# Patient Record
Sex: Female | Born: 1975 | Race: White | Hispanic: No | Marital: Single | Smoking: Never smoker
Health system: Southern US, Community
[De-identification: ages and names within clinical notes are randomized; demographics above are authoritative.]

## PROBLEM LIST (undated history)

## (undated) DIAGNOSIS — I2699 Other pulmonary embolism without acute cor pulmonale: Secondary | ICD-10-CM

## (undated) DIAGNOSIS — E785 Hyperlipidemia, unspecified: Secondary | ICD-10-CM

## (undated) DIAGNOSIS — E282 Polycystic ovarian syndrome: Secondary | ICD-10-CM

## (undated) DIAGNOSIS — I10 Essential (primary) hypertension: Secondary | ICD-10-CM

## (undated) DIAGNOSIS — E669 Obesity, unspecified: Secondary | ICD-10-CM

## (undated) DIAGNOSIS — F32A Depression, unspecified: Secondary | ICD-10-CM

## (undated) DIAGNOSIS — F329 Major depressive disorder, single episode, unspecified: Secondary | ICD-10-CM

## (undated) HISTORY — DX: Depression, unspecified: F32.A

## (undated) HISTORY — PX: WISDOM TOOTH EXTRACTION: SHX21

## (undated) HISTORY — DX: Hyperlipidemia, unspecified: E78.5

## (undated) HISTORY — DX: Polycystic ovarian syndrome: E28.2

## (undated) HISTORY — DX: Essential (primary) hypertension: I10

## (undated) HISTORY — DX: Obesity, unspecified: E66.9

## (undated) HISTORY — PX: CHOLECYSTECTOMY: SHX55

## (undated) HISTORY — PX: BREAST SURGERY: SHX581

## (undated) HISTORY — DX: Other pulmonary embolism without acute cor pulmonale: I26.99

## (undated) HISTORY — DX: Major depressive disorder, single episode, unspecified: F32.9

---

## 1999-10-02 ENCOUNTER — Emergency Department (HOSPITAL_COMMUNITY): Admission: EM | Admit: 1999-10-02 | Discharge: 1999-10-03 | Payer: Self-pay | Admitting: *Deleted

## 1999-10-05 ENCOUNTER — Observation Stay (HOSPITAL_COMMUNITY): Admission: RE | Admit: 1999-10-05 | Discharge: 1999-10-06 | Payer: Self-pay | Admitting: Surgery

## 1999-10-05 ENCOUNTER — Encounter (INDEPENDENT_AMBULATORY_CARE_PROVIDER_SITE_OTHER): Payer: Self-pay | Admitting: Specialist

## 2001-03-04 ENCOUNTER — Other Ambulatory Visit: Admission: RE | Admit: 2001-03-04 | Discharge: 2001-03-04 | Payer: Self-pay | Admitting: *Deleted

## 2001-06-10 ENCOUNTER — Emergency Department (HOSPITAL_COMMUNITY): Admission: EM | Admit: 2001-06-10 | Discharge: 2001-06-10 | Payer: Self-pay | Admitting: Emergency Medicine

## 2001-06-10 ENCOUNTER — Encounter: Payer: Self-pay | Admitting: Emergency Medicine

## 2005-04-12 ENCOUNTER — Other Ambulatory Visit: Admission: RE | Admit: 2005-04-12 | Discharge: 2005-04-12 | Payer: Self-pay | Admitting: *Deleted

## 2006-03-26 ENCOUNTER — Other Ambulatory Visit: Admission: RE | Admit: 2006-03-26 | Discharge: 2006-03-26 | Payer: Self-pay | Admitting: *Deleted

## 2009-04-05 LAB — CONVERTED CEMR LAB
HDL: 27 mg/dL
LDL Cholesterol: 70 mg/dL

## 2009-04-12 LAB — CONVERTED CEMR LAB: Pap Smear: NORMAL

## 2009-04-28 ENCOUNTER — Encounter: Payer: Self-pay | Admitting: Family Medicine

## 2009-04-29 ENCOUNTER — Encounter: Payer: Self-pay | Admitting: Family Medicine

## 2009-05-02 ENCOUNTER — Encounter: Payer: Self-pay | Admitting: Family Medicine

## 2009-07-08 ENCOUNTER — Ambulatory Visit: Payer: Self-pay | Admitting: Family Medicine

## 2009-07-08 DIAGNOSIS — E282 Polycystic ovarian syndrome: Secondary | ICD-10-CM

## 2009-07-08 DIAGNOSIS — F331 Major depressive disorder, recurrent, moderate: Secondary | ICD-10-CM | POA: Insufficient documentation

## 2009-07-08 DIAGNOSIS — E1169 Type 2 diabetes mellitus with other specified complication: Secondary | ICD-10-CM | POA: Insufficient documentation

## 2009-07-08 DIAGNOSIS — E119 Type 2 diabetes mellitus without complications: Secondary | ICD-10-CM | POA: Insufficient documentation

## 2009-07-08 DIAGNOSIS — E1129 Type 2 diabetes mellitus with other diabetic kidney complication: Secondary | ICD-10-CM

## 2009-07-08 DIAGNOSIS — F329 Major depressive disorder, single episode, unspecified: Secondary | ICD-10-CM | POA: Insufficient documentation

## 2009-07-12 LAB — CONVERTED CEMR LAB
BUN: 13 mg/dL (ref 6–23)
CO2: 28 meq/L (ref 19–32)
Calcium: 8.7 mg/dL (ref 8.4–10.5)
Chloride: 102 meq/L (ref 96–112)
Creatinine, Ser: 0.7 mg/dL (ref 0.4–1.2)
GFR calc non Af Amer: 104.44 mL/min (ref >60)
Glucose, Bld: 229 mg/dL — ABNORMAL HIGH (ref 70–99)
Hgb A1c MFr Bld: 10.1 % — ABNORMAL HIGH (ref 4.6–6.5)
Potassium: 4.6 meq/L (ref 3.5–5.1)
Sodium: 138 meq/L (ref 135–145)

## 2009-07-19 ENCOUNTER — Ambulatory Visit: Payer: Self-pay | Admitting: Psychology

## 2009-08-03 ENCOUNTER — Encounter: Payer: Self-pay | Admitting: Family Medicine

## 2009-08-03 ENCOUNTER — Ambulatory Visit: Payer: Self-pay | Admitting: Psychology

## 2009-08-03 ENCOUNTER — Ambulatory Visit: Payer: Self-pay | Admitting: Family Medicine

## 2009-08-10 ENCOUNTER — Ambulatory Visit: Payer: Self-pay | Admitting: Psychology

## 2009-08-10 ENCOUNTER — Ambulatory Visit: Payer: Self-pay | Admitting: Family Medicine

## 2009-08-23 ENCOUNTER — Ambulatory Visit: Payer: Self-pay | Admitting: Psychology

## 2009-09-07 ENCOUNTER — Ambulatory Visit: Payer: Self-pay | Admitting: Psychology

## 2009-09-22 ENCOUNTER — Telehealth: Payer: Self-pay | Admitting: Family Medicine

## 2009-09-29 ENCOUNTER — Encounter: Payer: Self-pay | Admitting: Family Medicine

## 2009-10-04 ENCOUNTER — Ambulatory Visit: Payer: Self-pay | Admitting: Psychology

## 2009-10-20 ENCOUNTER — Ambulatory Visit: Payer: Self-pay | Admitting: Family Medicine

## 2009-10-21 LAB — CONVERTED CEMR LAB: Hgb A1c MFr Bld: 6.4 % (ref 4.6–6.5)

## 2009-10-24 ENCOUNTER — Encounter: Payer: Self-pay | Admitting: Family Medicine

## 2009-10-24 LAB — CONVERTED CEMR LAB: TSH: 3.21 microintl units/mL (ref 0.35–5.50)

## 2009-10-31 ENCOUNTER — Encounter: Payer: Self-pay | Admitting: Family Medicine

## 2009-11-08 ENCOUNTER — Telehealth: Payer: Self-pay | Admitting: Family Medicine

## 2009-11-08 ENCOUNTER — Ambulatory Visit: Payer: Self-pay | Admitting: Psychology

## 2009-11-28 ENCOUNTER — Telehealth: Payer: Self-pay | Admitting: Family Medicine

## 2009-12-13 ENCOUNTER — Ambulatory Visit: Payer: Self-pay | Admitting: Psychology

## 2010-01-24 ENCOUNTER — Other Ambulatory Visit: Payer: Self-pay | Admitting: Family Medicine

## 2010-01-24 ENCOUNTER — Ambulatory Visit
Admission: RE | Admit: 2010-01-24 | Discharge: 2010-01-24 | Payer: Self-pay | Source: Home / Self Care | Attending: Family Medicine | Admitting: Family Medicine

## 2010-01-24 DIAGNOSIS — R5383 Other fatigue: Secondary | ICD-10-CM

## 2010-01-24 DIAGNOSIS — R5381 Other malaise: Secondary | ICD-10-CM | POA: Insufficient documentation

## 2010-01-24 LAB — CBC WITH DIFFERENTIAL/PLATELET
Basophils Absolute: 0 10*3/uL (ref 0.0–0.1)
Basophils Relative: 0.5 % (ref 0.0–3.0)
Eosinophils Absolute: 0.2 10*3/uL (ref 0.0–0.7)
Eosinophils Relative: 2 % (ref 0.0–5.0)
HCT: 29.6 % — ABNORMAL LOW (ref 36.0–46.0)
Hemoglobin: 9.6 g/dL — ABNORMAL LOW (ref 12.0–15.0)
Lymphocytes Relative: 24.5 % (ref 12.0–46.0)
Lymphs Abs: 1.9 10*3/uL (ref 0.7–4.0)
MCHC: 32.4 g/dL (ref 30.0–36.0)
MCV: 70 fl — ABNORMAL LOW (ref 78.0–100.0)
Monocytes Absolute: 0.6 10*3/uL (ref 0.1–1.0)
Monocytes Relative: 8.3 % (ref 3.0–12.0)
Neutro Abs: 4.9 10*3/uL (ref 1.4–7.7)
Neutrophils Relative %: 64.7 % (ref 43.0–77.0)
Platelets: 334 10*3/uL (ref 150.0–400.0)
RBC: 4.23 Mil/uL (ref 3.87–5.11)
RDW: 19.4 % — ABNORMAL HIGH (ref 11.5–14.6)
WBC: 7.6 10*3/uL (ref 4.5–10.5)

## 2010-01-24 LAB — HEMOGLOBIN A1C: Hgb A1c MFr Bld: 6.3 % (ref 4.6–6.5)

## 2010-01-24 LAB — BASIC METABOLIC PANEL
BUN: 14 mg/dL (ref 6–23)
CO2: 29 mEq/L (ref 19–32)
Calcium: 9 mg/dL (ref 8.4–10.5)
Chloride: 104 mEq/L (ref 96–112)
Creatinine, Ser: 0.7 mg/dL (ref 0.4–1.2)
GFR: 100.69 mL/min (ref >60.00)
Glucose, Bld: 101 mg/dL — ABNORMAL HIGH (ref 70–99)
Potassium: 4.6 mEq/L (ref 3.5–5.1)
Sodium: 138 mEq/L (ref 135–145)

## 2010-01-24 LAB — TSH: TSH: 3.4 u[IU]/mL (ref 0.35–5.50)

## 2010-02-02 NOTE — Letter (Signed)
Summary: Generic Letter  Kristy Conrad at [facility]  [address] [place], [place] [address]   Phone: [phone]  Fax: [phone]    10/24/2009  Kristy Conrad [address] [place], [place]  [address]  Dear Ms. Dareen Piano,    We have received your lab results and Dr. Dayton Martes says that your thyroid function is normal.  A copy of these labs were faxed to Dr. Elpidio Eric office.         Sincerely,       Linde Gillis CMA (AAMA)for Dr. Ruthe Mannan

## 2010-02-02 NOTE — Progress Notes (Signed)
Summary: Rx Bupropion  Phone Note Refill Request Call back at [phone] Message [facility]:  [facility] on September 22, 2009 8:32 AM  Refills Requested: Medication #1:  WELLBUTRIN SR 150 MG XR12H-TAB 1 tab by mouth every morning.   Last Refilled: 07/08/2009 Received E-script request please advise.     Method Requested: Telephone to Pharmacy Initial call taken by: Linde Gillis CMA Duncan Dull),  September 22, 2009 8:33 AM    Prescriptions: WELLBUTRIN SR 150 MG XR12H-TAB (BUPROPION HCL) 1 tab by mouth every morning.  #30 x 1   Entered and Authorized by:   Ruthe Mannan MD   Signed by:   Ruthe Mannan MD on 09/22/2009   Method used:   [facility] to        [facility] [phone]* (retail)       [address] [place], [place]  [address]       Ph: [phone] or [phone]       Fax: [phone]   RxID:   [id number]

## 2010-02-02 NOTE — Letter (Signed)
Summary: [facility]  [facility]   Imported By: Lanelle Bal 05/16/2009 10:32:53  _____________________________________________________________________  External Attachment:    Type:   Image     Comment:   External Document

## 2010-02-02 NOTE — Assessment & Plan Note (Signed)
Summary: 2 month f/u /alc   Vital Signs:  Patient profile:   35 year old female Height:      64.5 inches Weight:      370.25 pounds BMI:     62.80 Temp:     98.1 degrees F oral Pulse rate:   76 / minute Pulse rhythm:   regular BP sitting:   112 / 72  (right arm) Cuff size:   large  Vitals Entered By: Linde Gillis CMA Duncan Dull) (January 24, 2010 9:44 AM) CC: 2 month follow up for diabetes   History of Present Illness: 35 yo here for follow up.   1.  DM- a1c in October was 6.4 (10.1 prior to that).  On Metformin 500 mg bid.    Has been seeing a nutritionist, very "eye opening."  Stopped drinking large sweet tea- 4 large glasses per day.   Weight down another 7 pounds since last office visit CBGs 95-146, checking them two times a day. Admits to slipping a little on her diet over the holidays.    2.  Fatigue- has noticed she is more fatigued and SOB lately. Sometimes DOE but sometimes SOB at rest.  Has been occuring for several months.  She think she has asthma.  3.  Menorrhagia- followed by Dr. Ernestina Penna.  Now on Lysteda which has helped significantly with her bleeding.    Current Medications (verified): 1)  Metformin Hcl 500 Mg Tabs (Metformin Hcl) .Marland Kitchen.. 1 Tab By Mouth Two Times A Day 2)  Wellbutrin Sr 150 Mg Xr12h-Tab (Bupropion Hcl) .Marland Kitchen.. 1 Tab By Mouth Every Morning. 3)  Onetouch Ultra 2 W/device Kit (Blood Glucose Monitoring Suppl) 4)  Onetouch Ultra Test  Strp (Glucose Blood) .... Use As Directed 5)  Onetouch Ultrasoft Lancets  Misc (Lancets) .... Use As Directed 6)  [facility]vs Acid Reducer 10 Mg Tabs (Famotidine) .... Take One Tablet By Mouth Daily 7)  Lysteda 650 Mg Tabs (Tranexamic Acid) .... Take Two Tablets Three Times A Day During Menstral Cycle 8)  Proair Hfa 108 (90 Base) Mcg/act  Aers (Albuterol Sulfate) .... 2 Inh Q4h As Needed Shortness of Breath  Allergies: 1)  ! 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 Eyes:  Denies blurring. ENT:  Denies difficulty swallowing. CV:  Denies chest pain or discomfort. Resp:  Complains of shortness of breath; denies sputum productive and wheezing. GI:  Denies abdominal pain and change in bowel habits. MS:  Denies joint pain, joint redness, and joint swelling. Psych:  Denies anxiety and depression.  Physical Exam  General:  morbidly obese, NAD weight down 7 pounds Mouth:  good dentition.   Neck:  No deformities, masses, or tenderness noted. Lungs:  Normal respiratory effort, chest expands symmetrically. Lungs are clear to auscultation, no crackles or wheezes. Heart:  Normal rate and regular rhythm. S1 and S2 normal without gallop, murmur, click, rub or other extra sounds. Extremities:  no edema Neurologic:  No cranial nerve deficits noted. Station and gait are normal. Plantar reflexes are down-going bilaterally. DTRs  are symmetrical throughout. Sensory, motor and coordinative functions appear intact. Psych:  Cognition and judgment appear intact. Alert and cooperative with normal attention span and concentration. No apparent delusions, illusions, hallucinations   Impression & Recommendations:  Problem # 1:  FATIGUE (ICD-780.79) Assessment New Likely multifactorial- deconditioning, obesity. She did have significant menstrual bleeding, will therefore check CBC and other labs to rule out other contributing factors. Anxiety is a large componenet as well, will give her a proair inhaler to take with her as  a security in case she does become short of breath. Lungs clear on exam. Orders: Venipuncture (04540) TLB-BMP (Basic Metabolic Panel-BMET) (80048-METABOL) TLB-CBC Platelet - w/Differential (85025-CBCD) TLB-TSH (Thyroid Stimulating Hormone) (84443-TSH)  Problem # 2:  DIABETES MELLITUS, TYPE II (ICD-250.00) Assessment: Unchanged recheck a1c today. Her updated medication list for this problem includes:    Metformin Hcl 500 Mg Tabs (Metformin hcl) .Marland Kitchen... 1 tab by mouth two times a day  Complete Medication List: 1)  Metformin Hcl 500 Mg Tabs (Metformin hcl) .Marland Kitchen.. 1 tab by mouth two times a day 2)  Wellbutrin Sr 150 Mg Xr12h-tab (Bupropion hcl) .Marland Kitchen.. 1 tab by mouth every morning. 3)  Onetouch Ultra 2 W/device Kit (Blood glucose monitoring suppl) 4)  Onetouch Ultra Test Strp (Glucose blood) .... Use as directed 5)  Onetouch Ultrasoft Lancets Misc (Lancets) .... Use as directed 6)  [facility]vs Acid Reducer 10 Mg Tabs (Famotidine) .... Take one tablet by mouth daily 7)  Lysteda 650 Mg Tabs (Tranexamic acid) .... Take two tablets three times a day during menstral cycle 8)  Proair Hfa 108 (90 Base) Mcg/act Aers (Albuterol sulfate) .... 2 inh q4h as needed shortness of breath  Other Orders: TLB-A1C / Hgb A1C (Glycohemoglobin) (83036-A1C) Prescriptions: PROAIR HFA 108 (90 BASE) MCG/ACT  AERS (ALBUTEROL SULFATE) 2 inh q4h as needed shortness of breath  #1 x 1   Entered and Authorized by:   Ruthe Mannan MD   Signed by:   Ruthe Mannan MD on 01/24/2010   Method used:   Electronically to        [facility] [address]* (retail)       [address] [place], [place]  [address]       Ph: [phone]       Fax: [phone]   RxID:   [id number]    Orders Added: 1)  Venipuncture [72536] 2)  TLB-BMP (Basic Metabolic Panel-BMET) [80048-METABOL] 3)  TLB-CBC Platelet - w/Differential [85025-CBCD] 4)  TLB-TSH (Thyroid Stimulating Hormone) [84443-TSH] 5)  TLB-A1C / Hgb A1C  (Glycohemoglobin) [83036-A1C] 6)  Est. Patient Level IV [64403]    Current Allergies (reviewed today): ! MORPHINE

## 2010-02-02 NOTE — Assessment & Plan Note (Signed)
Summary: NEW PT TO ESTABH/DLO   Vital Signs:  Patient profile:   35 year old female Height:      64.5 inches Weight:      392.50 pounds BMI:     66.57 Temp:     98.8 degrees F oral Pulse rate:   80 / minute Pulse rhythm:   regular BP sitting:   120 / 90  (right arm) Cuff size:   large  Vitals Entered By: Linde Gillis CMA Duncan Dull) (July 08, 2009 10:54 AM) CC: new patient, establish care   History of Present Illness: 35 yo here to establish care.  Has not had a PCP since she was a child. Sent here by OBGYN, Dr. Ernestina Penna.  Dr. Ernestina Penna saw her for a pap this year in April, found her to be a diabetic, ?PCOS.  1.  DM- a1c in April was 9.7.  She was given prescription for Metformin, but she never took it.  States that she wanted to see her primary doctor first.  Was given a prescription for a meter and strips but she never filled them either.  Admits to eating large portions and drinking sugary drinks.  Recently started drinking unsweeted tea.  Does admit to being thirsty, urinating frequenty and having frequent vaginal yeast infections.  2.  Depression- lost both of her parents in the same year 11 year ago.  Since then, has had episodes of depression.  Was seeing Dr. Omelia Blackwater and placed on Wellbutrin but she felt better and stopped taking it.  Lately, she has had more crying spells, feelings of anhedonia.  No SI or HI.  3.  ?PCOS- has always had issues with very heavy and irregular periods.  Not in a relationship but was not concerned about fertility risks until now.  Was given prescription for OCP to regulate her periods but she never took them.    Preventive Screening-Counseling & Management  Alcohol-Tobacco     Smoking Status: never  Current Medications (verified): 1)  Metformin Hcl 500 Mg Tabs (Metformin Hcl) .Marland Kitchen.. 1 Tab By Mouth Two Times A Day 2)  Wellbutrin Sr 150 Mg Xr12h-Tab (Bupropion Hcl) .Marland Kitchen.. 1 Tab By Mouth Every Morning. 3)  Onetouch Ultra 2 W/device Kit (Blood Glucose  Monitoring Suppl) 4)  Onetouch Ultra Test  Strp (Glucose Blood) .... Use As Directed 5)  Onetouch Ultrasoft Lancets  Misc (Lancets) .... Use As Directed  Allergies (verified): 1)  ! 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 Eyes:  Denies blurring. ENT:  Denies difficulty swallowing. CV:  Denies chest pain or discomfort. Resp:  Denies shortness of breath. GI:  Denies abdominal pain and change in bowel habits. GU:  Complains of abnormal vaginal bleeding. MS:  Complains of joint pain; denies joint redness, joint swelling, and loss of strength. Derm:  Denies rash. Neuro:  Denies headaches, tingling, tremors, visual disturbances, and weakness. Psych:  Complains  of depression and easily tearful; denies anxiety, easily angered, irritability, mental problems, panic attacks, sense of great danger, suicidal thoughts/plans, thoughts of violence, unusual visions or sounds, and thoughts /plans of harming others. Endo:  Denies cold intolerance and heat intolerance. Heme:  Denies abnormal bruising.  Physical Exam  General:  morbidly obese, NAD Eyes:  vision grossly intact.   Ears:  R ear normal and L ear normal.   Nose:  no external deformity.   Mouth:  good dentition.   Psych:  Cognition and judgment appear intact. Alert and cooperative with normal attention span and  concentration. No apparent delusions, illusions, hallucinations   Impression & Recommendations:  Problem # 1:  DIABETES MELLITUS, TYPE II (ICD-250.00) Assessment New Will recheck a1c today. Time spent with patient 45 minutes, more than 50% of this time was spent counseling patient on diabetes, management and diet along with depression. Given diabetic friendly diet along with one touch ultra 2 kit.  Discussed how to use.  Would advise checking her sugars daily at first, eventually a few times a week.   Will start Metformin 500 mg two times a day and have her follow up in one month. Will aslo refer for diabetic nutrition counselling. Her updated medication list for this problem includes:    Metformin Hcl 500 Mg Tabs (Metformin hcl) .Marland Kitchen... 1 tab by mouth two times a day  Problem # 2:  POLYCYSTIC OVARIES (ICD-256.4) Assessment: Unchanged with menorrhagia.  Discussed starting out with Metformin and using OCPs once we have worked on her diabetes and weight managment.  Pt in agreement with plan.  Not currently sexually active.  Problem # 3:  DEPRESSION, MAJOR, RECURRENT, MODERATE (ICD-296.32) Assessment: Deteriorated Start Wellbutrin 150 mg daily and follow up in one month. Refer to Dr. Laymond Purser for psychotherapy as well.  Complete Medication List: 1)  Metformin Hcl 500 Mg Tabs (Metformin hcl) .Marland Kitchen.. 1 tab by mouth two times a day 2)  Wellbutrin Sr 150 Mg Xr12h-tab (Bupropion hcl) .Marland Kitchen.. 1 tab by mouth every morning. 3)  Onetouch Ultra 2 W/device Kit (Blood glucose monitoring suppl) 4)  Onetouch Ultra Test Strp (Glucose blood) .... Use as directed 5)  Onetouch Ultrasoft Lancets Misc (Lancets) .... Use as directed  Other Orders: Venipuncture ([address]) TLB-BMP (Basic Metabolic Panel-BMET) (80048-METABOL) TLB-A1C / Hgb A1C (Glycohemoglobin) (83036-A1C) Psychology Referral (Psychology) Diabetic Clinic Referral (Diabetic)  Patient Instructions: 1)  It was so nice to meet you. 2)  Please make an  appointment to see me in one month. 3)  Stop by to see Shirlee Limerick on your way out to set up your referrals. Prescriptions: ONETOUCH ULTRASOFT LANCETS  MISC (LANCETS) Use as directed  #100 x 3   Entered and Authorized by:   Ruthe Mannan MD   Signed by:   Ruthe Mannan MD on 07/08/2009   Method used:   [facility] to        [facility] [phone]* (retail)       [address] [place], [place]  [address]       Ph: [phone] or [phone]       Fax: [phone]   RxID:   [id number] [facility] ULTRA TEST  STRP (GLUCOSE BLOOD) Use as directed  #100 x 3   Entered and Authorized by:   Ruthe Mannan MD   Signed by:   Ruthe Mannan MD on 07/08/2009   Method used:   [facility]ron[facility] to  [facility] [phone]* (retail)       [address] [place], [place]  [address]       Ph: [phone] or [phone]       Fax: [phone]   RxID:   [id number] WELLBUTRIN SR 150 MG XR12H-TAB (BUPROPION HCL) 1 tab by mouth every morning.  #30 x 1   Entered and Authorized by:   Ruthe Mannan MD   Signed by:   Ruthe Mannan MD on 07/08/2009   Method used:   [facility]ronically to        [facility] [phone]* (retail)       [address] [place], [place]  [address]       Ph: [phone] or [phone]       Fax: [phone]   RxID:   [id number] METFORMIN HCL 500 MG TABS (METFORMIN HCL) 1 tab by mouth two times a day  #60 x 3   Entered and Authorized by:   Ruthe Mannan MD   Signed by:   Ruthe Mannan MD on 07/08/2009   Method used:   [facility] to        [facility] [phone]* (retail)       [address] [place], [place]  [address]       Ph: [phone] or [phone]       Fax: [phone]   RxID:   [id number]   Prior Medications (reviewed today): None Current Allergies (reviewed today): ! MORPHINE  TD Result Date:  07/07/2008 TD Result:  historical TD Next Due:  10 yr HDL Result Date:   04/05/2009 HDL Result:  27 HDL Next Due:  1 yr LDL Result Date:  04/05/2009 LDL Result:  70 LDL Next Due:  1 yr PAP Result Date:  04/12/2009 PAP Result:  normal PAP Next Due:  2 yr

## 2010-02-02 NOTE — Letter (Signed)
Summary: [facility]  [facility]   Imported By: Lanelle Bal 05/16/2009 10:33:35  _____________________________________________________________________  External Attachment:    Type:   Image     Comment:   External Document

## 2010-02-02 NOTE — Assessment & Plan Note (Signed)
Summary: ROA FOR FOLLOW-UP/JRR   Vital Signs:  Patient profile:   35 year old female Height:      64.5 inches Weight:      377 pounds BMI:     55.46 Temp:     98.5 degrees F oral Pulse rate:   84 / minute Pulse rhythm:   regular BP sitting:   112 / 62  (right arm) Cuff size:   large  Vitals Entered By: Linde Gillis CMA Duncan Dull) (October 20, 2009 3:52 PM) CC: follow up diabetes   History of Present Illness: 35 yo here for follow up.   1.  DM- a1c in April was 9.7, 10.1 last month iun 07/2009.  On Metformin 500 mg bid.    Has been seeing a nutritionist, very "eye opening."  Stopped drinking large sweet tea- 4 large glasses per day.   Weight down 10 pounds. CBGs 90-120, checking them two times a day.   2.  Depression- lost both of her parents in the same year 11 year ago.  Since then, has had episodes of depression.  Started Wellbutrin 150 mg daily in July.  She feels a great improvement.  Also seeing Dr. Laymond Purser once a month.    3.  ?PCOS- has always had issues with very heavy and irregular periods.  Not in a relationship but was not concerned about fertility risks until now.   Periods improved since taking Metformin but not as good as she would like.  Has appt for U/S scheduled with Dr. Payton Doughty later this month.  Current Medications (verified): 1)  Metformin Hcl 500 Mg Tabs (Metformin Hcl) .Marland Kitchen.. 1 Tab By Mouth Two Times A Day 2)  Wellbutrin Sr 150 Mg Xr12h-Tab (Bupropion Hcl) .Marland Kitchen.. 1 Tab By Mouth Every Morning. 3)  Onetouch Ultra 2 W/device Kit (Blood Glucose Monitoring Suppl) 4)  Onetouch Ultra Test  Strp (Glucose Blood) .... Use As Directed 5)  Onetouch Ultrasoft Lancets  Misc (Lancets) .... Use As Directed 6)  Cvs Acid Reducer 10 Mg Tabs (Famotidine) .... Take One Tablet By Mouth Daily  Allergies: 1)  ! 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 Eyes:  Denies blurring. Endo:  Complains of weight change; denies cold intolerance, excessive thirst, heat intolerance, and polyuria.  Physical Exam  General:  morbidly obese, NAD weight down 10 pounds Mouth:  good dentition.   Lungs:  Normal respiratory effort, chest expands symmetrically. Lungs are clear to auscultation, no crackles or wheezes. Heart:  Normal rate and regular rhythm. S1 and S2 normal without gallop, murmur, click, rub or other extra sounds. Extremities:  no edema Psych:  Cognition and judgment appear intact. Alert and cooperative with normal attention span and concentration. No apparent delusions, illusions, hallucinations   Impression & Recommendations:  Problem # 1:  DIABETES MELLITUS, TYPE II (ICD-250.00) Assessment Unchanged recheck a1c today. Her updated medication list for this problem includes:    Metformin Hcl 500 Mg Tabs (Metformin hcl) .Marland Kitchen... 1 tab by mouth two times a day  Orders: Venipuncture (04540) TLB-A1C / Hgb  A1C (Glycohemoglobin) (83036-A1C)  Problem # 2:  DEPRESSION (ICD-311) Assessment: Improved COntinue current dose of Wellbutrin, along with psychotherapy with Dr. Laymond Purser. Her updated medication list for this problem includes:    Wellbutrin Sr 150 Mg Xr12h-tab (Bupropion hcl) .Marland Kitchen... 1 tab by mouth every morning.  Problem # 3:  POLYCYSTIC OVARIES (ICD-256.4) Assessment: Unchanged TSH today. Follow up after [place] with Dr. Payton Doughty. Orders: TLB-TSH (Thyroid Stimulating Hormone) (84443-TSH)  Complete Medication List: 1)  Metformin Hcl 500 Mg Tabs (Metformin hcl) .Marland Kitchen.. 1 tab by  mouth two times a day 2)  Wellbutrin Sr 150 Mg Xr12h-tab (Bupropion hcl) .Marland Kitchen.. 1 tab by mouth every morning. 3)  Onetouch Ultra 2 W/device Kit (Blood glucose monitoring suppl) 4)  Onetouch Ultra Test Strp (Glucose blood) .... Use as directed 5)  Onetouch Ultrasoft Lancets Misc (Lancets) .... Use as directed 6)  Cvs Acid Reducer 10 Mg Tabs (Famotidine) .... Take one tablet by mouth daily   Orders Added: 1)  Venipuncture [36415] 2)  TLB-A1C / Hgb A1C (Glycohemoglobin) [83036-A1C] 3)  TLB-TSH (Thyroid Stimulating Hormone) [84443-TSH] 4)  Est. Patient Level IV [91478]    Current Allergies (reviewed today): ! MORPHINE Flu Vaccine Result Date:  10/12/2009 Flu Vaccine Result:  historical   Appended Document: ROA FOR FOLLOW-UP/JRR     Allergies: 1)  ! Morphine   Complete Medication List: 1)  Metformin Hcl 500 Mg Tabs (Metformin hcl) .Marland Kitchen.. 1 tab by mouth two times a day 2)  Wellbutrin Sr 150 Mg Xr12h-tab (Bupropion hcl) .Marland Kitchen.. 1 tab by mouth every morning. 3)  Onetouch Ultra 2 W/device Kit (Blood glucose monitoring suppl) 4)  Onetouch Ultra Test Strp (Glucose blood) .... Use as directed 5)  Onetouch Ultrasoft Lancets Misc (Lancets) .... Use as directed 6)  Cvs Acid Reducer 10 Mg Tabs (Famotidine) .... Take one tablet by mouth daily  Other Orders: Pneumococcal Vaccine (29562) Admin 1st Vaccine (13086)   Orders Added: 1)  Pneumococcal Vaccine [90732] 2)  Admin 1st Vaccine [57846]   Immunizations Administered:  Pneumonia Vaccine:    Vaccine Type: Pneumovax    Site: right deltoid    Mfr: Merck    Dose: 0.5 ml    Route: IM    Given by: Linde Gillis CMA (AAMA)    Exp. Date: 03/16/2011    Lot #: [id number]    VIS given: 12/06/08 version given October 20, 2009.   Immunizations Administered:  Pneumonia Vaccine:    Vaccine Type: Pneumovax    Site: right deltoid    Mfr: Merck    Dose: 0.5 ml    Route: IM    Given by: Linde Gillis CMA (AAMA)    Exp. Date: 03/16/2011    Lot  #: [id number]    VIS given: 12/06/08 version given October 20, 2009.

## 2010-02-02 NOTE — Letter (Signed)
Summary: [facility]  [facility]   Imported By: Lanelle Bal 11/07/2009 08:48:05  _____________________________________________________________________  External Attachment:    Type:   Image     Comment:   External Document

## 2010-02-02 NOTE — Letter (Signed)
Summary: [facility]  [facility]   Imported By: Lanelle Bal 10/10/2009 09:39:49  _____________________________________________________________________  External Attachment:    Type:   Image     Comment:   External Document

## 2010-02-02 NOTE — Assessment & Plan Note (Signed)
Summary: 1 MONTH FOLLOW UP/[facility]   Vital Signs:  Patient profile:   35 year old female Height:      64.5 inches Weight:      386 pounds BMI:     65.47 Temp:     98.8 degrees F oral Pulse rate:   72 / minute Pulse rhythm:   regular BP sitting:   110 / 70  (right arm) Cuff size:   large  Vitals Entered By: Linde Gillis CMA Duncan Dull) (August 10, 2009 11:36 AM) CC: One month follow up   History of Present Illness: 35 yo here to establish care.  Has not had a PCP since she was a child.   1.  DM- a1c in April was 9.7, 10.1 last month.  Started Metformin 500 mg bid last months.  Has been seeing a nutritionist, very "eye opening."  Stopped drinking large sweet tea- 4 large glasses per day.   Weight down two pounds.   CBGs 140-160, checking them two times a day.   2.  Depression- lost both of her parents in the same year 11 year ago.  Since then, has had episodes of depression.  Was seeing Dr. Omelia Blackwater and placed on Wellbutrin but she felt better and stopped taking it.  Restarted wellbutrin last month and seeing Dr. Laymond Purser.  Feels less tearful, better overall.    3.  ?PCOS- has always had issues with very heavy and irregular periods.  Not in a relationship but was not concerned about fertility risks until now.  Was given prescription for OCP to regulate her periods but she never took them.  Periods improved since taking Metformin.  Current Medications (verified): 1)  Metformin Hcl 500 Mg Tabs (Metformin Hcl) .Marland Kitchen.. 1 Tab By Mouth Two Times A Day 2)  Wellbutrin Sr 150 Mg Xr12h-Tab (Bupropion Hcl) .Marland Kitchen.. 1 Tab By Mouth Every Morning. 3)  Onetouch Ultra 2 W/device Kit (Blood Glucose Monitoring Suppl) 4)  Onetouch Ultra Test  Strp (Glucose Blood) .... Use As Directed 5)  Onetouch Ultrasoft Lancets  Misc (Lancets) .... Use As Directed  Allergies: 1)  ! 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 Never Smoked Alcohol use-yes  Risk Factors: Smoking Status: never (07/08/2009)  Review of Systems      See HPI General:  Denies malaise. Eyes:  Denies blurring. ENT:  Denies difficulty swallowing. CV:  Denies chest pain or discomfort. Resp:  Denies shortness of breath. GI:  Denies abdominal pain, nausea, and vomiting. GU:  Denies incontinence.  Physical Exam  General:  morbidly obese, NAD weight down 2 pounds Mouth:  good dentition.   Psych:  Cognition and judgment appear intact. Alert and cooperative with normal attention span and concentration. No apparent delusions, illusions, hallucinations   Impression & Recommendations:  Problem # 1:  DIABETES MELLITUS, TYPE II (ICD-250.00) Assessment Unchanged Time spent with patient 35 minutes, more than 50% of this time was spent counseling patient on diabetes. Will continue with Metformin at current dose, along with routine visits with nutritionist.  Follow up in 2 months.  Her updated medication list for this problem includes:    Metformin Hcl 500 Mg Tabs (Metformin hcl) .Marland Kitchen... 1 tab by mouth two  times a day  Complete Medication List: 1)  Metformin Hcl 500 Mg Tabs (Metformin hcl) .Marland Kitchen.. 1 tab by mouth two times a day 2)  Wellbutrin Sr 150 Mg Xr12h-tab (Bupropion hcl) .Marland Kitchen.. 1 tab by mouth every morning. 3)  Onetouch Ultra 2 W/device Kit (Blood glucose monitoring suppl) 4)  Onetouch Ultra Test Strp (Glucose blood) .... Use as directed 5)  Onetouch Ultrasoft Lancets Misc (Lancets) .... Use as directed  Patient Instructions: 1)  Good to see you. 2)  I am so proud of you. 3)  Please make an appointment to see me in 2 months.  Current Allergies (reviewed today): ! MORPHINE

## 2010-02-02 NOTE — Progress Notes (Signed)
Summary: metformin,[facility]  Phone Note Refill Request Message from:  Patient on November 28, 2009 11:47 AM  Refills Requested: Medication #1:  METFORMIN HCL 500 MG TABS 1 tab by mouth two times a day  Medication #2:  WELLBUTRIN SR 150 MG XR12H-TAB 1 tab by mouth every morning. Patient would like 3 month supply sent to [facility] in [place].    Initial call taken by: Melody Comas,  November 28, 2009 11:54 AM Caller: Patient    Prescriptions: WELLBUTRIN SR 150 MG XR12H-TAB (BUPROPION HCL) 1 tab by mouth every morning.  #90 x 3   Entered and Authorized by:   Ruthe Mannan MD   Signed by:   Ruthe Mannan MD on 11/28/2009   Method used:   Electronically to        [facility] [address] (retail)       [address] [place], [place]  [address]       Ph: [phone]       Fax: [phone]   RxID:   [id number] METFORMIN HCL 500 MG TABS (METFORMIN HCL) 1 tab by mouth two times a day  #180 x 3   Entered and Authorized by:   Ruthe Mannan MD   Signed by:   Ruthe Mannan MD on 11/28/2009   Method used:   Electronically to        [facility] [address] (retail)       [address] [place], [place]  [address]       Ph: [phone]       Fax: [phone]   RxID:   [id number]

## 2010-02-02 NOTE — Consult Note (Signed)
Summary: [facility]  [facility]   Imported By: Lanelle Bal 08/12/2009 10:17:14  _____________________________________________________________________  External Attachment:    Type:   Image     Comment:   External Document

## 2010-02-02 NOTE — Progress Notes (Signed)
Summary: needs written scripts  Phone Note Refill Request   Refills Requested: Medication #1:  WELLBUTRIN SR 150 MG XR12H-TAB 1 tab by mouth every morning.  Medication #2:  METFORMIN HCL 500 MG TABS 1 tab by mouth two times a day Patient is asking for written scripts for these because she is changing pharmacies. She is coming in this afternoon to see Dr. Jerilee Field and would like to pick them up while she is here. Patient is also asking if she could get these in a 90 day supply.   Initial call taken by: Melody Comas,  November 08, 2009 11:41 AM    Prescriptions: METFORMIN HCL 500 MG TABS (METFORMIN HCL) 1 tab by mouth two times a day  #180 x 3   Entered and Authorized by:   Ruthe Mannan MD   Signed by:   Ruthe Mannan MD on 11/08/2009   Method used:   Print then Give to Patient   RxID:   [id number] [facility] SR 150 MG XR12H-TAB (BUPROPION HCL) 1 tab by mouth every morning.  #90 x 3   Entered and Authorized by:   Ruthe Mannan MD   Signed by:   Ruthe Mannan MD on 11/08/2009   Method used:   Print then Give to Patient   RxID:   [id number]   Appended Document: needs written scripts Rx's left at front desk for patient pick up.

## 2010-02-02 NOTE — Letter (Signed)
Summary: [facility]  [facility]   Imported By: Lanelle Bal 05/16/2009 10:34:25  _____________________________________________________________________  External Attachment:    Type:   Image     Comment:   External Document

## 2010-02-07 ENCOUNTER — Encounter: Payer: Self-pay | Admitting: Family Medicine

## 2010-03-30 ENCOUNTER — Ambulatory Visit: Payer: Self-pay | Admitting: Family Medicine

## 2010-04-10 ENCOUNTER — Ambulatory Visit: Payer: Self-pay | Admitting: Family Medicine

## 2010-04-12 ENCOUNTER — Encounter: Payer: Self-pay | Admitting: Family Medicine

## 2010-04-12 ENCOUNTER — Ambulatory Visit (INDEPENDENT_AMBULATORY_CARE_PROVIDER_SITE_OTHER): Payer: BC Managed Care – PPO | Admitting: Family Medicine

## 2010-04-12 DIAGNOSIS — F3289 Other specified depressive episodes: Secondary | ICD-10-CM

## 2010-04-12 DIAGNOSIS — R5381 Other malaise: Secondary | ICD-10-CM

## 2010-04-12 DIAGNOSIS — F329 Major depressive disorder, single episode, unspecified: Secondary | ICD-10-CM

## 2010-04-12 DIAGNOSIS — E119 Type 2 diabetes mellitus without complications: Secondary | ICD-10-CM

## 2010-04-12 DIAGNOSIS — R5383 Other fatigue: Secondary | ICD-10-CM

## 2010-04-12 LAB — HEMOGLOBIN A1C: Hgb A1c MFr Bld: 6.4 % (ref 4.6–6.5)

## 2010-04-12 NOTE — Progress Notes (Signed)
35 yo here for follow up.   1.  DM-  Lab Results  Component Value Date   HGBA1C 6.3 01/24/2010    On Metformin 500 mg bid.    Has been seeing a nutritionist, very "eye opening."  Stopped drinking large sweet tea- 4 large glasses per day.   Wt Readings from Last 3 Encounters:  04/12/10 370 lb (167.831 kg)  01/24/10 370 lb 4 oz (167.944 kg)  10/20/09 377 lb (171.006 kg)   CBGs 95- 130s, checking them two times a day.    3.  Menorrhagia- followed by Dr. Ernestina Penna.  Now on Lysteda which has helped significantly with her bleeding.   Less fatigue.  The PMH, PSH, Social History, Family History, Medications, and allergies have been reviewed in [facility], and have been updated if relevant.   Review of Systems       See HPI General:  Complains of fatigue; denies fever. Eyes:  Denies blurring. ENT:  Denies difficulty swallowing. CV:  Denies chest pain or discomfort. Resp:  Complains of shortness of breath; denies sputum productive and wheezing. GI:  Denies abdominal pain and change in bowel habits. MS:  Denies joint pain, joint redness, and joint swelling. Psych:  Denies anxiety and depression.  Physical Exam BP 124/78  Pulse 65  Temp(Src) 98.5 F (36.9 C) (Oral)  Ht 5' 4.5" (1.638 m)  Wt 370 lb (167.831 kg)  BMI 62.53 kg/m2  LMP 04/03/2010  General:  morbidly obese, NAD Mouth:  good dentition.   Neck:  No deformities, masses, or tenderness noted. Lungs:  Normal respiratory effort, chest expands symmetrically. Lungs are clear to auscultation, no crackles or wheezes. Heart:  Normal rate and regular rhythm. S1 and S2 normal without gallop, murmur, click, rub or other extra sounds. Extremities:  no edema Neurologic:  No cranial nerve deficits noted. Station and gait are normal. Plantar reflexes are down-going bilaterally. DTRs are symmetrical throughout. Sensory, motor and coordinative functions appear intact. Psych:  Cognition and judgment appear intact. Alert and cooperative with normal  attention span and concentration. No apparent delusions, illusions, hallucinations

## 2010-04-12 NOTE — Assessment & Plan Note (Addendum)
Stable.  Recheck a1c today. Continue current meds. Advised restarting her diet as continued weight loss will be essential to improve her health.

## 2010-05-19 NOTE — Op Note (Signed)
[facility]  Patient:    Kristy Conrad, Kristy Conrad                     MRN: [id number] Proc. Date: 10/05/99 Adm. Date:  [id number] Attending:  Charlton Haws                           Operative Report  ACCOUNT NUMBER:  [id number].  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  OPERATION:  Laparoscopic cholecystectomy.  SURGEON:  Currie Paris, M.D.  ASSISTANT:  Donnie Coffin. Samuella Cota, M.D.  ANESTHESIA:  General endotracheal.  CLINICAL HISTORY:  This patient is a 35 year old who presented to the emergency room two days ago with an acute episode of biliary colic which resolved only after a pain shot.  Gallbladder ultrasound showed a single stone in the neck of the gallbladder.  DESCRIPTION OF PROCEDURE:  The patient was brought to the operating room and after satisfactory general endotracheal anesthesia had been obtained, the abdomen was prepped and draped.  Plain Marcaine 0.25% was used at each incision site.  An umbilical incision was made, retractors were placed and the fascia identified, picked up and opened with a knife.  The peritoneal cavity was entered with a hemostat and a pursestring of 0 Vicryl placed.  The Hasson was introduced and the abdomen insufflated to 15.  Patient was placed in reversed Trendelenburg and tilted to the left side.  An 11-mm trocar was placed in the epigastrium and two 5s laterally.  The gallbladder was retracted over the liver and it was little bit distended but not acutely inflamed.  Some omental adhesions were taken down.  The peritoneum over the cystic duct area was opened up and using the cautery, I opened the peritoneum up a little way on either side of the gallbladder to get a good look into the triangle of Calot.  I was able to dissect around, identified  the cystic duct and saw it going down into the area of the common duct and clearly see the cystic-gallbladder junction.  I could see the cystic artery directly behind it.  There were no other structures in the triangle of Calot that could be identified.  I was using the 30 degree scope because of the patients obesity and made visualization better with that than with a straight-on scope to inspect this area of the triangle of Calot.  Once the anatomy had been confirmed, I put three clips in the cystic duct stay side, one on the go side and divided it.  A small lymphatic was clipped and then the cystic artery dissected out, triple-clipped and divided.  The gallbladder was removed from below to above using coagulation kind of cautery.  Just prior to disconnecting, I made sure the bed appeared to be dry and there appeared to be no problems.  The gallbladder was then brought out the umbilical port.  The abdomen was reinflated and the remaining irrigant suctioned out and we irrigated to make sure there was no bleeding.  Once everything looked okay, the lateral ports were removed and there was no bleeding.  The umbilical port was removed and the pursestring tied down and checked with a camera to make sure we did not incorporate any bowel.  The abdomen was deflated through the epigastric port.  Skin was closed with 4-0 Monocryl subcuticular  plus Steri-Strips.  The patient tolerated the procedure well.  There were no obvious complications.  All counts were correct. DD:  10/05/99 TD:  10/05/99 Job: [id number]

## 2010-05-19 NOTE — H&P (Signed)
[facility]  Patient:    Kristy Conrad, Kristy Conrad                     MRN: [id number] Adm. Date:  [id number] Disc. Date: [id number] Attending:  Ephriam Knuckles H                         History and Physical  CHIEF COMPLAINT:  Abdominal pain.  HISTORY OF PRESENT ILLNESS:  Kristy Conrad is a 35 year old who about 9:30 the evening prior developed severe epigastric and right upper quadrant pain, doubled her over, and provoked a visit to the emergency department.  She had a little nausea, no vomiting, no diarrhea.  She never had any real similar episode to this, although she has noted over the last couple of months a little bit of mild indigestion and discomfort in the epigastric area from time to time, usually postprandially, but nothing very severe.  She has always been able to eat okay and has never had any other significant problems.  PAST MEDICAL HISTORY:  Operations:  She had bilateral breast reduction done several years ago in [place].  MEDICATIONS:  None.  ALLERGIES:  None known.  HABITS:  Does not smoke.  FAMILY HISTORY:  Unremarkable, and no history of gallbladder disease.  REVIEW OF SYSTEMS:  HEENT:  Negative.  CHEST:  No cough or shortness of breath .  HEART:  No history of murmurs, hypertension.  ABDOMEN:  Negative. GENITOURINARY:  Negative.  EXTREMITIES:  Negative.  PHYSICAL EXAMINATION:  GENERAL:  The patient is an obese 35 year old who is alert, oriented, in no distress.  She reports that her pain medicine given at 2:20 has completely relieved her pain and she is now pain-free.  HEENT:  Head is normocephalic.  Eyes nonicteric.  Pupils round and regular.  NECK:  Supple, no masses or thyromegaly.  LUNGS:  Clear to auscultation.  HEART:  Regular.  No murmurs, rubs, or gallops.  ABDOMEN:  Soft.  She does have a little tenderness deep in the right upper quadrant to palpation.  There are no masses.  PELVIC, RECTAL:  Not  done.  EXTREMITIES:  No cyanosis or edema.  LABORATORY DATA:  Normal white count of 9500, hematocrit is 34.  Liver functions are normal.  Ultrasound showed a single gallstones lodged in the gallbladder neck but normal ducts, no gallbladder wall thickening or surrounding fluid.  IMPRESSION:  Episode of biliary colic with cholelithiasis and possibly developing cholecystitis.  RECOMMENDATION:  I think she will need a laparoscopic cholecystectomy fairly soon, although not necessarily today given her normal labs and the fact that her pain has resolved.  PLAN:  It is 6:30 in the morning.  We are going to keep her here for another hour or two to make sure that when the Demerol wears off, pain does not return, and in the meantime investigate potential times to do her, either today or tomorrow or Thursday, depending on OR schedules and whether her pain returns or not.  I discussed the surgery with the patient and the risks, complications, possibly open, etc.  I think all questions have been answered. DD:  10/03/99 TD:  10/03/99 Job: [id number]

## 2010-09-20 ENCOUNTER — Other Ambulatory Visit: Payer: Self-pay | Admitting: *Deleted

## 2010-09-20 MED ORDER — BUPROPION HCL ER (SR) 150 MG PO TB12: ORAL_TABLET | ORAL | Status: DC

## 2010-10-23 ENCOUNTER — Ambulatory Visit (INDEPENDENT_AMBULATORY_CARE_PROVIDER_SITE_OTHER): Payer: BC Managed Care – PPO | Admitting: Family Medicine

## 2010-10-23 DIAGNOSIS — Z0289 Encounter for other administrative examinations: Secondary | ICD-10-CM

## 2010-10-23 DIAGNOSIS — E119 Type 2 diabetes mellitus without complications: Secondary | ICD-10-CM

## 2010-10-23 DIAGNOSIS — F331 Major depressive disorder, recurrent, moderate: Secondary | ICD-10-CM

## 2010-10-23 NOTE — Progress Notes (Signed)
No show

## 2010-10-27 ENCOUNTER — Encounter: Payer: Self-pay | Admitting: Family Medicine

## 2010-10-27 ENCOUNTER — Ambulatory Visit (INDEPENDENT_AMBULATORY_CARE_PROVIDER_SITE_OTHER): Payer: BC Managed Care – PPO | Admitting: Family Medicine

## 2010-10-27 VITALS — BP 130/90 | HR 83 | Temp 98.1°F | Wt 387.5 lb

## 2010-10-27 DIAGNOSIS — Z111 Encounter for screening for respiratory tuberculosis: Secondary | ICD-10-CM

## 2010-10-27 DIAGNOSIS — E119 Type 2 diabetes mellitus without complications: Secondary | ICD-10-CM

## 2010-10-27 DIAGNOSIS — F331 Major depressive disorder, recurrent, moderate: Secondary | ICD-10-CM

## 2010-10-27 DIAGNOSIS — M6740 Ganglion, unspecified site: Secondary | ICD-10-CM | POA: Insufficient documentation

## 2010-10-27 DIAGNOSIS — Z23 Encounter for immunization: Secondary | ICD-10-CM

## 2010-10-27 LAB — HEMOGLOBIN A1C: Hgb A1c MFr Bld: 8 % — ABNORMAL HIGH (ref 4.6–6.5)

## 2010-10-27 NOTE — Patient Instructions (Signed)
Good to see you. Please stop by to see Marion on your way out. 

## 2010-10-27 NOTE — Progress Notes (Signed)
35 yo here for follow up.   1.  DM-  Lab Results  Component Value Date   HGBA1C 6.4 04/12/2010    On Metformin 500 mg bid.   Admits to "falling off the wagon," gaining weight and not checking her blood sugars. Wt Readings from Last 3 Encounters:  10/27/10 387 lb 8 oz (175.769 kg)  04/12/10 370 lb (167.831 kg)  01/24/10 370 lb 4 oz (167.944 kg)    2.  Left wrist pain with growing nodule.  Noticed a very small lump on her lateral left wrist a month ago, growing in size and now painful with movement. Applying pressure on it makes it feel better. No weakness or tingling in wrist, hand or fingers. 3.  Menorrhagia- followed by Dr. Ernestina Penna.  Now on Lysteda which has helped significantly with her bleeding.   Less fatigue.  The PMH, PSH, Social History, Family History, Medications, and allergies have been reviewed in [facility], and have been updated if relevant.   Review of Systems       See HPI No episodes of hypoglycemia.  Physical Exam BP 130/90  Pulse 83  Temp(Src) 98.1 F (36.7 C) (Oral)  Wt 387 lb 8 oz (175.769 kg)  General:  morbidly obese, NAD Mouth:  good dentition.   Neck:  No deformities, masses, or tenderness noted. Lungs:  Normal respiratory effort, chest expands symmetrically. Lungs are clear to auscultation, no crackles or wheezes. Heart:  Normal rate and regular rhythm. S1 and S2 normal without gallop, murmur, click, rub or other extra sounds. Extremities:  Freely movable approx 1 cm sized cyst lateral left wrist, no warmth or erythema. Neurologic:  No cranial nerve deficits noted. Station and gait are normal. Plantar reflexes are down-going bilaterally. DTRs are symmetrical throughout. Sensory, motor and coordinative functions appear intact. Psych:  Cognition and judgment appear intact. Alert and cooperative with normal attention span and concentration. No apparent delusions, illusions, hallucinations  Assessment and Plan: 1. Type II or unspecified type diabetes  mellitus without mention of complication, not stated as uncontrolled   Deteriorated.  Discussed importance of restarting diet and exercise. The patient indicates understanding of these issues and agrees with the plan.  HgB A1c  2. Ganglion cyst  New.  Refer to ortho for further evaluation and tx. Ambulatory referral to Orthopedic Surgery

## 2010-10-30 ENCOUNTER — Other Ambulatory Visit: Payer: Self-pay | Admitting: *Deleted

## 2010-10-30 ENCOUNTER — Ambulatory Visit: Payer: BC Managed Care – PPO | Admitting: *Deleted

## 2010-10-30 DIAGNOSIS — Z139 Encounter for screening, unspecified: Secondary | ICD-10-CM

## 2010-10-30 LAB — TB SKIN TEST: TB Skin Test: NEGATIVE mm

## 2010-10-30 MED ORDER — METFORMIN HCL 500 MG PO TABS: 500.0000 mg | ORAL_TABLET | Freq: Two times a day (BID) | ORAL | Status: DC

## 2010-11-14 ENCOUNTER — Telehealth: Payer: Self-pay | Admitting: Family Medicine

## 2010-11-14 ENCOUNTER — Other Ambulatory Visit: Payer: Self-pay | Admitting: *Deleted

## 2010-11-14 MED ORDER — METFORMIN HCL 1000 MG PO TABS: 1000.0000 mg | ORAL_TABLET | Freq: Two times a day (BID) | ORAL | Status: DC

## 2010-11-14 NOTE — Telephone Encounter (Signed)
Pt requesting a new prescription for Metformin to be sent to pharmacy.  Patient call back is [phone].  Thanks

## 2010-11-15 NOTE — Telephone Encounter (Signed)
Rx was refilled on 11/14/2010 by Jacki Cones.  Left message on cell phone voicemail that Rx was sent to pharmacy on yesterday.

## 2010-11-27 ENCOUNTER — Other Ambulatory Visit: Payer: Self-pay | Admitting: Family Medicine

## 2010-11-28 MED ORDER — METFORMIN HCL 1000 MG PO TABS: 1000.0000 mg | ORAL_TABLET | Freq: Two times a day (BID) | ORAL | Status: DC

## 2010-11-28 NOTE — Telephone Encounter (Signed)
Addended by: Gilmer Mor on: 11/28/2010 12:34 PM   Modules accepted: Orders

## 2011-04-02 ENCOUNTER — Ambulatory Visit (INDEPENDENT_AMBULATORY_CARE_PROVIDER_SITE_OTHER): Payer: BC Managed Care – PPO | Admitting: Family Medicine

## 2011-04-02 ENCOUNTER — Encounter: Payer: Self-pay | Admitting: Family Medicine

## 2011-04-02 VITALS — BP 100/70 | HR 98 | Temp 99.2°F | Wt 375.0 lb

## 2011-04-02 DIAGNOSIS — J069 Acute upper respiratory infection, unspecified: Secondary | ICD-10-CM

## 2011-04-02 DIAGNOSIS — E119 Type 2 diabetes mellitus without complications: Secondary | ICD-10-CM

## 2011-04-02 LAB — HEMOGLOBIN A1C: Hgb A1c MFr Bld: 7.2 % — ABNORMAL HIGH (ref 4.6–6.5)

## 2011-04-02 NOTE — Progress Notes (Signed)
SUBJECTIVE:  Kristy Conrad is a 36 y.o. female who complains of coryza, congestion, sneezing, sore throat, swollen glands, myalgias and headache for 14 days. She denies a history of anorexia, chest pain, shortness of breath, sweats, vomiting and weakness and denies a history of asthma. Patient denies smoke cigarettes. Started feeling much better over the weekend.   Patient Active Problem List  Diagnoses  . Type II or unspecified type diabetes mellitus without mention of complication, not stated as uncontrolled  . POLYCYSTIC OVARIES  . DEPRESSION, MAJOR, RECURRENT, MODERATE  . DEPRESSION  . FATIGUE  . Ganglion cyst   Past Medical History  Diagnosis Date  . Depression   . Diabetes mellitus   . PCOS (polycystic ovarian syndrome)   . Obesity    Past Surgical History  Procedure Date  . Cholecystectomy   . Breast surgery     breast reduction   History  Substance Use Topics  . Smoking status: Never Smoker   . Smokeless tobacco: Not on file  . Alcohol Use: Yes   Family History  Problem Relation Age of Onset  . Heart attack Father 66  . Cancer      adenoid cystic carcinoma   Allergies  Allergen Reactions  . Morphine     REACTION: vomiting   Current Outpatient Prescriptions on File Prior to Visit  Medication Sig Dispense Refill  . buPROPion (WELLBUTRIN SR) 150 MG 12 hr tablet 1 TAB BY MOUTH EVERY MORNING.  30 tablet  6  . famotidine (PEPCID) 10 MG tablet Take 10 mg by mouth daily.        . ferrous sulfate 325 (65 FE) MG tablet Take 325 mg by mouth daily with breakfast.        . glucose blood test strip 1 each by Other route as directed. Use as instructed       . Lancets (ONETOUCH ULTRASOFT) lancets 1 each by Other route as directed. Use as instructed       . metFORMIN (GLUCOPHAGE) 1000 MG tablet Take 1 tablet (1,000 mg total) by mouth 2 (two) times daily with a meal.  60 tablet  6  . tranexamic acid (LYSTEDA) 650 MG TABS Take by mouth. Take two tablets three times a day  during menstrual cycle       . metFORMIN (GLUCOPHAGE) 500 MG tablet 1 TAB BY MOUTH TWO TIMES A DAY  60 tablet  6   The PMH, PSH, Social History, Family History, Medications, and allergies have been reviewed in [facility], and have been updated if relevant.  OBJECTIVE: BP 100/70  Pulse 98  Temp(Src) 99.2 F (37.3 C) (Oral)  Wt 375 lb (170.099 kg) Wt Readings from Last 3 Encounters:  04/02/11 375 lb (170.099 kg)  10/27/10 387 lb 8 oz (175.769 kg)  04/12/10 370 lb (167.831 kg)    She appears well, vital signs are as noted. Ears normal.  Throat and pharynx normal.  Neck supple. No adenopathy in the neck. Nose is congested. Sinuses non tender. The chest is clear, without wheezes or rales.  ASSESSMENT:  viral upper respiratory illness  PLAN: Symptomatic therapy suggested: push fluids, rest and return office visit prn if symptoms persist or worsen. Lack of antibiotic effectiveness discussed with her. Call or return to clinic prn if these symptoms worsen or fail to improve as anticipated.

## 2011-04-02 NOTE — Patient Instructions (Signed)
I think this was a likely a virus. We will call you with your lab result from today. If it all looks good, we can follow up in 3-6 months.

## 2011-06-14 ENCOUNTER — Other Ambulatory Visit: Payer: Self-pay | Admitting: Family Medicine

## 2011-08-12 ENCOUNTER — Other Ambulatory Visit: Payer: Self-pay | Admitting: Family Medicine

## 2011-09-12 ENCOUNTER — Other Ambulatory Visit: Payer: Self-pay | Admitting: Family Medicine

## 2011-11-12 ENCOUNTER — Encounter: Payer: Self-pay | Admitting: Family Medicine

## 2011-11-12 ENCOUNTER — Ambulatory Visit (INDEPENDENT_AMBULATORY_CARE_PROVIDER_SITE_OTHER): Payer: BC Managed Care – PPO | Admitting: Family Medicine

## 2011-11-12 VITALS — BP 128/82 | HR 88 | Temp 98.9°F | Wt 385.0 lb

## 2011-11-12 DIAGNOSIS — F331 Major depressive disorder, recurrent, moderate: Secondary | ICD-10-CM

## 2011-11-12 DIAGNOSIS — Z136 Encounter for screening for cardiovascular disorders: Secondary | ICD-10-CM

## 2011-11-12 DIAGNOSIS — E119 Type 2 diabetes mellitus without complications: Secondary | ICD-10-CM

## 2011-11-12 DIAGNOSIS — Z23 Encounter for immunization: Secondary | ICD-10-CM

## 2011-11-12 MED ORDER — PHENTERMINE HCL 15 MG PO CAPS: 15.0000 mg | ORAL_CAPSULE | ORAL | Status: DC

## 2011-11-12 NOTE — Progress Notes (Signed)
36 yo here for follow up.  1.  DM-  Lab Results  Component Value Date   HGBA1C 7.2* 04/02/2011    On Metformin 1000 mg bid.   Admits to not watching her diet or monitoring her blood sugars.    2. Obesity- weight continues to go up. Wt Readings from Last 3 Encounters:  11/12/11 385 lb (174.635 kg)  04/02/11 375 lb (170.099 kg)  10/27/10 387 lb 8 oz (175.769 kg)   Has tried weight watchers in past- worked well because she was "accountable."  Says she does not have the money to try this again. Terrified of bariatric surgery.  Has been to several seminars about it. Admits to not exercising.  Her back hurts constantly.  Patient Active Problem List  Diagnosis  . Type II or unspecified type diabetes mellitus without mention of complication, not stated as uncontrolled  . POLYCYSTIC OVARIES  . DEPRESSION, MAJOR, RECURRENT, MODERATE  . DEPRESSION  . FATIGUE  . Ganglion cyst  . Obesity, morbid, BMI 50 or higher   Past Medical History  Diagnosis Date  . Depression   . Diabetes mellitus   . PCOS (polycystic ovarian syndrome)   . Obesity    Past Surgical History  Procedure Date  . Cholecystectomy   . Breast surgery     breast reduction   History  Substance Use Topics  . Smoking status: Never Smoker   . Smokeless tobacco: Never Used  . Alcohol Use: Yes   Family History  Problem Relation Age of Onset  . Heart attack Father 75  . Cancer      adenoid cystic carcinoma   Allergies  Allergen Reactions  . Morphine     REACTION: vomiting   Current Outpatient Prescriptions on File Prior to Visit  Medication Sig Dispense Refill  . buPROPion (WELLBUTRIN SR) 150 MG 12 hr tablet 1 TAB BY MOUTH EVERY MORNING.  30 tablet  6  . buPROPion (WELLBUTRIN SR) 150 MG 12 hr tablet 1 TAB BY MOUTH EVERY MORNING.  30 tablet  1  . famotidine (PEPCID) 10 MG tablet Take 10 mg by mouth daily.        . ferrous sulfate 325 (65 FE) MG tablet Take 325 mg by mouth daily with breakfast.        . glucose  blood test strip 1 each by Other route as directed. Use as instructed       . Lancets (ONETOUCH ULTRASOFT) lancets 1 each by Other route as directed. Use as instructed       . metFORMIN (GLUCOPHAGE) 1000 MG tablet Take 1 tablet (1,000 mg total) by mouth 2 (two) times daily with a meal.  60 tablet  6  . tranexamic acid (LYSTEDA) 650 MG TABS Take by mouth. Take two tablets three times a day during menstrual cycle       . phentermine 15 MG capsule Take 1 capsule (15 mg total) by mouth every morning.  30 capsule  0  . [DISCONTINUED] metFORMIN (GLUCOPHAGE) 1000 MG tablet TAKE 1 TABLET (1,000 MG TOTAL) BY MOUTH 2 (TWO) TIMES DAILY WITH A MEAL.  60 tablet  5  . [DISCONTINUED] metFORMIN (GLUCOPHAGE) 500 MG tablet 1 TAB BY MOUTH TWO TIMES A DAY  60 tablet  6      The PMH, PSH, Social History, Family History, Medications, and allergies have been reviewed in [facility], and have been updated if relevant.   Review of Systems       See HPI No  episodes of hypoglycemia.  Physical Exam BP 128/82  Pulse 88  Temp 98.9 F (37.2 C) (Oral)  Wt 385 lb (174.635 kg)  LMP 10/12/2011  General:  morbidly obese, NAD Mouth:  good dentition.   Neck:  No deformities, masses, or tenderness noted. Lungs:  Normal respiratory effort, chest expands symmetrically. Lungs are clear to auscultation, no crackles or wheezes. Heart:  Normal rate and regular rhythm. S1 and S2 normal without gallop, murmur, click, rub or other extra sounds. Neurologic:  No cranial nerve deficits noted. Station and gait are normal. Plantar reflexes are down-going bilaterally. DTRs are symmetrical throughout. Sensory, motor and coordinative functions appear intact. Psych:  Cognition and judgment appear intact. Alert and cooperative with normal attention span and concentration. No apparent delusions, illusions, hallucinations  Assessment and Plan: 1. Type II or unspecified type diabetes mellitus without mention of complication, not stated as  uncontrolled   Deteriorated.  Discussed importance of restarting diet and exercise. The patient indicates understanding of these issues and agrees with the plan.  Orders Placed This Encounter  Procedures  . Flu vaccine greater than or equal to 3yo preservative free IM  . Hemoglobin A1c  . Comprehensive metabolic panel  . Lipid Panel      2. Obesity >25 min spent with face to face with patient, >50% counseling and/or coordinating care.  Discussed different types of bariatric surgery, she still is not interested.  Discussed phentermine- risks and benefits.  She is willing to try this.  Will start phentermine 15 mg daily.  Follow up in one month.

## 2011-11-12 NOTE — Patient Instructions (Addendum)
Good to see you, Kristy Conrad.  Please come see me in 1 month for follow up.  Phentermine tablets or capsules What is this medicine? PHENTERMINE (FEN ter meen) decreases your appetite. It is used with a reduced calorie diet and exercise to help you lose weight. This medicine may be used for other purposes; ask your health care provider or pharmacist if you have questions. What should I tell my health care provider before I take this medicine? They need to know if you have any of these conditions: -agitation -glaucoma -heart disease -high blood pressure -history of substance abuse -lung disease called Primary Pulmonary Hypertension (PPH) -taken an MAOI like Carbex, Eldepryl, Marplan, Nardil, or Parnate in last 14 days -thyroid disease -an unusual or allergic reaction to phentermine, other medicines, foods, dyes, or preservatives -pregnant or trying to get pregnant -breast-feeding How should I use this medicine? Take this medicine by mouth with a glass of water. Follow the directions on the prescription label. This medicine is usually taken 30 minutes before or 1 to 2 hours after breakfast. Avoid taking this medicine in the evening. It may interfere with sleep. Take your doses at regular intervals. Do not take your medicine more often than directed. Talk to your pediatrician regarding the use of this medicine in children. Special care may be needed. Overdosage: If you think you have taken too much of this medicine contact a poison control center or emergency room at once. NOTE: This medicine is only for you. Do not share this medicine with others. What if I miss a dose? If you miss a dose, take it as soon as you can. If it is almost time for your next dose, take only that dose. Do not take double or extra doses. What may interact with this medicine? Do not take this medicine with any of the following medications: -duloxetine -MAOIs like Carbex, Eldepryl, Marplan, Nardil, and Parnate -medicines  for colds or breathing difficulties like pseudoephedrine or phenylephrine -procarbazine -sibutramine -SSRIs like citalopram, escitalopram, fluoxetine, fluvoxamine, paroxetine, and sertraline -stimulants like dexmethylphenidate, methylphenidate or modafinil -venlafaxine This medicine may also interact with the following medications: -medicines for diabetes This list may not describe all possible interactions. Give your health care provider a list of all the medicines, herbs, non-prescription drugs, or dietary supplements you use. Also tell them if you smoke, drink alcohol, or use illegal drugs. Some items may interact with your medicine. What should I watch for while using this medicine? Notify your physician immediately if you become short of breath while doing your normal activities. Do not take this medicine within 6 hours of bedtime. It can keep you from getting to sleep. Avoid drinks that contain caffeine and try to stick to a regular bedtime every night. This medicine was intended to be used in addition to a healthy diet and exercise. The best results are achieved this way. This medicine is only indicated for short-term use. Eventually your weight loss may level out. At that point, the drug will only help you maintain your new weight. Do not increase or in any way change your dose without consulting your doctor. You may get drowsy or dizzy. Do not drive, use machinery, or do anything that needs mental alertness until you know how this medicine affects you. Do not stand or sit up quickly, especially if you are an older patient. This reduces the risk of dizzy or fainting spells. Alcohol may increase dizziness and drowsiness. Avoid alcoholic drinks. What side effects may I notice from receiving  this medicine? Side effects that you should report to your doctor or health care professional as soon as possible: -chest pain, palpitations -depression or severe changes in mood -increased blood  pressure -irritability -nervousness or restlessness -severe dizziness -shortness of breath -problems urinating -unusual swelling of the legs -vomiting Side effects that usually do not require medical attention (report to your doctor or health care professional if they continue or are bothersome): -blurred vision or other eye problems -changes in sexual ability or desire -constipation or diarrhea -difficulty sleeping -dry mouth or unpleasant taste -headache -nausea This list may not describe all possible side effects. Call your doctor for medical advice about side effects. You may report side effects to FDA at 1-800-FDA-1088. Where should I keep my medicine? Keep out of the reach of children. This medicine can be abused. Keep your medicine in a safe place to protect it from theft. Do not share this medicine with anyone. Selling or giving away this medicine is dangerous and against the law. Store at room temperature between 20 and 25 degrees C (68 and 77 degrees F). Keep container tightly closed. Throw away any unused medicine after the expiration date. NOTE: This sheet is a summary. It may not cover all possible information. If you have questions about this medicine, talk to your doctor, pharmacist, or health care provider.  2012, [employer]. (02/01/2010 11:02:44 AM)

## 2011-11-13 LAB — COMPREHENSIVE METABOLIC PANEL
AST: 37 U/L (ref 0–37)
BUN: 11 mg/dL (ref 6–23)
CO2: 28 mEq/L (ref 19–32)
Calcium: 9.5 mg/dL (ref 8.4–10.5)
Chloride: 100 mEq/L (ref 96–112)
Creatinine, Ser: 0.7 mg/dL (ref 0.4–1.2)
GFR: 98.08 mL/min (ref >60.00)
Total Bilirubin: 0.4 mg/dL (ref 0.3–1.2)

## 2011-11-13 LAB — LIPID PANEL
HDL: 31.3 mg/dL — ABNORMAL LOW (ref >39.00)
Triglycerides: 170 mg/dL — ABNORMAL HIGH (ref 0.0–149.0)
VLDL: 34 mg/dL (ref 0.0–40.0)

## 2011-11-16 ENCOUNTER — Telehealth: Payer: Self-pay

## 2011-11-16 NOTE — Telephone Encounter (Signed)
Pt left v/m had dropped off handicap placard form on 11/15/11 to be completed and pt will pick up on 11/19/11.

## 2011-11-16 NOTE — Telephone Encounter (Signed)
Form placed at front desk for pick up, left message advising patient.

## 2011-12-31 ENCOUNTER — Ambulatory Visit: Payer: BC Managed Care – PPO | Admitting: Family Medicine

## 2012-01-09 ENCOUNTER — Other Ambulatory Visit: Payer: Self-pay | Admitting: Family Medicine

## 2012-01-21 ENCOUNTER — Ambulatory Visit (INDEPENDENT_AMBULATORY_CARE_PROVIDER_SITE_OTHER): Payer: BC Managed Care – PPO | Admitting: Family Medicine

## 2012-01-21 ENCOUNTER — Encounter: Payer: Self-pay | Admitting: Family Medicine

## 2012-01-21 VITALS — BP 122/82 | HR 98 | Temp 98.2°F | Wt 387.0 lb

## 2012-01-21 DIAGNOSIS — E119 Type 2 diabetes mellitus without complications: Secondary | ICD-10-CM

## 2012-01-21 DIAGNOSIS — F329 Major depressive disorder, single episode, unspecified: Secondary | ICD-10-CM

## 2012-01-21 MED ORDER — PHENTERMINE HCL 30 MG PO CAPS: 30.0000 mg | ORAL_CAPSULE | ORAL | Status: DC

## 2012-01-21 NOTE — Progress Notes (Signed)
37 yo here for follow up.  1.  DM-  Lab Results  Component Value Date   HGBA1C 7.3* 11/12/2011    On Metformin 1000 mg bid.   Admits to not watching her diet or monitoring her blood sugars.    2. Obesity- weight continues to go up. Wt Readings from Last 3 Encounters:  01/21/12 387 lb (175.542 kg)  11/12/11 385 lb (174.635 kg)  04/02/11 375 lb (170.099 kg)   Has tried weight watchers in past- worked well because she was "accountable."  Says she does not have the money to try this again. Terrified of bariatric surgery.  Has been to several seminars about it. Admits to not exercising.  Her back hurts constantly.  Prescribed phentermine in 11/2011 but did not return for one month follow up.  She did feel that it helped with her appetite.  Ate smaller portions. No CP or SOB.  Able to sleep ok with it.  Patient Active Problem List  Diagnosis  . Type II or unspecified type diabetes mellitus without mention of complication, not stated as uncontrolled  . POLYCYSTIC OVARIES  . DEPRESSION, MAJOR, RECURRENT, MODERATE  . DEPRESSION  . FATIGUE  . Obesity, morbid, BMI 50 or higher   Past Medical History  Diagnosis Date  . Depression   . Diabetes mellitus   . PCOS (polycystic ovarian syndrome)   . Obesity    Past Surgical History  Procedure Date  . Cholecystectomy   . Breast surgery     breast reduction   History  Substance Use Topics  . Smoking status: Never Smoker   . Smokeless tobacco: Never Used  . Alcohol Use: Yes   Family History  Problem Relation Age of Onset  . Heart attack Father 34  . Cancer      adenoid cystic carcinoma   Allergies  Allergen Reactions  . Morphine     REACTION: vomiting   Current Outpatient Prescriptions on File Prior to Visit  Medication Sig Dispense Refill  . buPROPion (WELLBUTRIN SR) 150 MG 12 hr tablet 1 TAB BY MOUTH EVERY MORNING.  30 tablet  6  . buPROPion (WELLBUTRIN SR) 150 MG 12 hr tablet 1 TAB BY MOUTH EVERY MORNING.  30 tablet  1    . famotidine (PEPCID) 10 MG tablet Take 10 mg by mouth daily.        . ferrous sulfate 325 (65 FE) MG tablet Take 325 mg by mouth daily with breakfast.        . glucose blood test strip 1 each by Other route as directed. Use as instructed       . Lancets (ONETOUCH ULTRASOFT) lancets 1 each by Other route as directed. Use as instructed       . metFORMIN (GLUCOPHAGE) 1000 MG tablet Take 1 tablet (1,000 mg total) by mouth 2 (two) times daily with a meal.  60 tablet  6  . metFORMIN (GLUCOPHAGE) 1000 MG tablet TAKE 1 TABLET (1,000 MG TOTAL) BY MOUTH 2 (TWO) TIMES DAILY WITH A MEAL.  60 tablet  5  . phentermine 15 MG capsule Take 1 capsule (15 mg total) by mouth every morning.  30 capsule  0  . tranexamic acid (LYSTEDA) 650 MG TABS Take by mouth. Take two tablets three times a day during menstrual cycle           The PMH, PSH, Social History, Family History, Medications, and allergies have been reviewed in [facility], and have been updated if relevant.  Review of Systems       See HPI No episodes of hypoglycemia.  Physical Exam BP 122/82  Pulse 98  Temp 98.2 F (36.8 C)  Wt 387 lb (175.542 kg)  General:  morbidly obese, NAD Mouth:  good dentition.   Neck:  No deformities, masses, or tenderness noted. Lungs:  Normal respiratory effort, chest expands symmetrically. Lungs are clear to auscultation, no crackles or wheezes. Heart:  Normal rate and regular rhythm. S1 and S2 normal without gallop, murmur, click, rub or other extra sounds. Neurologic:  No cranial nerve deficits noted. Station and gait are normal. Plantar reflexes are down-going bilaterally. DTRs are symmetrical throughout. Sensory, motor and coordinative functions appear intact. Psych:  Cognition and judgment appear intact. Alert and cooperative with normal attention span and concentration. No apparent delusions, illusions, hallucinations  Assessment and Plan: 1. Type II or unspecified type diabetes mellitus without mention of  complication, not stated as uncontrolled    Discussed importance of restarting diet and exercise.  Will check a1c in 1 month. The patient indicates understanding of these issues and agrees with the plan.      2. Obesity Deteriorated.  Discussed different types of bariatric surgery, she still is not interested.  Will restart phentermine at higher dose- 30 mg daily.  Follow up in one month.

## 2012-01-21 NOTE — Patient Instructions (Addendum)
Good to see you. We are restarting phentermine 30 mg every morning.  Please come see me in 1 month.  We will repeat your a1c at that time.

## 2012-07-16 ENCOUNTER — Other Ambulatory Visit: Payer: Self-pay | Admitting: Family Medicine

## 2012-07-28 ENCOUNTER — Ambulatory Visit (INDEPENDENT_AMBULATORY_CARE_PROVIDER_SITE_OTHER): Payer: BC Managed Care – PPO | Admitting: Family Medicine

## 2012-07-28 ENCOUNTER — Encounter: Payer: Self-pay | Admitting: Family Medicine

## 2012-07-28 VITALS — BP 142/90 | HR 84 | Temp 98.2°F | Ht 64.5 in | Wt 396.0 lb

## 2012-07-28 DIAGNOSIS — E119 Type 2 diabetes mellitus without complications: Secondary | ICD-10-CM

## 2012-07-28 DIAGNOSIS — G4700 Insomnia, unspecified: Secondary | ICD-10-CM

## 2012-07-28 DIAGNOSIS — Z113 Encounter for screening for infections with a predominantly sexual mode of transmission: Secondary | ICD-10-CM

## 2012-07-28 DIAGNOSIS — F331 Major depressive disorder, recurrent, moderate: Secondary | ICD-10-CM

## 2012-07-28 LAB — COMPREHENSIVE METABOLIC PANEL
ALT: 56 U/L — ABNORMAL HIGH (ref 0–35)
Alkaline Phosphatase: 60 U/L (ref 39–117)
CO2: 25 mEq/L (ref 19–32)
Creatinine, Ser: 0.7 mg/dL (ref 0.4–1.2)
GFR: 94.63 mL/min (ref >60.00)
Total Bilirubin: 0.3 mg/dL (ref 0.3–1.2)

## 2012-07-28 LAB — HEMOGLOBIN A1C: Hgb A1c MFr Bld: 8.6 % — ABNORMAL HIGH (ref 4.6–6.5)

## 2012-07-28 MED ORDER — AZITHROMYCIN 250 MG PO TABS: ORAL_TABLET | ORAL | Status: DC

## 2012-07-28 MED ORDER — CEFTRIAXONE SODIUM 250 MG IJ SOLR
250.0000 mg | Freq: Once | INTRAMUSCULAR | Status: AC
  Administered 2012-07-28: 250 mg via INTRAMUSCULAR

## 2012-07-28 MED ORDER — PHENTERMINE HCL 30 MG PO CAPS: 30.0000 mg | ORAL_CAPSULE | ORAL | Status: DC

## 2012-07-28 NOTE — Patient Instructions (Addendum)
Good to see you. Please take Azithromycin as directed- 1 gram x 1.  Come in tomorrow to leave a urine sample before you have showered.  We will call you with your lab results.

## 2012-07-28 NOTE — Progress Notes (Signed)
37 yo here for follow up.  1.  DM-  Lab Results  Component Value Date   HGBA1C 7.3* 11/12/2011    On Metformin 1000 mg bid.   Admits to not watching her diet or monitoring her blood sugars.    2. Obesity- weight continues to go up.  Wt Readings from Last 3 Encounters:  07/28/12 396 lb (179.624 kg)  01/21/12 387 lb (175.542 kg)  11/12/11 385 lb (174.635 kg)    Has tried weight watchers in past- worked well because she was "accountable."  Says she does not have the money to try this again. Terrified of bariatric surgery.  Has been to several seminars about it. Admits to not exercising.  Her back hurts constantly.  Prescribed phentermine in 11/2011 but did not return for one month follow up.  She did feel that it helped with her appetite.  Ate smaller portions. No CP or SOB.  Able to sleep ok with it.  She wants to restart it.  Exposure to STD- new partner.  He was diagnosed with gonorrhea.  She denies dysuria or vaginal discharge but hard for her to tell since she is currently on her period.  Patient Active Problem List   Diagnosis Date Noted  . Obesity, morbid, BMI 50 or higher 11/12/2011  . FATIGUE 01/24/2010  . Type II or unspecified type diabetes mellitus without mention of complication, not stated as uncontrolled 07/08/2009  . POLYCYSTIC OVARIES 07/08/2009  . DEPRESSION, MAJOR, RECURRENT, MODERATE 07/08/2009  . DEPRESSION 07/08/2009   Past Medical History  Diagnosis Date  . Depression   . Diabetes mellitus   . PCOS (polycystic ovarian syndrome)   . Obesity    Past Surgical History  Procedure Laterality Date  . Cholecystectomy    . Breast surgery      breast reduction   History  Substance Use Topics  . Smoking status: Never Smoker   . Smokeless tobacco: Never Used  . Alcohol Use: Yes   Family History  Problem Relation Age of Onset  . Heart attack Father 34  . Cancer      adenoid cystic carcinoma   Allergies  Allergen Reactions  . Morphine     REACTION:  vomiting   Current Outpatient Prescriptions on File Prior to Visit  Medication Sig Dispense Refill  . famotidine (PEPCID) 10 MG tablet Take 10 mg by mouth daily.        . ferrous sulfate 325 (65 FE) MG tablet Take 325 mg by mouth daily with breakfast.        . glucose blood test strip 1 each by Other route as directed. Use as instructed       . Lancets (ONETOUCH ULTRASOFT) lancets 1 each by Other route as directed. Use as instructed       . metFORMIN (GLUCOPHAGE) 1000 MG tablet TAKE 1 TABLET (1,000 MG TOTAL) BY MOUTH 2 (TWO) TIMES DAILY WITH A MEAL.  60 tablet  5  . phentermine 30 MG capsule Take 1 capsule (30 mg total) by mouth every morning.  30 capsule  0  . tranexamic acid (LYSTEDA) 650 MG TABS Take by mouth. Take two tablets three times a day during menstrual cycle        No current facility-administered medications on file prior to visit.      The PMH, PSH, Social History, Family History, Medications, and allergies have been reviewed in [facility], and have been updated if relevant.   Review of Systems  See HPI No episodes of hypoglycemia.  Physical Exam BP 142/90  Pulse 84  Temp(Src) 98.2 F (36.8 C)  Wt 396 lb (179.624 kg)  BMI 66.95 kg/m2  General:  morbidly obese, NAD Mouth:  good dentition.   Neck:  No deformities, masses, or tenderness noted. Lungs:  Normal respiratory effort, chest expands symmetrically. Lungs are clear to auscultation, no crackles or wheezes. Heart:  Normal rate and regular rhythm. S1 and S2 normal without gallop, murmur, click, rub or other extra sounds. Neurologic:  No cranial nerve deficits noted. Station and gait are normal. Plantar reflexes are down-going bilaterally. DTRs are symmetrical throughout. Sensory, motor and coordinative functions appear intact. Psych:  Cognition and judgment appear intact. Alert and cooperative with normal attention span and concentration. No apparent delusions, illusions, hallucinations  Assessment and Plan:  1.  Type II or unspecified type diabetes mellitus without mention of complication, not stated as uncontrolled  Recheck a1c today.  Does not check CBG regularly. - Hemoglobin A1c - Comprehensive metabolic panel  2. DEPRESSION, MAJOR, RECURRENT, MODERATE Stable.    3. Screening for STD (sexually transmitted disease) With known exposure to gonorrhea.  She refuses genital swab today since she is on her period. Treat with IM CTX and oral Azithromycin. Check STD labs, return tomorrow for Urine GC/Chlamydia. Discussed safe sexual practices - HIV Antibody - RPR - HSV(herpes smplx)abs-1+2(IgG+IgM)-bld - GC probe amplification, urine; Future - cefTRIAXone (ROCEPHIN) injection 250 mg; Inject 250 mg into the muscle once.  4. Obesity, morbid, BMI 50 or higher Deteriorated. Restart Phentermine.  She is aware of risks. - Ambulatory referral to Pulmonology  5. Insomnia Likely due to body habitus. Will refer for sleep study to rule out OSA. The patient indicates understanding of these issues and agrees with the plan.  - Ambulatory referral to Pulmonology

## 2012-07-29 LAB — RPR

## 2012-07-29 LAB — HSV(HERPES SMPLX)ABS-I+II(IGG+IGM)-BLD
HSV 1 Glycoprotein G Ab, IgG: 8.14 IV — ABNORMAL HIGH
HSV 2 Glycoprotein G Ab, IgG: 7.64 IV — ABNORMAL HIGH
Herpes Simplex Vrs I&II-IgM Ab (EIA): 2.16 INDEX — ABNORMAL HIGH

## 2012-07-29 NOTE — Addendum Note (Signed)
Addended by: Alvina Chou on: 07/29/2012 11:42 AM   Modules accepted: Orders

## 2012-07-30 ENCOUNTER — Other Ambulatory Visit: Payer: Self-pay | Admitting: Family Medicine

## 2012-07-30 DIAGNOSIS — E119 Type 2 diabetes mellitus without complications: Secondary | ICD-10-CM

## 2012-07-30 MED ORDER — ACYCLOVIR 400 MG PO TABS: 400.0000 mg | ORAL_TABLET | Freq: Three times a day (TID) | ORAL | Status: AC

## 2012-08-01 ENCOUNTER — Telehealth: Payer: Self-pay | Admitting: *Deleted

## 2012-08-01 NOTE — Telephone Encounter (Signed)
No the oral medication should help and i would not advise anything topical.

## 2012-08-01 NOTE — Telephone Encounter (Signed)
Left message on voice mail, per patient's request, advising her.

## 2012-08-01 NOTE — Telephone Encounter (Signed)
No she does not need to be on a suppressive daily dose unless she has multiple outbreaks.  At this point, she may never have an outbreak.  If she has more questions, we can talk about it in an office visit.

## 2012-08-01 NOTE — Telephone Encounter (Signed)
Advised patient as instructed.  She would like to come in for office visit to discuss, but will have to call back for that.  She states she does have some tingling or itching in the labia and anal areas and is asking if there is something that she can put on that.  Please advise.

## 2012-08-01 NOTE — Telephone Encounter (Signed)
Pt is asking if she should be on a daily prophylactic for herpes, she asks if the 10 day course of zovirax can help prevent an outbreak, then taking something every day should help her to never have outbreaks.  Please advise.  Pt has a lot of questions regarding this diagnosis, and doesn't want to look it up online.

## 2012-09-02 ENCOUNTER — Ambulatory Visit: Payer: BC Managed Care – PPO | Admitting: Internal Medicine

## 2012-09-15 ENCOUNTER — Ambulatory Visit (INDEPENDENT_AMBULATORY_CARE_PROVIDER_SITE_OTHER): Payer: BC Managed Care – PPO | Admitting: Pulmonary Disease

## 2012-09-15 ENCOUNTER — Encounter: Payer: Self-pay | Admitting: Pulmonary Disease

## 2012-09-15 VITALS — BP 124/92 | HR 96 | Temp 99.2°F | Ht 65.0 in | Wt >= 6400 oz

## 2012-09-15 DIAGNOSIS — G4733 Obstructive sleep apnea (adult) (pediatric): Secondary | ICD-10-CM

## 2012-09-15 NOTE — Assessment & Plan Note (Signed)
The patient's history is very suggestive of clinically significant sleep apnea.  I've had a long discussion with her about sleep apnea, including its impact to her quality of life and cardiovascular health.  She will need a sleep study for diagnosis, and I have outlined both a formal sleep study at the center versus home sleep testing.  I suspect home sleep testing will be adequate, since more than likely she has severe disease.  The patient is concerned about an equivocal home sleep test, possibly because of frequent awakenings.  She would like to think about the 2 options, and will get back with me in the morning.

## 2012-09-15 NOTE — Patient Instructions (Addendum)
Decide about home sleep testing vs. In lab testing.  Call and let me know.  We can then arrange.

## 2012-09-15 NOTE — Progress Notes (Signed)
Subjective:    Patient ID: Kristy Conrad, female    DOB: [birth year], 37 y.o.   MRN: [id number]  HPI The patient is a 37 year old female who I've been asked to see for possible obstructive sleep apnea.  The patient states she has been noted to have loud snoring, as well as an abnormal breathing pattern during sleep.  She also describes choking arousals, and is unable to lie down flat to sleep.  She has frequent awakenings at night, and is unrested in the mornings with a 75% of the time.  She has definite sleep pressure during the day with inactivity, and has even fallen asleep at her desk.  She denies any issues in the evening watching television or movies, but does have some sleepiness with driving.  The patient states that her weight is up about 25 pounds over the last 2 years, and her Epworth score today is 12   Sleep Questionnaire What time do you typically go to bed?( Between what hours) 10p-12am 10p-12am at 1618 on 09/15/12 by Maisie Fus, CMA How long does it take you to fall asleep? 31min-1.5hrs 22min-1.5hrs at 1618 on 09/15/12 by Maisie Fus, CMA How many times during the night do you wake up? 5 5 at 1618 on 09/15/12 by Maisie Fus, CMA What time do you get out of bed to start your day? 0630 0630 at 1618 on 09/15/12 by Maisie Fus, CMA Do you drive or operate heavy machinery in your occupation? No No at 1618 on 09/15/12 by Maisie Fus, CMA How much has your weight changed (up or down) over the past two years? (In pounds) 25 lb (11.34 kg)25 lb (11.34 kg) increase at 1618 on 09/15/12 by Maisie Fus, CMA Have you ever had a sleep study before? No No at 1618 on 09/15/12 by Maisie Fus, CMA Do you currently use CPAP? No No at 1618 on 09/15/12 by Maisie Fus, CMA Do you wear oxygen at any time? No No at 1618 on 09/15/12 by Maisie Fus, CMA   Review of Systems  Constitutional: Positive for unexpected weight change. Negative for fever.  HENT:  Positive for congestion. Negative for ear pain, nosebleeds, sore throat, rhinorrhea, sneezing, trouble swallowing, dental problem, postnasal drip and sinus pressure.   Eyes: Negative for redness and itching.  Respiratory: Positive for shortness of breath. Negative for cough, chest tightness and wheezing.   Cardiovascular: Positive for palpitations ( irregular heartbeats). Negative for leg swelling.  Gastrointestinal: Negative for nausea and vomiting.  Genitourinary: Negative for dysuria.  Musculoskeletal: Positive for joint swelling and arthralgias.  Skin: Positive for rash ( itching).  Neurological: Positive for headaches.  Hematological: Does not bruise/bleed easily.  Psychiatric/Behavioral: Positive for dysphoric mood. The patient is nervous/anxious.        Objective:   Physical Exam Constitutional:  Morbidly obese female, no acute distress  HENT:  Nares patent without discharge  Oropharynx without exudate, palate and uvula are thick and elongated, +small posterior pharynx, mild tonsillar hypertrophy.  Eyes:  Perrla, eomi, no scleral icterus  Neck:  No JVD, no TMG  Cardiovascular:  Normal rate, regular rhythm, no rubs or gallops.  No murmurs        Intact distal pulses  Pulmonary :  Normal breath sounds, no stridor or respiratory distress   No rales, rhonchi, or wheezing  Abdominal:  Soft, nondistended, bowel sounds present.  No tenderness noted.   Musculoskeletal:  2+ lower extremity edema noted.  Lymph Nodes:  No cervical lymphadenopathy noted  Skin:  No cyanosis noted  Neurologic:  Alert, appropriate, moves all 4 extremities without obvious deficit.         Assessment & Plan:

## 2012-09-16 ENCOUNTER — Encounter: Payer: Self-pay | Admitting: Internal Medicine

## 2012-09-16 ENCOUNTER — Ambulatory Visit (INDEPENDENT_AMBULATORY_CARE_PROVIDER_SITE_OTHER): Payer: BC Managed Care – PPO | Admitting: Internal Medicine

## 2012-09-16 VITALS — BP 124/94 | HR 104 | Temp 97.4°F | Wt 390.8 lb

## 2012-09-16 DIAGNOSIS — R609 Edema, unspecified: Secondary | ICD-10-CM

## 2012-09-16 MED ORDER — HYDROCHLOROTHIAZIDE 12.5 MG PO CAPS: 12.5000 mg | ORAL_CAPSULE | Freq: Every day | ORAL | Status: DC

## 2012-09-16 NOTE — Progress Notes (Signed)
Subjective:    Patient ID: Kristy Conrad, female    DOB: 08-31-1975, 37 y.o.   MRN: [id number]  HPI  Pt presents to the clinic today with c/o bilateral lower leg swelling. She reports this started about a month ago but seems to be getting worse. The swelling is worse throughout the day. She is to on her feet a lot. She does try to sit with her feet elevated. She denies chest pain or shortness of breath.  Review of Systems      Past Medical History  Diagnosis Date  . Depression   . Diabetes mellitus   . PCOS (polycystic ovarian syndrome)   . Obesity     Current Outpatient Prescriptions  Medication Sig Dispense Refill  . CRANBERRY PO Take 1 tablet by mouth daily.      . Cyanocobalamin (B-12 PO) Take 1 tablet by mouth daily.      . famotidine (PEPCID) 10 MG tablet Take 10 mg by mouth daily.        . ferrous sulfate 325 (65 FE) MG tablet Take 325 mg by mouth daily with breakfast.        . GARLIC PO Take 1 tablet by mouth daily.      . metFORMIN (GLUCOPHAGE) 1000 MG tablet TAKE 1 TABLET (1,000 MG TOTAL) BY MOUTH 2 (TWO) TIMES DAILY WITH A MEAL.  60 tablet  5  . phentermine 30 MG capsule Take 1 capsule (30 mg total) by mouth every morning.  30 capsule  0  . tranexamic acid (LYSTEDA) 650 MG TABS Take by mouth. Take two tablets three times a day during menstrual cycle       . glucose blood test strip 1 each by Other route as directed. Use as instructed       . Lancets (ONETOUCH ULTRASOFT) lancets 1 each by Other route as directed. Use as instructed        No current facility-administered medications for this visit.    Allergies  Allergen Reactions  . Morphine     REACTION: vomiting    Family History  Problem Relation Age of Onset  . Heart attack Father 64  . Cancer Mother     adenoid cystic carcinoma  . Emphysema Paternal Grandfather   . Emphysema Maternal Grandfather   . Heart disease Maternal Grandmother   . Cancer Maternal Grandfather   . Cancer Paternal Grandfather      History   Social History  . Marital Status: Single    Spouse Name: N/A    Number of Children: N/A  . Years of Education: N/A   Occupational History  . Teacher    Social History Main Topics  . Smoking status: Never Smoker   . Smokeless tobacco: Never Used  . Alcohol Use: Yes     Comment: 4-5 drinks per week  . Drug Use: No  . Sexual Activity: Not on file   Other Topics Concern  . Not on file   Social History Narrative  . No narrative on file     Constitutional: Denies fever, malaise, fatigue, headache or abrupt weight changes.  Respiratory: Denies difficulty breathing, shortness of breath, cough or sputum production.   Cardiovascular: Pt reports bilateral leg swelling. Denies chest pain, chest tightness, palpitations or swelling in the hand. Musculoskeletal: Denies decrease in range of motion, difficulty with gait, muscle pain or joint pain and swelling.  Skin: Denies redness, rashes, lesions or ulcercations.    No other specific complaints in a complete  review of systems (except as listed in HPI above).  Objective:   Physical Exam   BP 124/94  Pulse 104  Temp(Src) 97.4 F (36.3 C) (Oral)  Wt 390 lb 12 oz (177.243 kg)  BMI 65.02 kg/m2  SpO2 96% Wt Readings from Last 3 Encounters:  09/16/12 390 lb 12 oz (177.243 kg)  09/15/12 402 lb 3.2 oz (182.437 kg)  07/28/12 396 lb (179.624 kg)    General: Appears her stated age, obese butwell developed, well nourished in NAD. Skin: Warm, dry and intact. No rashes, lesions or ulcerations noted. Cardiovascular: Normal rate and rhythm. S1,S2 noted.  No murmur, rubs or gallops noted. No JVD 1+ pitting edema BLE. No carotid bruits noted. Pulmonary/Chest: Normal effort and positive vesicular breath sounds. No respiratory distress. No wheezes, rales or ronchi noted.    BMET    Component Value Date/Time   NA 136 07/28/2012 0842   K 4.1 07/28/2012 0842   CL 102 07/28/2012 0842   CO2 25 07/28/2012 0842   GLUCOSE 172*  07/28/2012 0842   BUN 10 07/28/2012 0842   CREATININE 0.7 07/28/2012 0842   CALCIUM 9.2 07/28/2012 0842   GFRNONAA 104.44 07/08/2009 1151    Lipid Panel     Component Value Date/Time   CHOL 129 11/12/2011 1636   TRIG 170.0* 11/12/2011 1636   HDL 31.30* 11/12/2011 1636   CHOLHDL 4 11/12/2011 1636   VLDL 34.0 11/12/2011 1636   LDLCALC 64 11/12/2011 1636    CBC    Component Value Date/Time   WBC 7.6 01/24/2010 1003   RBC 4.23 01/24/2010 1003   HGB 9.6* 01/24/2010 1003   HCT 29.6* 01/24/2010 1003   PLT 334.0 01/24/2010 1003   MCV 70.0* 01/24/2010 1003   MCHC 32.4 01/24/2010 1003   RDW 19.4* 01/24/2010 1003   LYMPHSABS 1.9 01/24/2010 1003   MONOABS 0.6 01/24/2010 1003   EOSABS 0.2 01/24/2010 1003   BASOSABS 0.0 01/24/2010 1003    Hgb A1C Lab Results  Component Value Date   HGBA1C 8.6* 07/28/2012        Assessment & Plan:   Peripheral edema, bilateral, new onset:  Last potassium reviewed 07/28/2012 ERX for hctz 12.5- monitor BP closely RTC in 2 weeks to recheck potassium  RTC as needed or swelling gets worse

## 2012-09-16 NOTE — Patient Instructions (Signed)

## 2012-09-18 ENCOUNTER — Telehealth: Payer: Self-pay | Admitting: Pulmonary Disease

## 2012-09-18 NOTE — Telephone Encounter (Signed)
Per last OV note recs were as follows :Decide about home sleep testing vs. In lab testing. Call and let me know. We can then arrange.   ATC work number, NA, no Engineer, technical sales. [email or web address] on home and cell number. Need to advise the pt that there is a wait list for home sleep machine so may not be able to do Oct 3rd. Carron Curie, CMA

## 2012-09-19 NOTE — Telephone Encounter (Signed)
ATC patient no answer [email or web address] 

## 2012-09-22 NOTE — Telephone Encounter (Signed)
I ATC pt at work # provided -  Received VM but did not leave msg as it was not a named VM and no dpr signed to permit this. I have lmomtcb on pt's home and cell #s

## 2012-09-23 NOTE — Telephone Encounter (Signed)
LMOM TCB x2 on home number

## 2012-09-24 ENCOUNTER — Telehealth: Payer: Self-pay | Admitting: Internal Medicine

## 2012-09-24 NOTE — Telephone Encounter (Signed)
LMTCB

## 2012-09-24 NOTE — Telephone Encounter (Signed)
I spoke with pt. She stated she would like to do the home sleep study. She does have [employer] as insurance and her deductible $700. She was told the home sleep study costs about $500-700. So she will basically being pay for the study herself. She wants to make sure if she does have the home sleep study and positive for sleep apnea that [employer] will pay for this since her deductible would be paid. Please advise KC regarding study thanks

## 2012-09-24 NOTE — Telephone Encounter (Signed)
Error.  Wrong patient.  Holly D Pryor ° °

## 2012-09-24 NOTE — Telephone Encounter (Signed)
We have to make sure that [employer] approves the home sleep study.  We have had cases where pts have paid for sleep study, had sleep apnea, but then insurance would not pay for cpap because sleep study never was approved.  This has to be verified.

## 2012-09-25 NOTE — Telephone Encounter (Signed)
LMOMTCB

## 2012-09-25 NOTE — Telephone Encounter (Signed)
Filled out forms to send to [employer] and see if pt needs precert for home sleep test Kristy Conrad

## 2012-09-30 ENCOUNTER — Other Ambulatory Visit: Payer: BC Managed Care – PPO

## 2012-10-02 ENCOUNTER — Other Ambulatory Visit (INDEPENDENT_AMBULATORY_CARE_PROVIDER_SITE_OTHER): Payer: BC Managed Care – PPO

## 2012-10-02 DIAGNOSIS — R609 Edema, unspecified: Secondary | ICD-10-CM

## 2012-10-02 LAB — POTASSIUM: Potassium: 4 mEq/L (ref 3.5–5.1)

## 2012-10-07 ENCOUNTER — Other Ambulatory Visit: Payer: Self-pay | Admitting: Pulmonary Disease

## 2012-10-07 DIAGNOSIS — G4733 Obstructive sleep apnea (adult) (pediatric): Secondary | ICD-10-CM

## 2012-10-08 ENCOUNTER — Telehealth: Payer: Self-pay

## 2012-10-08 NOTE — Telephone Encounter (Signed)
Pt left v/m requesting results from 10/02/12 labs. Left v/m for pt to cb.

## 2012-10-09 NOTE — Telephone Encounter (Signed)
Patient advised.   Patient says she has not officially tried the compression hose but would like to avoid them because she wears sandals most of the time.  She said that [facility] told her that we could increase her HCTZ slowly and monitor her K+ and that is what she was in hopes of doing.  Please advise.

## 2012-10-09 NOTE — Telephone Encounter (Signed)
Pt called for 10/02/12 lab results; notified as instructed from result notes. Pt said both feet and ankles are still swollen and hurting;pt request increase in dosage of  HCTZ 12.5 mg taking one daily. Pt wants to know if can increase the 12.5 mg so she will not waste that med. Pt seen on 09/16/12 and swelling is slightly better but still notable.[facility] [address]. Pt request cb.

## 2012-10-09 NOTE — Telephone Encounter (Signed)
I would not increase dose of HCTZ.  I would advise keeping feet elevated when seated.  Has she tried compression hose?

## 2012-10-09 NOTE — Telephone Encounter (Signed)
I would not want her to take 25 mg of HCTZ daily as it could lower her blood pressure and potassium too much.  It would be ok on occasion (i.e a couple of times a week) to take an extra HCTZ 12.5 mg as needed for swelling as long as we were monitoring her potassium.

## 2012-10-09 NOTE — Telephone Encounter (Signed)
Left detailed message on voicemail.  

## 2012-10-10 ENCOUNTER — Other Ambulatory Visit: Payer: Self-pay | Admitting: Internal Medicine

## 2012-10-22 ENCOUNTER — Encounter: Payer: Self-pay | Admitting: Internal Medicine

## 2012-10-22 ENCOUNTER — Ambulatory Visit (INDEPENDENT_AMBULATORY_CARE_PROVIDER_SITE_OTHER): Payer: BC Managed Care – PPO | Admitting: Internal Medicine

## 2012-10-22 ENCOUNTER — Ambulatory Visit: Payer: BC Managed Care – PPO | Admitting: Internal Medicine

## 2012-10-22 ENCOUNTER — Ambulatory Visit: Payer: BC Managed Care – PPO | Admitting: Family Medicine

## 2012-10-22 VITALS — BP 118/70 | HR 96 | Temp 98.1°F | Resp 12 | Ht 65.0 in | Wt 391.1 lb

## 2012-10-22 DIAGNOSIS — E119 Type 2 diabetes mellitus without complications: Secondary | ICD-10-CM

## 2012-10-22 DIAGNOSIS — Z23 Encounter for immunization: Secondary | ICD-10-CM

## 2012-10-22 NOTE — Progress Notes (Signed)
Patient ID: Kristy Conrad, female   DOB: Nov 22, 1975, 37 y.o.   MRN: [id number]  HPI: Kristy Conrad is a 37 y.o.-year-old female, referred by her PCP, Dr.Aron, for management of DM2, non-insulin-dependent, uncontrolled, without complications.  Patient has been diagnosed with diabetes in 2010; she has not been on insulin before. Last hemoglobin A1c was: Lab Results  Component Value Date   HGBA1C 8.6* 07/28/2012   HGBA1C 7.3* 11/12/2011   HGBA1C 7.2* 04/02/2011  She believes her HbA1C has been high at last check  As she and her new b'friend started to eat out.   Pt is on a regimen of: - Metformin 1000 mg po bid  Pt does not check her sugars. She tells me she can feel if it is low or high.   No lows; she has hypoglycemia awareness at ~80s.   Pt's meals are: - Breakfast: varies: sometimes skips, sometimes bananas, oatmeal, buisquit or cereals  - Lunch: varies: sometimes skips, sometimes soup, noodles, or leftovers from the night before - Dinner: pasta/rice and baked or grilled chicken - Snacks: popcorn, bananas, nutrigrain bars - 0-2 a day She is a couponer. She tried Weight Watchers several years ago >> lost 50 lbs then. Would like to start this again.  - no CKD, last BUN/creatinine:  Lab Results  Component Value Date   BUN 10 07/28/2012   CREATININE 0.7 07/28/2012   - last set of lipids: Lab Results  Component Value Date   CHOL 129 11/12/2011   HDL 31.30* 11/12/2011   LDLCALC 64 11/12/2011   TRIG 170.0* 11/12/2011   CHOLHDL 4 11/12/2011   - last eye exam was ~ 4 years ago. No DR.  - no numbness and tingling in her feet. Has swollen feet >> feels she has restlegs  I reviewed her chart and she also has a history of possible OSA >> will have a sleep study.  No has FH of DM.  ROS: Constitutional: + weight gain, + fatigue, + subjective hyperthermia,+ poor sleep  Eyes: no blurry vision, no xerophthalmia ENT: no sore throat, no nodules palpated in throat, no  dysphagia/odynophagia, no hoarseness Cardiovascular: no CP/+ SOB/+palpitations/+leg swelling Respiratory: no cough/+SOB Gastrointestinal: no N/V/D/C Musculoskeletal: + all: muscle/joint aches Skin: + rash, itching, excessive hair growth Neurological: no tremors/numbness/tingling/dizziness, + HA Psychiatric: no depression/anxiety Irreg. menstrual cycles  Past Medical History  Diagnosis Date  . Depression   . Diabetes mellitus   . PCOS (polycystic ovarian syndrome)   . Obesity   Anemia   Past Surgical History  Procedure Laterality Date  . Cholecystectomy    . Breast surgery      breast reduction   History   Social History  . Marital Status: Single    Spouse Name: N/A    Number of Children: 0   Occupational History  . 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 Smoking status: Never Smoker   . Smokeless tobacco: Never Used  . Alcohol Use: Yes     Comment: 4-5 drinks per week  . Drug Use: No  . Sexual Activity: Yes    Partners: Male   Social History Narrative   Regular exercise: no   Caffeine use: daily, tea   Current Outpatient Prescriptions on File Prior to Visit  Medication Sig Dispense Refill  . Cyanocobalamin (B-12 PO) Take 1 tablet by mouth daily.      . famotidine (PEPCID) 10 MG tablet Take 10 mg by mouth daily.        Marland Kitchen  glucose blood test strip 1 each by Other route as directed. Use as instructed       . hydrochlorothiazide (MICROZIDE) 12.5 MG capsule TAKE ONE CAPSULE BY MOUTH EVERY DAY  30 capsule  4  . Lancets (ONETOUCH ULTRASOFT) lancets 1 each by Other route as directed. Use as instructed       . metFORMIN (GLUCOPHAGE) 1000 MG tablet TAKE 1 TABLET (1,000 MG TOTAL) BY MOUTH 2 (TWO) TIMES DAILY WITH A MEAL.  60 tablet  5  . phentermine 30 MG capsule Take 1 capsule (30 mg total) by mouth every morning.  30 capsule  0  . tranexamic acid (LYSTEDA) 650 MG TABS Take by mouth. Take two tablets three times a day during menstrual cycle        . CRANBERRY PO Take 1 tablet by mouth daily.      Marland Kitchen GARLIC PO Take 1 tablet by mouth daily.       No current facility-administered medications on file prior to visit.   Allergies  Allergen Reactions  . Morphine     REACTION: vomiting   Family History  Problem Relation Age of Onset  . Heart attack Father 42  . Cancer Mother     adenoid cystic carcinoma  . Emphysema Paternal Grandfather   . Emphysema Maternal Grandfather   . Heart disease Maternal Grandmother   . Cancer Maternal Grandfather   . Cancer Paternal Grandfather    PE: BP 118/70  Pulse 96  Temp(Src) 98.1 F (36.7 C) (Oral)  Resp 12  Ht 5\' 5"  (1.651 m)  Wt 391 lb 1.9 oz (177.411 kg)  BMI 65.09 kg/m2  SpO2 96% Wt Readings from Last 3 Encounters:  10/22/12 391 lb 1.9 oz (177.411 kg)  09/16/12 390 lb 12 oz (177.243 kg)  09/15/12 402 lb 3.2 oz (182.437 kg)   Constitutional: obese, in NAD Eyes: PERRLA, EOMI, no exophthalmos ENT: moist mucous membranes, no thyromegaly, no cervical lymphadenopathy Cardiovascular: RRR, No MRG, + bilateral pitting edema  Respiratory: CTA B Gastrointestinal: abdomen soft, NT, ND, BS+ Musculoskeletal: no deformities, strength intact in all 4 Skin: moist, warm, no rashes Neurological: no tremor with outstretched hands, DTR normal in all 4  ASSESSMENT: 1. DM2, non-insulin-dependent, uncontrolled, without complications  PLAN:  1. Patient with 4 yr h/oDM2, recently more uncontrolled, on oral antidiabetic regimen, which became insufficient. She pinpointed the cause of her recently increased HbA1c is her increased eating out habit. Approx 1 mo ago, she and her b'friend started to eat at home to save money. She is working on improving her diet.  - We discussed about options for treatment, and I suggested to:  Please continue to improve your diet as you already are. Continue Metformin 1000 mg 2x a day. Start checking sugars daily and write them down - rotate check times. Please return in  2 months with your sugar log. Please let me know if you need me to send strips/lancets/meter prescriptions to your pharmacy. - given sugar log and advised how to fill it and to bring it at next appt  - given foot care handout and explained the principles  - given instructions for hypoglycemia management "15-15 rule"  - advised for yearly eye exams >> need to schedule new one - will order Hba1C for 10 days from now (not 3 mo yet) - given flu vaccine today  - Return to clinic in 2 mo with sugar log (prefers this to 1 mo, as she will be in vacation then)

## 2012-10-22 NOTE — Patient Instructions (Signed)
Please continue to improve your diet as you already are. Continue Metformin 1000 mg 2x a day. Start checking sugars daily and write them down - rotate check times. Please return in 2 months with your sugar log. Please let me know if you need me to send strips/lancets/meter prescriptions to your pharmacy.  PATIENT INSTRUCTIONS FOR TYPE 2 DIABETES:  DIET AND EXERCISE Diet and exercise is an important part of diabetic treatment.  We recommended aerobic exercise in the form of brisk walking (working between 40-60% of maximal aerobic capacity, similar to brisk walking) for 150 minutes per week (such as 30 minutes five days per week) along with 3 times per week performing 'resistance' training (using various gauge rubber tubes with handles) 5-10 exercises involving the major muscle groups (upper body, lower body and core) performing 10-15 repetitions (or near fatigue) each exercise. Start at half the above goal but build slowly to reach the above goals. If limited by weight, joint pain, or disability, we recommend daily walking in a swimming pool with water up to waist to reduce pressure from joints while allow for adequate exercise.    BLOOD GLUCOSES Monitoring your blood glucoses is important for continued management of your diabetes. Please check your blood glucoses 2-4 times a day: fasting, before meals and at bedtime (you can rotate these measurements - e.g. one day check before the 3 meals, the next day check before 2 of the meals and before bedtime, etc.   HYPOGLYCEMIA (low blood sugar) Hypoglycemia is usually a reaction to not eating, exercising, or taking too much insulin/ other diabetes drugs.  Symptoms include tremors, sweating, hunger, confusion, headache, etc. Treat IMMEDIATELY with 15 grams of Carbs:   4 glucose tablets    cup regular juice/soda   2 tablespoons raisins   4 teaspoons sugar   1 tablespoon honey Recheck blood glucose in 15 mins and repeat above if still  symptomatic/blood glucose <100. Please contact our office at [phone] if you have questions about how to next handle your insulin.  RECOMMENDATIONS TO REDUCE YOUR RISK OF DIABETIC COMPLICATIONS: * Take your prescribed MEDICATION(S). * Follow a DIABETIC diet: Complex carbs, fiber rich foods, heart healthy fish twice weekly, (monounsaturated and polyunsaturated) fats * AVOID saturated/trans fats, high fat foods, >2,300 mg salt per day. * EXERCISE at least 5 times a week for 30 minutes or preferably daily.  * DO NOT SMOKE OR DRINK more than 1 drink a day. * Check your FEET every day. Do not wear tightfitting shoes. Contact us if you develop an ulcer * See your EYE doctor once a year or more if needed * Get a FLU shot once a year * Get a PNEUMONIA vaccine once before and once after age 1 years  GOALS:  * Your Hemoglobin A1c of <7%  * fasting sugars need to be <130 * after meals sugars need to be <180 (2h after you start eating) * Your Systolic BP should be 140 or lower  * Your Diastolic BP should be 80 or lower  * Your HDL (Good Cholesterol) should be 40 or higher  * Your LDL (Bad Cholesterol) should be 100 or lower  * Your Triglycerides should be 150 or lower  * Your Urine microalbumin (kidney function) should be <30 * Your Body Mass Index should be 25 or lower   We will be glad to help you achieve these goals. Our telephone number is: [phone].

## 2012-10-31 DIAGNOSIS — R0609 Other forms of dyspnea: Secondary | ICD-10-CM

## 2012-10-31 DIAGNOSIS — R0989 Other specified symptoms and signs involving the circulatory and respiratory systems: Secondary | ICD-10-CM

## 2012-11-06 ENCOUNTER — Other Ambulatory Visit (INDEPENDENT_AMBULATORY_CARE_PROVIDER_SITE_OTHER): Payer: BC Managed Care – PPO

## 2012-11-06 DIAGNOSIS — E119 Type 2 diabetes mellitus without complications: Secondary | ICD-10-CM

## 2012-11-07 ENCOUNTER — Encounter: Payer: Self-pay | Admitting: Pulmonary Disease

## 2012-11-07 ENCOUNTER — Telehealth: Payer: Self-pay | Admitting: Pulmonary Disease

## 2012-11-07 DIAGNOSIS — G4733 Obstructive sleep apnea (adult) (pediatric): Secondary | ICD-10-CM

## 2012-11-07 NOTE — Telephone Encounter (Signed)
Per KC- okay to use a 4:45 or 5 pm slot, just NOT on Wednesday please LMTCB for the pt

## 2012-11-07 NOTE — Telephone Encounter (Signed)
Please let pt know that she does have severe sleep apnea, and need to see her next week to discuss treatment options.

## 2012-11-10 ENCOUNTER — Other Ambulatory Visit: Payer: Self-pay | Admitting: Internal Medicine

## 2012-11-10 ENCOUNTER — Telehealth: Payer: Self-pay | Admitting: Internal Medicine

## 2012-11-10 NOTE — Telephone Encounter (Signed)
Patient had her potassium checked on 10/02/12.  Does she need to have lab work done?  Patient said you were going to check her potassium again one month after her last lab since she's taking a water pill.  Please advise.

## 2012-11-10 NOTE — Telephone Encounter (Signed)
Yes she needs a potassium level done

## 2012-11-10 NOTE — Telephone Encounter (Signed)
Pt scheduled for 11/10/12 at 1045 with [facility]

## 2012-11-11 ENCOUNTER — Other Ambulatory Visit (INDEPENDENT_AMBULATORY_CARE_PROVIDER_SITE_OTHER): Payer: BC Managed Care – PPO

## 2012-11-11 ENCOUNTER — Encounter: Payer: Self-pay | Admitting: Pulmonary Disease

## 2012-11-11 ENCOUNTER — Ambulatory Visit (INDEPENDENT_AMBULATORY_CARE_PROVIDER_SITE_OTHER): Payer: BC Managed Care – PPO | Admitting: Pulmonary Disease

## 2012-11-11 VITALS — BP 128/78 | HR 89 | Temp 98.2°F | Ht 65.0 in | Wt 393.0 lb

## 2012-11-11 DIAGNOSIS — G4733 Obstructive sleep apnea (adult) (pediatric): Secondary | ICD-10-CM

## 2012-11-11 DIAGNOSIS — T503X1A Poisoning by electrolytic, caloric and water-balance agents, accidental (unintentional), initial encounter: Secondary | ICD-10-CM

## 2012-11-11 DIAGNOSIS — T502X1A Poisoning by carbonic-anhydrase inhibitors, benzothiadiazides and other diuretics, accidental (unintentional), initial encounter: Secondary | ICD-10-CM

## 2012-11-11 NOTE — Assessment & Plan Note (Signed)
The patient has severe OSA by her recent sleep study, and will need to be treated with CPAP.  She is agreeable to this approach. I will set the patient up on cpap at a moderate pressure level to allow for desensitization, and will troubleshoot the device over the next 4-6weeks if needed.  The pt is to call me if having issues with tolerance.  Will then optimize the pressure once patient is able to wear cpap on a consistent basis.

## 2012-11-11 NOTE — Progress Notes (Signed)
  Subjective:    Patient ID: Kristy Conrad, female    DOB: 12-14-1975, 37 y.o.   MRN: [id number]  HPI The patient comes in today for followup of her recent sleep study.  She was found to have severe obstructive sleep apnea with an AHI of 61 events per hour and oxygen desaturation of 57%.  I have reviewed the study with her in detail, and answered all of her questions.   Review of Systems  Constitutional: Negative for fever and unexpected weight change.  HENT: Positive for ear pain. Negative for congestion, dental problem, nosebleeds, postnasal drip, rhinorrhea, sinus pressure, sneezing, sore throat and trouble swallowing.   Eyes: Positive for itching. Negative for redness.  Respiratory: Positive for shortness of breath. Negative for cough, chest tightness and wheezing.   Cardiovascular: Positive for palpitations and leg swelling.  Gastrointestinal: Negative for nausea and vomiting.  Genitourinary: Negative for dysuria.  Musculoskeletal: Negative for joint swelling.  Skin: Negative for rash.  Neurological: Negative for headaches.  Hematological: Does not bruise/bleed easily.  Psychiatric/Behavioral: Negative for dysphoric mood. The patient is not nervous/anxious.        Objective:   Physical Exam Obese female in no acute distress Nose without purulence or discharge noted Neck without lymphadenopathy or thyromegaly Lower extremities with edema noted, no cyanosis Alert, does not appear to be sleepy, moves all 4 extremities.       Assessment & Plan:

## 2012-11-11 NOTE — Patient Instructions (Signed)
Will start on cpap at a moderate pressure level.  Please call if any tolerance issues.  Work on weight loss followup with me in 6 weeks.

## 2012-11-18 ENCOUNTER — Telehealth: Payer: Self-pay | Admitting: Pulmonary Disease

## 2012-11-18 NOTE — Telephone Encounter (Signed)
Message copied by Barbaraann Share on Tue Nov 18, 2012  9:01 AM ------      Message from: Modesto Charon      Created: Mon Nov 17, 2012  4:42 PM      Regarding: RE  Cpap setup       FYI: per [facility], pt says she  wanted to wait until around Jan 28, 2013, to set up CPAP due to insurance issues and deductibles. Pt was scheduled for 6 week followup from 11/11/12 ov sched 12/23/12. Since pt has not been set up due to her choice to wait, ov will be cancelled, until she has cpap setup. Ok with you ------

## 2012-11-18 NOTE — Telephone Encounter (Signed)
Noted  

## 2012-11-18 NOTE — Telephone Encounter (Signed)
Spoke with pt in ref to this appt, pt says she has not been in contact with [facility], has missed calls from them, she says she will contact them, she wants to have appt before Jan 2015, Spoke with Angelica Chessman and Marchelle Folks cust serv rep says she talked to pt on 11/12/12 to set up and she wanted to waited, due to having money still due on her deductible to be met. [facility] will call patient back to clarify, she has kept her appt for 12/23/12 for now for  her followup appt on cpap with Dr Shelle Iron.

## 2012-12-05 ENCOUNTER — Telehealth: Payer: Self-pay | Admitting: Pulmonary Disease

## 2012-12-05 NOTE — Telephone Encounter (Signed)
Pt returned call.  Holly D Pryor ° °

## 2012-12-05 NOTE — Telephone Encounter (Signed)
lmomtcb x1 

## 2012-12-05 NOTE — Telephone Encounter (Signed)
I spoke with the pt and she has questions about the billing for the sleep study. I advised I will send this message to Johny Drilling and have her call the pt. I have made chan aware that I have sent this message to her. Carron Curie, CMA

## 2012-12-10 NOTE — Telephone Encounter (Signed)
Called pt back lmam when I orignally to this call I explained to the patient that her sleep study had been billed that we are waiting on her ins to pay but she wants to go ahead and get her cpap set up if she is to have one before Jan 2015 since she has already met her deductible this year.  Please call patient back at her home number or mobile she is out of work until [employer] account [employer]

## 2012-12-10 NOTE — Telephone Encounter (Signed)
lmomtcb for pt on home and cell #s 

## 2012-12-11 NOTE — Telephone Encounter (Signed)
Spoke with pt.  Reports she has already spoken with [facility] who advised they ordered her cpap.  Pt states nothing further is needed from our office.  She will call back once she gets cpap set up to schedule a 6 wk follow up with KC.

## 2012-12-17 ENCOUNTER — Ambulatory Visit: Payer: BC Managed Care – PPO | Admitting: Internal Medicine

## 2012-12-23 ENCOUNTER — Ambulatory Visit: Payer: BC Managed Care – PPO | Admitting: Pulmonary Disease

## 2012-12-24 ENCOUNTER — Ambulatory Visit: Payer: BC Managed Care – PPO | Admitting: Internal Medicine

## 2013-01-19 ENCOUNTER — Encounter: Payer: Self-pay | Admitting: Family Medicine

## 2013-01-19 ENCOUNTER — Ambulatory Visit (INDEPENDENT_AMBULATORY_CARE_PROVIDER_SITE_OTHER): Payer: BC Managed Care – PPO | Admitting: Family Medicine

## 2013-01-19 VITALS — BP 128/68 | HR 89 | Temp 98.5°F | Ht 64.0 in | Wt 383.0 lb

## 2013-01-19 DIAGNOSIS — R609 Edema, unspecified: Secondary | ICD-10-CM

## 2013-01-19 DIAGNOSIS — F3289 Other specified depressive episodes: Secondary | ICD-10-CM

## 2013-01-19 DIAGNOSIS — F329 Major depressive disorder, single episode, unspecified: Secondary | ICD-10-CM

## 2013-01-19 DIAGNOSIS — G4733 Obstructive sleep apnea (adult) (pediatric): Secondary | ICD-10-CM

## 2013-01-19 DIAGNOSIS — E119 Type 2 diabetes mellitus without complications: Secondary | ICD-10-CM

## 2013-01-19 LAB — COMPREHENSIVE METABOLIC PANEL
ALBUMIN: 3.3 g/dL — AB (ref 3.5–5.2)
ALK PHOS: 61 U/L (ref 39–117)
ALT: 53 U/L — AB (ref 0–35)
AST: 62 U/L — AB (ref 0–37)
BILIRUBIN TOTAL: 0.4 mg/dL (ref 0.3–1.2)
BUN: 6 mg/dL (ref 6–23)
CO2: 30 mEq/L (ref 19–32)
Calcium: 9 mg/dL (ref 8.4–10.5)
Chloride: 99 mEq/L (ref 96–112)
Creatinine, Ser: 0.7 mg/dL (ref 0.4–1.2)
GFR: 104.21 mL/min (ref >60.00)
Glucose, Bld: 129 mg/dL — ABNORMAL HIGH (ref 70–99)
POTASSIUM: 4.2 meq/L (ref 3.5–5.1)
SODIUM: 136 meq/L (ref 135–145)
Total Protein: 8.2 g/dL (ref 6.0–8.3)

## 2013-01-19 LAB — MICROALBUMIN / CREATININE URINE RATIO
Creatinine,U: 167.3 mg/dL
MICROALB UR: 2.4 mg/dL — AB (ref 0.0–1.9)
Microalb Creat Ratio: 1.4 mg/g (ref 0.0–30.0)

## 2013-01-19 LAB — HEMOGLOBIN A1C: Hgb A1c MFr Bld: 7.5 % — ABNORMAL HIGH (ref 4.6–6.5)

## 2013-01-19 MED ORDER — METFORMIN HCL 1000 MG PO TABS: ORAL_TABLET | ORAL | Status: DC

## 2013-01-19 MED ORDER — HYDROCHLOROTHIAZIDE 12.5 MG PO CAPS: ORAL_CAPSULE | ORAL | Status: DC

## 2013-01-19 MED ORDER — HYDROCHLOROTHIAZIDE 12.5 MG PO CAPS
ORAL_CAPSULE | ORAL | Status: DC
Start: 2013-01-19 — End: 2013-03-30

## 2013-01-19 NOTE — Assessment & Plan Note (Signed)
Improving.

## 2013-01-19 NOTE — Assessment & Plan Note (Addendum)
Improving.  Advised to check sugars regularly and to make f/u with Dr. Elvera LennoxGherghe. The patient indicates understanding of these issues and agrees with the plan.  Check a1c today.  Orders Placed This Encounter  Procedures  . Hemoglobin A1c  . Comprehensive metabolic panel  . Microalbumin/Creatinine Ratio, Urine  . HM Diabetes Foot Exam

## 2013-01-19 NOTE — Assessment & Plan Note (Signed)
Improved with low dose hctz. eRx sent.

## 2013-01-19 NOTE — Progress Notes (Signed)
38 yo here for follow up.  1.  DM- now seeing Dr. Elvera Lennox but only saw her once, in 10/2012.  a1c improving.   Lab Results  Component Value Date   HGBA1C 7.8* 11/06/2012    On Metformin 1000 mg bid.   Admits to not watching her diet or monitoring her blood sugars which is why she didn't follow up with Dr. Elvera Lennox.    2. Obesity- improved!  Has lost 10 pounds!  Has a new boyfriend who recently moved in with her.  Cooking at home more.  Wt Readings from Last 3 Encounters:  01/19/13 383 lb (173.728 kg)  11/11/12 393 lb (178.264 kg)  10/22/12 391 lb 1.9 oz (177.411 kg)    Has tried weight watchers in past- worked well because she was "accountable."  Says she does not have the money to try this again. Terrified of bariatric surgery.  Has been to several seminars about it. Admits to not exercising.  Her back hurts constantly.  OSA- recently diagnosed by Dr. Shelle Iron.  Awaiting insurance approval for CPAP.  Given HCTZ by Nicki Reaper, NP, for peripheral edema.  Ran out of it but felt it did help.  Patient Active Problem List   Diagnosis Date Noted  . OSA (obstructive sleep apnea) 09/15/2012  . Screening for STD (sexually transmitted disease) 07/28/2012  . Obesity, morbid, BMI 50 or higher 11/12/2011  . FATIGUE 01/24/2010  . Type II or unspecified type diabetes mellitus without mention of complication, not stated as uncontrolled 07/08/2009  . POLYCYSTIC OVARIES 07/08/2009  . DEPRESSION, MAJOR, RECURRENT, MODERATE 07/08/2009  . DEPRESSION 07/08/2009   Past Medical History  Diagnosis Date  . Depression   . Diabetes mellitus   . PCOS (polycystic ovarian syndrome)   . Obesity    Past Surgical History  Procedure Laterality Date  . Cholecystectomy    . Breast surgery      breast reduction   History  Substance Use Topics  . Smoking status: Never Smoker   . Smokeless tobacco: Never Used  . Alcohol Use: Yes     Comment: 4-5 drinks per week   Family History  Problem Relation Age  of Onset  . Heart attack Father 55  . Cancer Mother     adenoid cystic carcinoma  . Emphysema Paternal Grandfather   . Emphysema Maternal Grandfather   . Heart disease Maternal Grandmother   . Cancer Maternal Grandfather   . Cancer Paternal Grandfather    Allergies  Allergen Reactions  . Morphine     REACTION: vomiting   Current Outpatient Prescriptions on File Prior to Visit  Medication Sig Dispense Refill  . CRANBERRY PO Take 1 tablet by mouth daily.      . Cyanocobalamin (B-12 PO) Take 1 tablet by mouth daily.      . famotidine (PEPCID) 10 MG tablet Take 10 mg by mouth daily.        . Ferrous Sulfate (IRON) 28 MG TABS Take 1 tablet by mouth daily.      Marland Kitchen GARLIC PO Take 1 tablet by mouth daily.      Marland Kitchen glucose blood test strip 1 each by Other route as directed. Use as instructed       . Lancets (ONETOUCH ULTRASOFT) lancets 1 each by Other route as directed. Use as instructed       . phentermine 30 MG capsule Take 1 capsule (30 mg total) by mouth every morning.  30 capsule  0  . tranexamic acid (LYSTEDA)  650 MG TABS Take by mouth. Take two tablets three times a day during menstrual cycle        No current facility-administered medications on file prior to visit.      The PMH, PSH, Social History, Family History, Medications, and allergies have been reviewed in [facility], and have been updated if relevant.   Review of Systems       See HPI No episodes of hypoglycemia.  Physical Exam BP 128/68  Pulse 89  Temp(Src) 98.5 F (36.9 C) (Oral)  Ht 5\' 4"  (1.626 m)  Wt 383 lb (173.728 kg)  BMI 65.71 kg/m2  SpO2 96%  LMP 12/24/2012  General:  morbidly obese, NAD Mouth:  good dentition.   Neck:  No deformities, masses, or tenderness noted. Lungs:  Normal respiratory effort, chest expands symmetrically. Lungs are clear to auscultation, no crackles or wheezes. Heart:  Normal rate and regular rhythm. S1 and S2 normal without gallop, murmur, click, rub or other extra  sounds. Neurologic:  No cranial nerve deficits noted. Station and gait are normal. Plantar reflexes are down-going bilaterally. DTRs are symmetrical throughout. Sensory, motor and coordinative functions appear intact. Psych:  Cognition and judgment appear intact. Alert and cooperative with normal attention span and concentration. No apparent delusions, illusions, hallucinations Ext:  Thick ankles Diabetic foot exam: Normal inspection No skin breakdown No calluses  Normal DP pulses Normal sensation to light touch and monofilament Nails normal  Assessment and Plan:

## 2013-01-19 NOTE — Patient Instructions (Signed)
We will call you with your lab results. Make a follow up with Dr. Elvera LennoxGherghe.

## 2013-01-19 NOTE — Assessment & Plan Note (Signed)
Awaiting CPAP 

## 2013-01-19 NOTE — Progress Notes (Signed)
Pre-visit discussion using our clinic review tool. No additional management support is needed unless otherwise documented below in the visit note.  

## 2013-01-20 ENCOUNTER — Telehealth: Payer: Self-pay

## 2013-01-20 ENCOUNTER — Encounter: Payer: Self-pay | Admitting: Family Medicine

## 2013-01-20 NOTE — Telephone Encounter (Signed)
Relevant patient education assigned to patient using Emmi. ° °

## 2013-02-07 ENCOUNTER — Other Ambulatory Visit: Payer: Self-pay | Admitting: Family Medicine

## 2013-02-09 ENCOUNTER — Other Ambulatory Visit: Payer: Self-pay | Admitting: Family Medicine

## 2013-02-10 ENCOUNTER — Telehealth: Payer: Self-pay

## 2013-02-10 DIAGNOSIS — N912 Amenorrhea, unspecified: Secondary | ICD-10-CM

## 2013-02-10 NOTE — Telephone Encounter (Signed)
Lm on pts vm informing her orders entered

## 2013-02-10 NOTE — Telephone Encounter (Signed)
Pt called and has been out of metformin 3 days; pt in meetings today and ask that I call [facility] [place] to have med transferred from [facility] [address]. Spoke with Tamika at [facility] and she will get med transferred. Pt notified done.  

## 2013-02-10 NOTE — Telephone Encounter (Signed)
Pt is not usually late for menstrual period and LMP was 12/24/12. Pt did home pregnancy test which was negative. Pt wants to come for blood pregnancy test and whether positive or negative will schedule appt with GYN. Pt would prefer having lab test rather than scheduling appt with Dr Dayton MartesAron; No PMS or abd pain.Please advise.Pt request cb.

## 2013-02-10 NOTE — Telephone Encounter (Signed)
Order entered for hcg quant.

## 2013-02-10 NOTE — Telephone Encounter (Signed)
Pt called and has been out of metformin 3 days; pt in meetings today and ask that I call [facility] [place] to [facility]. Spoke with Tamika at [facility] and she will get med transferred. Pt notified done.

## 2013-02-12 ENCOUNTER — Other Ambulatory Visit: Payer: Self-pay | Admitting: *Deleted

## 2013-02-12 MED ORDER — METFORMIN HCL 1000 MG PO TABS: ORAL_TABLET | ORAL | Status: DC

## 2013-02-13 ENCOUNTER — Other Ambulatory Visit (INDEPENDENT_AMBULATORY_CARE_PROVIDER_SITE_OTHER): Payer: BC Managed Care – PPO

## 2013-02-13 DIAGNOSIS — N912 Amenorrhea, unspecified: Secondary | ICD-10-CM

## 2013-02-13 LAB — HCG, QUANTITATIVE, PREGNANCY

## 2013-03-30 ENCOUNTER — Other Ambulatory Visit: Payer: Self-pay | Admitting: Internal Medicine

## 2013-03-30 NOTE — Telephone Encounter (Signed)
Pt was las seen 01/15/13 and was also given Rx with 2 refills--due to note on Rx for verification asking if okay to fill--Potassium was normal so wanted to verify--please advise

## 2013-05-21 ENCOUNTER — Ambulatory Visit: Payer: BC Managed Care – PPO | Admitting: Family Medicine

## 2013-05-21 ENCOUNTER — Encounter: Payer: Self-pay | Admitting: Family Medicine

## 2013-05-21 ENCOUNTER — Ambulatory Visit (INDEPENDENT_AMBULATORY_CARE_PROVIDER_SITE_OTHER): Payer: BC Managed Care – PPO | Admitting: Family Medicine

## 2013-05-21 VITALS — BP 126/88 | HR 102 | Temp 98.3°F | Wt 380.8 lb

## 2013-05-21 DIAGNOSIS — J029 Acute pharyngitis, unspecified: Secondary | ICD-10-CM

## 2013-05-21 LAB — POCT RAPID STREP A (OFFICE): Rapid Strep A Screen: NEGATIVE

## 2013-05-21 MED ORDER — AMOXICILLIN 875 MG PO TABS: 875.0000 mg | ORAL_TABLET | Freq: Two times a day (BID) | ORAL | Status: AC
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
## 2013-05-21 NOTE — Assessment & Plan Note (Signed)
ST, LA, likely fever, no cough with exposure.  Possible false neg RST.  Treat.  D/w pt.  See instructions.

## 2013-05-21 NOTE — Patient Instructions (Signed)
Start the amoxil today and try to get some rest.  Drink plenty of fluids, take tylenol as needed, and gargle with warm salt water for your throat.  This should gradually improve.  Take care.  Let us know if you have other concerns.

## 2013-07-08 LAB — HM PAP SMEAR

## 2013-07-13 ENCOUNTER — Telehealth: Payer: Self-pay | Admitting: *Deleted

## 2013-07-13 NOTE — Telephone Encounter (Signed)
Lm on pts vm requesting a call back to schedule DIABETIC BUNDLE LAB-needs LDL 

## 2013-07-14 NOTE — Telephone Encounter (Signed)
Pt left v/m requesting cb; left v /m for pt to cb.

## 2013-07-14 NOTE — Telephone Encounter (Signed)
Pt left v/m requesting cb; left v/m for pt to cb to schedule lab appt.

## 2013-07-17 ENCOUNTER — Other Ambulatory Visit: Payer: Self-pay | Admitting: Family Medicine

## 2013-07-20 NOTE — Telephone Encounter (Signed)
Pt called to scheduled LDL; pt said she would like f/u appt with Dr Dayton MartesAron as well as doing fasting labs. Pt scheduled appt on 08/07/13 at 8:15.

## 2013-08-07 ENCOUNTER — Ambulatory Visit (INDEPENDENT_AMBULATORY_CARE_PROVIDER_SITE_OTHER): Payer: BC Managed Care – PPO | Admitting: Family Medicine

## 2013-08-07 ENCOUNTER — Encounter: Payer: Self-pay | Admitting: Family Medicine

## 2013-08-07 VITALS — BP 128/74 | HR 87 | Temp 98.3°F | Wt 376.8 lb

## 2013-08-07 DIAGNOSIS — R945 Abnormal results of liver function studies: Secondary | ICD-10-CM

## 2013-08-07 DIAGNOSIS — E119 Type 2 diabetes mellitus without complications: Secondary | ICD-10-CM

## 2013-08-07 DIAGNOSIS — R609 Edema, unspecified: Secondary | ICD-10-CM

## 2013-08-07 DIAGNOSIS — F331 Major depressive disorder, recurrent, moderate: Secondary | ICD-10-CM

## 2013-08-07 DIAGNOSIS — F3289 Other specified depressive episodes: Secondary | ICD-10-CM

## 2013-08-07 DIAGNOSIS — F329 Major depressive disorder, single episode, unspecified: Secondary | ICD-10-CM

## 2013-08-07 DIAGNOSIS — R7989 Other specified abnormal findings of blood chemistry: Secondary | ICD-10-CM

## 2013-08-07 LAB — COMPREHENSIVE METABOLIC PANEL
ALBUMIN: 3.2 g/dL — AB (ref 3.5–5.2)
ALT: 64 U/L — ABNORMAL HIGH (ref 0–35)
AST: 79 U/L — ABNORMAL HIGH (ref 0–37)
Alkaline Phosphatase: 65 U/L (ref 39–117)
BUN: 8 mg/dL (ref 6–23)
CALCIUM: 8.7 mg/dL (ref 8.4–10.5)
CO2: 27 mEq/L (ref 19–32)
CREATININE: 0.6 mg/dL (ref 0.4–1.2)
Chloride: 98 mEq/L (ref 96–112)
GFR: 118.03 mL/min (ref >60.00)
Glucose, Bld: 196 mg/dL — ABNORMAL HIGH (ref 70–99)
Potassium: 4 mEq/L (ref 3.5–5.1)
Sodium: 131 mEq/L — ABNORMAL LOW (ref 135–145)
Total Bilirubin: 0.5 mg/dL (ref 0.2–1.2)
Total Protein: 7.7 g/dL (ref 6.0–8.3)

## 2013-08-07 LAB — LIPID PANEL
Cholesterol: 102 mg/dL (ref 0–200)
HDL: 27.1 mg/dL — ABNORMAL LOW (ref >39.00)
LDL CALC: 54 mg/dL (ref 0–99)
NonHDL: 74.9
Total CHOL/HDL Ratio: 4
Triglycerides: 107 mg/dL (ref 0.0–149.0)
VLDL: 21.4 mg/dL (ref 0.0–40.0)

## 2013-08-07 LAB — MICROALBUMIN / CREATININE URINE RATIO
CREATININE, U: 147 mg/dL
Microalb Creat Ratio: 1.4 mg/g (ref 0.0–30.0)
Microalb, Ur: 2.1 mg/dL — ABNORMAL HIGH (ref 0.0–1.9)

## 2013-08-07 LAB — HEMOGLOBIN A1C: Hgb A1c MFr Bld: 8.6 % — ABNORMAL HIGH (ref 4.6–6.5)

## 2013-08-07 MED ORDER — BUPROPION HCL ER (SR) 150 MG PO TB12: ORAL_TABLET | ORAL | Status: DC

## 2013-08-07 MED ORDER — PHENTERMINE HCL 30 MG PO CAPS: 30.0000 mg | ORAL_CAPSULE | ORAL | Status: DC

## 2013-08-07 MED ORDER — GLIPIZIDE ER 2.5 MG PO TB24: 2.5000 mg | ORAL_TABLET | Freq: Every day | ORAL | Status: DC

## 2013-08-07 NOTE — Addendum Note (Signed)
Addended by: Dianne DunARON, TALIA M on: 08/07/2013 01:06 PM   Modules accepted: Orders

## 2013-08-07 NOTE — Assessment & Plan Note (Signed)
Deteriorated. Discussed tx options. She will be attending psychotherapy with her boyfriend. Was on wellbutrin in past- she would like to restart this which is appropriate. eRx sent for wellbutrin 150 mg twice daily. Follow up in 1 month. The patient indicates understanding of these issues and agrees with the plan.

## 2013-08-07 NOTE — Assessment & Plan Note (Signed)
Improved but not at goal. Discussed weight loss plan again today.  She would like to restart phentermine.  She is aware of risk benefits, side effects including HTN, pulmonary HTN, stroke.     Rx given to pt for phentermine 30 mg daily.  Follow up in 1 month.  If BMI < 27 will decrease to half dose x 1 month then stop

## 2013-08-07 NOTE — Patient Instructions (Signed)
Good to see you. We are restarting your Wellbutrin and Phentermine.  Please come see me in one month.

## 2013-08-07 NOTE — Assessment & Plan Note (Signed)
Improved with low dose HCTZ. No changes.
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
## 2013-08-07 NOTE — Addendum Note (Signed)
Addended by: Alvina ChouWALSH, TERRI J on: 08/07/2013 03:42 PM   Modules accepted: Orders

## 2013-08-07 NOTE — Assessment & Plan Note (Signed)
Per pt, likely deteriorated. Check a1c, urine micro, lipid panel today.

## 2013-08-07 NOTE — Progress Notes (Signed)
Pre visit review using our clinic review tool, if applicable. No additional management support is needed unless otherwise documented below in the visit note. 

## 2013-08-26 ENCOUNTER — Telehealth: Payer: Self-pay | Admitting: Family Medicine

## 2013-08-26 NOTE — Telephone Encounter (Signed)
Does she mean the wellbutrin as well?  If so, let's wait one month from starting it.

## 2013-08-26 NOTE — Telephone Encounter (Signed)
Pt is calling b/c she was supposed to come back to you for a 1 month follow up around 09/08/2013, but she wants to let you know she didn't get the new b/p meds until this past Monday, 08/24/2013.  She wants to know if you still want her to make an appmt around 09/08/2013 or if you want her to wait a full month after beginning the med? Please call to advise. Thank you.

## 2013-08-26 NOTE — Telephone Encounter (Signed)
Lm on pts vm requesting a call back 

## 2013-08-27 ENCOUNTER — Ambulatory Visit: Payer: Self-pay | Admitting: Family Medicine

## 2013-08-28 ENCOUNTER — Encounter: Payer: Self-pay | Admitting: Family Medicine

## 2013-08-28 NOTE — Telephone Encounter (Signed)
Spoke to pt who was inquiring f/u for phentermine. Pts f/u appt sched for 09/25/13;78mo from start of med

## 2013-08-28 NOTE — Telephone Encounter (Signed)
Lm on pts vm requesting a call back 

## 2013-09-08 ENCOUNTER — Other Ambulatory Visit: Payer: Self-pay | Admitting: Family Medicine

## 2013-09-15 ENCOUNTER — Other Ambulatory Visit: Payer: Self-pay | Admitting: Family Medicine

## 2013-09-25 ENCOUNTER — Encounter: Payer: Self-pay | Admitting: Family Medicine

## 2013-09-25 ENCOUNTER — Ambulatory Visit (INDEPENDENT_AMBULATORY_CARE_PROVIDER_SITE_OTHER): Payer: BC Managed Care – PPO | Admitting: Family Medicine

## 2013-09-25 DIAGNOSIS — Z23 Encounter for immunization: Secondary | ICD-10-CM

## 2013-09-25 DIAGNOSIS — F331 Major depressive disorder, recurrent, moderate: Secondary | ICD-10-CM

## 2013-09-25 DIAGNOSIS — E119 Type 2 diabetes mellitus without complications: Secondary | ICD-10-CM

## 2013-09-25 MED ORDER — PHENTERMINE HCL 30 MG PO CAPS: 30.0000 mg | ORAL_CAPSULE | ORAL | Status: DC

## 2013-09-25 NOTE — Progress Notes (Signed)
38 yo female with h/43o morbid obesity, DM, and depression, here for follow up.  1.  DM- now seeing Dr. Elvera Lennox but only saw her once, in 10/2012.  Lab Results  Component Value Date   HGBA1C 8.6* 08/07/2013    On Metformin 1000 mg bid.   Urine micro positive in 01/2013. Has been working on cutting back on portions.  Lab Results  Component Value Date   CHOL 102 08/07/2013   HDL 27.10* 08/07/2013   LDLCALC 54 08/07/2013   TRIG 107.0 08/07/2013   CHOLHDL 4 08/07/2013    2. Obesity- restarted phentermine 30 mg last month. She has noticed an improvement- cut back on amount she is eating. Wt Readings from Last 3 Encounters:  09/25/13 368 lb 8 oz (167.151 kg)  08/07/13 376 lb 12 oz (170.893 kg)  05/21/13 380 lb 12 oz (172.707 kg)    3.  Depression- deteriorated when I saw her last month.  Going through more stressors at work.  Living with her boyfriend but they recently broke up.  Restarted Wellbutrin 150 mg twice daily last month.  She feels it is already working because she feels less tearful everyday about the break up.    Patient Active Problem List   Diagnosis Date Noted  . Edema 01/19/2013  . OSA (obstructive sleep apnea) 09/15/2012  . Screening for STD (sexually transmitted disease) 07/28/2012  . Obesity, morbid, BMI 50 or higher 11/12/2011  . FATIGUE 01/24/2010  . Type II or unspecified type diabetes mellitus without mention of complication, not stated as uncontrolled 07/08/2009  . POLYCYSTIC OVARIES 07/08/2009  . DEPRESSION, MAJOR, RECURRENT, MODERATE 07/08/2009  . DEPRESSION 07/08/2009   Past Medical History  Diagnosis Date  . Depression   . Diabetes mellitus   . PCOS (polycystic ovarian syndrome)   . Obesity    Past Surgical History  Procedure Laterality Date  . Cholecystectomy    . Breast surgery      breast reduction   History  Substance Use Topics  . Smoking status: Never Smoker   . Smokeless tobacco: Never Used  . Alcohol Use: Yes     Comment: 4-5 drinks per week    Family History  Problem Relation Age of Onset  . Heart attack Father 31  . Cancer Mother     adenoid cystic carcinoma  . Emphysema Paternal Grandfather   . Emphysema Maternal Grandfather   . Heart disease Maternal Grandmother   . Cancer Maternal Grandfather   . Cancer Paternal Grandfather    Allergies  Allergen Reactions  . Morphine     REACTION: vomiting   Current Outpatient Prescriptions on File Prior to Visit  Medication Sig Dispense Refill  . buPROPion (WELLBUTRIN SR) 150 MG 12 hr tablet 1 TAB BY MOUTH EVERY MORNING.  30 tablet  6  . Ferrous Sulfate (IRON) 28 MG TABS Take 1 tablet by mouth daily.      . folic acid (FOLVITE) 1 MG tablet Take 1 mg by mouth daily.      Marland Kitchen GLIPIZIDE XL 2.5 MG 24 hr tablet TAKE 1 TABLET (2.5 MG TOTAL) BY MOUTH DAILY WITH BREAKFAST.  30 tablet  0  . glucose blood test strip 1 each by Other route as directed. Use as instructed       . hydrochlorothiazide (HYDRODIURIL) 12.5 MG tablet TAKE 1 CAPSULE (12.5 MG TOTAL) BY MOUTH DAILY.  30 tablet  0  . Lancets (ONETOUCH ULTRASOFT) lancets 1 each by Other route as directed. Use as instructed       .  metFORMIN (GLUCOPHAGE) 1000 MG tablet TAKE 1 TABLET (1,000 MG TOTAL) BY MOUTH 2 (TWO) TIMES DAILY WITH A MEAL.  60 tablet  4  . tranexamic acid (LYSTEDA) 650 MG TABS Take by mouth. Take two tablets three times a day during menstrual cycle        No current facility-administered medications on file prior to visit.      The PMH, PSH, Social History, Family History, Medications, and allergies have been reviewed in [facility], and have been updated if relevant.   Review of Systems       See HPI No episodes of hypoglycemia. No SI or HI No CP or SOB No palpitations No n/v/d  Physical Exam BP 128/66  Pulse 83  Temp(Src) 98.3 F (36.8 C) (Oral)  Wt 368 lb 8 oz (167.151 kg)  SpO2 95%  LMP 09/18/2013  General:  morbidly obese, NAD Mouth:  good dentition.   Neck:  No deformities, masses, or tenderness  noted. Lungs:  Normal respiratory effort, chest expands symmetrically. Lungs are clear to auscultation, no crackles or wheezes. Heart:  Normal rate and regular rhythm. S1 and S2 normal without gallop, murmur, click, rub or other extra sounds. Neurologic:  No cranial nerve deficits noted. Station and gait are normal. Plantar reflexes are down-going bilaterally. DTRs are symmetrical throughout. Sensory, motor and coordinative functions appear intact. Psych:   Good eye contact, not tearful today. Does appear anxious Cognition and judgment appear intact. Alert and cooperative with normal attention span and concentration. No apparent delusions, illusions, hallucinations Ext:  Thick ankles, edema improved Diabetic foot exam: Normal inspection No skin breakdown No calluses  Normal DP pulses Normal sensation to light touch and monofilament Nails normal

## 2013-09-25 NOTE — Assessment & Plan Note (Signed)
Improving. Has good support system. Continue current dose of wellbutrin.

## 2013-09-25 NOTE — Assessment & Plan Note (Signed)
Improving without side effects from phentermine. Phentermine refilled today. Follow up in 1 month.

## 2013-09-25 NOTE — Patient Instructions (Signed)
Great to see you. Hang in there.  Keep me updated. 

## 2013-09-25 NOTE — Progress Notes (Signed)
Pre visit review using our clinic review tool, if applicable. No additional management support is needed unless otherwise documented below in the visit note. 

## 2013-09-25 NOTE — Addendum Note (Signed)
Addended by: Desmond Dike on: 09/25/2013 08:30 AM   Modules accepted: Orders

## 2013-10-07 ENCOUNTER — Other Ambulatory Visit: Payer: Self-pay | Admitting: Family Medicine

## 2013-10-11 ENCOUNTER — Other Ambulatory Visit: Payer: Self-pay | Admitting: Family Medicine

## 2013-10-12 ENCOUNTER — Telehealth: Payer: Self-pay | Admitting: Pulmonary Disease

## 2013-10-12 NOTE — Telephone Encounter (Signed)
FYI noted; Also, I called [facility] to find out why the patient is just now being set up on a CPAP almost a year later from the order that was sent. Marchelle [facility]manda at [facility] states that the patient was having a hard time (financially) and wanted to wait; also [facility] states they attempted to call patient multiple times in the past year with no success. Pt then called them recently stating she was ready to set up her CPAP machine and a SM[facility]. They nee this asap so patient can start CPAP and then follow up 6 weeks after with [facility].

## 2013-10-21 NOTE — Telephone Encounter (Signed)
Yes I do have this cmn it is Dr Shelle Ironlance to sign folder, I will give to him today to see if he will be willing to sign before his next signing in November. Kandice Hams.Ieshia Hatcher

## 2013-10-21 NOTE — Telephone Encounter (Signed)
Alida, have you received a signed CMN on this pt?

## 2013-10-27 ENCOUNTER — Encounter: Payer: Self-pay | Admitting: Family Medicine

## 2013-10-27 ENCOUNTER — Ambulatory Visit (INDEPENDENT_AMBULATORY_CARE_PROVIDER_SITE_OTHER): Payer: BC Managed Care – PPO | Admitting: Family Medicine

## 2013-10-27 DIAGNOSIS — F331 Major depressive disorder, recurrent, moderate: Secondary | ICD-10-CM

## 2013-10-27 DIAGNOSIS — E1165 Type 2 diabetes mellitus with hyperglycemia: Secondary | ICD-10-CM

## 2013-10-27 MED ORDER — PHENTERMINE HCL 30 MG PO CAPS: 30.0000 mg | ORAL_CAPSULE | ORAL | Status: DC

## 2013-10-27 NOTE — Progress Notes (Signed)
38 yo female with h/o morbid obesity, DM, and depression, here for follow up.  1.  DM- now seeing Dr. Elvera LennoxGherghe. Still has not made follow up appointment.  Lab Results  Component Value Date   HGBA1C 8.6* 08/07/2013    On Metformin 1000 mg bid.   Urine micro positive in 01/2013. Has been working on cutting back on portions but admits to often "not trying hard."  Lab Results  Component Value Date   CHOL 102 08/07/2013   HDL 27.10* 08/07/2013   LDLCALC 54 08/07/2013   TRIG 107.0 08/07/2013   CHOLHDL 4 08/07/2013    2. Obesity- restarted phentermine 30 mg daily. . She has noticed an improvement- cut back on amount she is eating but feels she could lose more if she "really tried." Wt Readings from Last 3 Encounters:  10/27/13 363 lb (164.656 kg)  09/25/13 368 lb 8 oz (167.151 kg)  08/07/13 376 lb 12 oz (170.893 kg)    3.  Depression- improved with Wellbutrin.  [place]- excited about [place].   Patient Active Problem List   Diagnosis Date Noted  . Edema 01/19/2013  . OSA (obstructive sleep apnea) 09/15/2012  . Screening for STD (sexually transmitted disease) 07/28/2012  . Obesity, morbid, BMI 50 or higher 11/12/2011  . FATIGUE 01/24/2010  . Poorly controlled diabetes mellitus 07/08/2009  . POLYCYSTIC OVARIES 07/08/2009  . DEPRESSION, MAJOR, RECURRENT, MODERATE 07/08/2009  . DEPRESSION 07/08/2009   Past Medical History  Diagnosis Date  . Depression   . Diabetes mellitus   . PCOS (polycystic ovarian syndrome)   . Obesity    Past Surgical History  Procedure Laterality Date  . Cholecystectomy    . Breast surgery      breast reduction   History  Substance Use Topics  . Smoking status: Never Smoker   . Smokeless tobacco: Never Used  . Alcohol Use: Yes     Comment: 4-5 drinks per week   Family History  Problem Relation Age of Onset  . Heart attack Father [id number]  . Cancer Mother     adenoid cystic carcinoma  . Emphysema Paternal Grandfather   . Emphysema  Maternal Grandfather   . Heart disease Maternal Grandmother   . Cancer Maternal Grandfather   . Cancer Paternal Grandfather    Allergies  Allergen Reactions  . Morphine     REACTION: vomiting   Current Outpatient Prescriptions on File Prior to Visit  Medication Sig Dispense Refill  . buPROPion (WELLBUTRIN SR) 150 MG 12 hr tablet 1 TAB BY MOUTH EVERY MORNING.  30 tablet  6  . Ferrous Sulfate (IRON) 28 MG TABS Take 1 tablet by mouth daily.      . folic acid (FOLVITE) 1 MG tablet Take 1 mg by mouth daily.      Marland Kitchen. GLIPIZIDE XL 2.5 MG 24 hr tablet TAKE 1 TABLET (2.5 MG TOTAL) BY MOUTH DAILY WITH BREAKFAST.  30 tablet  2  . glucose blood test strip 1 each by Other route as directed. Use as instructed       . hydrochlorothiazide (HYDRODIURIL) 12.5 MG tablet TAKE 1 CAPSULE (12.5 MG TOTAL) BY MOUTH DAILY.  30 tablet  0  . Lancets (ONETOUCH ULTRASOFT) lancets 1 each by Other route as directed. Use as instructed       . metFORMIN (GLUCOPHAGE) 1000 MG tablet TAKE 1 TABLET  BY MOUTH 2 (TWO) TIMES DAILY WITH A MEAL.  60 tablet  1  . tranexamic acid (  LYSTEDA) 650 MG TABS Take by mouth. Take two tablets three times a day during menstrual cycle        No current facility-administered medications on file prior to visit.      The PMH, PSH, Social History, Family History, Medications, and allergies have been [facility], and have been updated if relevant.   Review of Systems       See HPI No episodes of hypoglycemia. No SI or HI No CP or SOB No palpitations No n/v/d  Physical Exam BP 132/88  Pulse 70  Temp(Src) 98 F (36.7 C) (Oral)  Wt 363 lb (164.656 kg)  SpO2 96%  LMP 09/18/2013  General:  morbidly obese, NAD Mouth:  good dentition.   Neck:  No deformities, masses, or tenderness noted. Lungs:  Normal respiratory effort, chest expands symmetrically. Lungs are clear to auscultation, no crackles or wheezes. Heart:  Normal rate and regular rhythm. S1 and S2 normal without gallop,  murmur, click, rub or other extra sounds. Neurologic:  No cranial nerve deficits noted. Station and gait are normal. Plantar reflexes are down-going bilaterally. DTRs are symmetrical throughout. Sensory, motor and coordinative functions appear intact. Psych:   Good eye contact, not tearful today. Does appear anxious Cognition and judgment appear intact. Alert and cooperative with normal attention span and concentration. No apparent delusions, illusions, hallucinations

## 2013-10-27 NOTE — Patient Instructions (Signed)
Good to see you. Please restart checking your blood sugars.  Come see me in 2 months.

## 2013-10-27 NOTE — Assessment & Plan Note (Signed)
Improved on current dose of Wellbutrin. No changes made today. The patient indicates understanding of these issues and agrees with the plan.

## 2013-10-27 NOTE — Assessment & Plan Note (Addendum)
Not checking FSBS regularly and has not been keeping appointments with endocrinology. I had a long talk with Kristy Conrad about this today.  Important to get her diabetes under control- already affecting her kidneys (pos urine micro). She plans to take it more seriously and schedule follow up with Dr. Elvera LennoxGherghe.  Not yet due for a1c.

## 2013-10-27 NOTE — Assessment & Plan Note (Signed)
Improving but admits to not working as hard as she could.  She plans to stick to a diet.  Does feel decreased appetite with phentermine. Rx refilled today.

## 2013-11-09 ENCOUNTER — Other Ambulatory Visit: Payer: Self-pay | Admitting: Family Medicine

## 2013-12-21 ENCOUNTER — Encounter: Payer: Self-pay | Admitting: Family Medicine

## 2013-12-21 ENCOUNTER — Ambulatory Visit (INDEPENDENT_AMBULATORY_CARE_PROVIDER_SITE_OTHER): Payer: BC Managed Care – PPO | Admitting: Family Medicine

## 2013-12-21 VITALS — BP 132/76 | HR 82 | Temp 98.0°F | Wt 352.5 lb

## 2013-12-21 DIAGNOSIS — F331 Major depressive disorder, recurrent, moderate: Secondary | ICD-10-CM

## 2013-12-21 DIAGNOSIS — E1165 Type 2 diabetes mellitus with hyperglycemia: Principal | ICD-10-CM

## 2013-12-21 DIAGNOSIS — E1129 Type 2 diabetes mellitus with other diabetic kidney complication: Secondary | ICD-10-CM

## 2013-12-21 DIAGNOSIS — G4733 Obstructive sleep apnea (adult) (pediatric): Secondary | ICD-10-CM

## 2013-12-21 LAB — COMPREHENSIVE METABOLIC PANEL
ALT: 31 U/L (ref 0–35)
AST: 31 U/L (ref 0–37)
Albumin: 3.4 g/dL — ABNORMAL LOW (ref 3.5–5.2)
Alkaline Phosphatase: 59 U/L (ref 39–117)
BILIRUBIN TOTAL: 0.7 mg/dL (ref 0.2–1.2)
BUN: 7 mg/dL (ref 6–23)
CO2: 28 meq/L (ref 19–32)
CREATININE: 0.7 mg/dL (ref 0.4–1.2)
Calcium: 9 mg/dL (ref 8.4–10.5)
Chloride: 98 mEq/L (ref 96–112)
GFR: 103.72 mL/min (ref >60.00)
GLUCOSE: 153 mg/dL — AB (ref 70–99)
Potassium: 4.1 mEq/L (ref 3.5–5.1)
Sodium: 134 mEq/L — ABNORMAL LOW (ref 135–145)
Total Protein: 7.9 g/dL (ref 6.0–8.3)

## 2013-12-21 LAB — LIPID PANEL
Cholesterol: 119 mg/dL (ref 0–200)
HDL: 30.1 mg/dL — AB (ref >39.00)
LDL Cholesterol: 67 mg/dL (ref 0–99)
NonHDL: 88.9
Total CHOL/HDL Ratio: 4
Triglycerides: 111 mg/dL (ref 0.0–149.0)
VLDL: 22.2 mg/dL (ref 0.0–40.0)

## 2013-12-21 LAB — HEMOGLOBIN A1C: Hgb A1c MFr Bld: 6.8 % — ABNORMAL HIGH (ref 4.6–6.5)

## 2013-12-21 MED ORDER — PHENTERMINE HCL 30 MG PO CAPS: 30.0000 mg | ORAL_CAPSULE | ORAL | Status: DC

## 2013-12-21 NOTE — Assessment & Plan Note (Signed)
Picking up her CPAP today.  She is committed to wearing it.

## 2013-12-21 NOTE — Progress Notes (Signed)
Pre visit review using our clinic review tool, if applicable. No additional management support is needed unless otherwise documented below in the visit note. 

## 2013-12-21 NOTE — Assessment & Plan Note (Addendum)
She is still not following with endo. Advised her to STOP or at least cut back on sweet tea.  This alone will likely bring her a1c down a point or more. Encouraged her to continue with her weight loss. Orders Placed This Encounter  Procedures  . Hemoglobin A1c  . Comprehensive metabolic panel  . Lipid panel   Pneumovax UTD.  Discussed adding ACEI for renal protection- she wants to discuss after the holidays.

## 2013-12-21 NOTE — Assessment & Plan Note (Signed)
Well controlled on current dose of Wellbutrin. No changes made today.

## 2013-12-21 NOTE — Progress Notes (Signed)
38 yo female with h/o morbid obesity, DM, and depression, here for follow up.  1.  DM- now seeing Dr. Elvera LennoxGherghe. Has still not made follow up appointment despite my long talk with her at last office visit in 10/2013 about getting her diabetes under control and following up with endocrinology. Lab Results  Component Value Date   HGBA1C 8.6* 08/07/2013    On Metformin 1000 mg bid.   Urine micro positive in 01/2013. Has been working on cutting back on portions but admits to often "not trying hard." Pneumovax 10/20/09  Lab Results  Component Value Date   CHOL 102 08/07/2013   HDL 27.10* 08/07/2013   LDLCALC 54 08/07/2013   TRIG 107.0 08/07/2013   CHOLHDL 4 08/07/2013    2. OSA- She is going to get CPAP machine today.  She will try to wear it.  3. Obesity- restarted phentermine 30 mg daily.. She has noticed an improvement- cut back on amount she is eating but feels she could lose more if she cut back on sweet tea- drinks sweet tea "all day long."  She has lost 40 lbs since last year.  Wt Readings from Last 3 Encounters:  12/21/13 352 lb 8 oz (159.893 kg)  10/27/13 363 lb (164.656 kg)  09/25/13 368 lb 8 oz (167.151 kg)    4.  Depression- symptoms remain controlled with Wellbutrin.  Tough time of year but feels she is coping ok.  Does get tearful at times but overall feels good.  Denies any SI or HI.   Patient Active Problem List   Diagnosis Date Noted  . Edema 01/19/2013  . OSA (obstructive sleep apnea) 09/15/2012  . Obesity, morbid, BMI 50 or higher 11/12/2011  . FATIGUE 01/24/2010  . 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 POLYCYSTIC OVARIES 07/08/2009  . DEPRESSION, MAJOR, RECURRENT, MODERATE 07/08/2009   Past Medical History  Diagnosis Date  . Depression   . Diabetes mellitus   . PCOS (polycystic ovarian syndrome)   . Obesity    Past Surgical History  Procedure Laterality Date  . Cholecystectomy    . Breast surgery      breast  reduction   History  Substance Use Topics  . Smoking status: Never Smoker   . Smokeless tobacco: Never Used  . Alcohol Use: Yes     Comment: 4-5 drinks per week   Family History  Problem Relation Age of Onset  . Heart attack Father 3048  . Cancer Mother     adenoid cystic carcinoma  . Emphysema Paternal Grandfather   . Emphysema Maternal Grandfather   . Heart disease Maternal Grandmother   . Cancer Maternal Grandfather   . Cancer Paternal Grandfather    Allergies  Allergen Reactions  . Morphine     REACTION: vomiting   Current Outpatient Prescriptions on File Prior to Visit  Medication Sig Dispense Refill  . buPROPion (WELLBUTRIN SR) 150 MG 12 hr tablet 1 TAB BY MOUTH EVERY MORNING. 30 tablet 6  . Ferrous Sulfate (IRON) 28 MG TABS Take 1 tablet by mouth daily.    . folic acid (FOLVITE) 1 MG tablet Take 1 mg by mouth daily.    Marland Kitchen. GLIPIZIDE XL 2.5 MG 24 hr tablet TAKE 1 TABLET (2.5 MG TOTAL) BY MOUTH DAILY WITH BREAKFAST. 30 tablet 2  . glucose blood test strip 1 each by Other route as directed. Use as instructed     . hydrochlorothiazide (HYDRODIURIL) 12.5 MG tablet TAKE ONE TABLET (  12.5 MG TOTAL) BY MOUTH DAILY. 30 tablet 2  . Lancets (ONETOUCH ULTRASOFT) lancets 1 each by Other route as directed. Use as instructed     . metFORMIN (GLUCOPHAGE) 1000 MG tablet TAKE 1 TABLET  BY MOUTH 2 (TWO) TIMES DAILY WITH A MEAL. 60 tablet 1  . tranexamic acid (LYSTEDA) 650 MG TABS Take by mouth. Take two tablets three times a day during menstrual cycle      No current facility-administered medications on file prior to visit.      The PMH, PSH, Social History, Family History, Medications, and allergies have been [facility], and have been updated if relevant.   Review of Systems       See HPI No episodes of hypoglycemia. No SI or HI No CP or SOB No palpitations No n/v/d No blurred vision + tingling in her feet- ongoing for years, no worse. No LE weakness Physical Exam BP  132/76 mmHg  Pulse 82  Temp(Src) 98 F (36.7 C) (Oral)  Wt 352 lb 8 oz (159.893 kg)  SpO2 98%  LMP 12/07/2013 (Within Days)  General:  morbidly obese, NAD Mouth:  good dentition.   Neck:  No deformities, masses, or tenderness noted. Lungs:  Normal respiratory effort, chest expands symmetrically. Lungs are clear to auscultation, no crackles or wheezes. Heart:  Normal rate and regular rhythm. S1 and S2 normal without gallop, murmur, click, rub or other extra sounds. Neurologic:  No cranial nerve deficits noted. Station and gait are normal. Plantar reflexes are down-going bilaterally. DTRs are symmetrical throughout. Sensory, motor and coordinative functions appear intact. Psych:   Good eye contact, not tearful today. Does appear anxious Cognition and judgment appear intact. Alert and cooperative with normal attention span and concentration. No apparent delusions, illusions, hallucinations  Diabetic foot exam: Normal inspection No skin breakdown No calluses  Normal DP pulses Normal sensation to light touch and monofilament Nails normal

## 2013-12-21 NOTE — Patient Instructions (Signed)
Great to see you. Happy Holidays!  We will call you with your lab results and you can view them online. 

## 2013-12-21 NOTE — Assessment & Plan Note (Signed)
Encouraged her to continue with weight loss. Phentermine rx printed and given to pt.

## 2014-01-07 ENCOUNTER — Other Ambulatory Visit: Payer: Self-pay | Admitting: Family Medicine

## 2014-01-14 ENCOUNTER — Other Ambulatory Visit: Payer: Self-pay | Admitting: Family Medicine

## 2014-01-27 ENCOUNTER — Other Ambulatory Visit: Payer: Self-pay | Admitting: Family Medicine

## 2014-01-27 NOTE — Telephone Encounter (Signed)
Last f/u 12/2013. pls advise

## 2014-01-27 NOTE — Telephone Encounter (Signed)
Attempted to call in Rx; pharmacy line busy 

## 2014-01-27 NOTE — Telephone Encounter (Signed)
Rx called in to requested pharmacy 

## 2014-02-03 ENCOUNTER — Telehealth: Payer: Self-pay | Admitting: Family Medicine

## 2014-02-03 NOTE — Telephone Encounter (Signed)
She should schedule an appt this month or next month to see me- please make this a 30 min appt.

## 2014-02-03 NOTE — Telephone Encounter (Signed)
Patient was seen in December.  Patient said she wasn't told when to follow up with you.  Patient said she usually comes every 1-2 months.  When would you like her to follow up?

## 2014-03-01 ENCOUNTER — Other Ambulatory Visit: Payer: Self-pay | Admitting: Family Medicine

## 2014-03-07 ENCOUNTER — Other Ambulatory Visit: Payer: Self-pay | Admitting: Family Medicine

## 2014-03-09 ENCOUNTER — Ambulatory Visit (INDEPENDENT_AMBULATORY_CARE_PROVIDER_SITE_OTHER): Payer: BC Managed Care – PPO | Admitting: Family Medicine

## 2014-03-09 ENCOUNTER — Encounter: Payer: Self-pay | Admitting: Family Medicine

## 2014-03-09 VITALS — BP 130/78 | HR 80 | Temp 98.4°F | Wt 339.2 lb

## 2014-03-09 DIAGNOSIS — E1121 Type 2 diabetes mellitus with diabetic nephropathy: Secondary | ICD-10-CM

## 2014-03-09 DIAGNOSIS — E1129 Type 2 diabetes mellitus with other diabetic kidney complication: Secondary | ICD-10-CM

## 2014-03-09 DIAGNOSIS — E1165 Type 2 diabetes mellitus with hyperglycemia: Secondary | ICD-10-CM

## 2014-03-09 DIAGNOSIS — G4733 Obstructive sleep apnea (adult) (pediatric): Secondary | ICD-10-CM

## 2014-03-09 MED ORDER — PHENTERMINE HCL 30 MG PO CAPS: 30.0000 mg | ORAL_CAPSULE | Freq: Every morning | ORAL | Status: DC

## 2014-03-09 NOTE — Progress Notes (Signed)
Pre visit review using our clinic review tool, if applicable. No additional management support is needed unless otherwise documented below in the visit note. 

## 2014-03-09 NOTE — Assessment & Plan Note (Signed)
Improving. Encouraged her to continue cutting back on sugar and increase physical activity. Rx for phentermine refilled today.

## 2014-03-09 NOTE — Assessment & Plan Note (Signed)
>  25 minutes spent in face to face time with patient, >50% spent in counselling or coordination of care Improved control most likely. Not yet due for a1c- lab orders entered for her to return for labs after 3/21 when she will be due. Continue current rx for now. I did advise that she check her FSBS at least 3 times a week now that she is losing weight. She said she would think about it.  We have discussed ACEI- will consider in future.

## 2014-03-09 NOTE — Progress Notes (Signed)
39 yo female with h/o morbid obesity, DM, and depression, here for follow up.  1.  DM- not checking FSBS anymore.  I spent a long time today discussing the importance of checking her FSBS at least 3 times weekly now that she is losing weight.  We may need to decrease her rxs to avoid hypoglycemia.  She denies any symptoms of hyopglycemia.  Lab Results  Component Value Date   HGBA1C 6.8* 12/21/2013    On Metformin 1000 mg bid and glipizide 2.5 mg.   Urine micro positive in 01/2013. Pneumovax 10/20/09  Lab Results  Component Value Date   CHOL 119 12/21/2013   HDL 30.10* 12/21/2013   LDLCALC 67 12/21/2013   TRIG 111.0 12/21/2013   CHOLHDL 4 12/21/2013    2. OSA- She still has not used her CPAP.  Says it makes her feel trapped.    3. Obesity- has lost 57 pounds total.  restarted phentermine 30 mg daily.. She has noticed an improvement- cut back on amount she is eating and now only drinks one sweet tea per day.   Wt Readings from Last 3 Encounters:  03/09/14 339 lb 4 oz (153.883 kg)  12/21/13 352 lb 8 oz (159.893 kg)  10/27/13 363 lb (164.656 kg)    4.  Depression- symptoms remain controlled with Wellbutrin.  Denies any SI or HI.   Patient Active Problem List   Diagnosis Date Noted  . Edema 01/19/2013  . OSA (obstructive sleep apnea) 09/15/2012  . Obesity, morbid, BMI 50 or higher 11/12/2011  . FATIGUE 01/24/2010  . 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 POLYCYSTIC OVARIES 07/08/2009  . DEPRESSION, MAJOR, RECURRENT, MODERATE 07/08/2009   Past Medical History  Diagnosis Date  . Depression   . Diabetes mellitus   . PCOS (polycystic ovarian syndrome)   . Obesity    Past Surgical History  Procedure Laterality Date  . Cholecystectomy    . Breast surgery      breast reduction   History  Substance Use Topics  . Smoking status: Never Smoker   . Smokeless tobacco: Never Used  . Alcohol Use: Yes     Comment: 4-5 drinks per week    Family History  Problem Relation Age of Onset  . Heart attack Father 1648  . Cancer Mother     adenoid cystic carcinoma  . Emphysema Paternal Grandfather   . Emphysema Maternal Grandfather   . Heart disease Maternal Grandmother   . Cancer Maternal Grandfather   . Cancer Paternal Grandfather    Allergies  Allergen Reactions  . Morphine     REACTION: vomiting   Current Outpatient Prescriptions on File Prior to Visit  Medication Sig Dispense Refill  . buPROPion (WELLBUTRIN SR) 150 MG 12 hr tablet 1 TAB BY MOUTH EVERY MORNING. 30 tablet 5  . Ferrous Sulfate (IRON) 28 MG TABS Take 1 tablet by mouth daily.    . folic acid (FOLVITE) 1 MG tablet Take 1 mg by mouth daily.    Marland Kitchen. GLIPIZIDE XL 2.5 MG 24 hr tablet TAKE 1 TABLET (2.5 MG TOTAL) BY MOUTH DAILY WITH BREAKFAST. 30 tablet 2  . glucose blood test strip 1 each by Other route as directed. Use as instructed     . hydrochlorothiazide (HYDRODIURIL) 12.5 MG tablet TAKE ONE TABLET (12.5 MG TOTAL) BY MOUTH DAILY. 30 tablet 5  . Lancets (ONETOUCH ULTRASOFT) lancets 1 each by Other route as directed. Use as instructed     .  metFORMIN (GLUCOPHAGE) 1000 MG tablet TAKE 1 TABLET  BY MOUTH 2 (TWO) TIMES DAILY WITH A MEAL. 60 tablet 2  . tranexamic acid (LYSTEDA) 650 MG TABS Take by mouth. Take two tablets three times a day during menstrual cycle      No current facility-administered medications on file prior to visit.      The PMH, PSH, Social History, Family History, Medications, and allergies have been reviewed in [facility], and have been updated if relevant.   Review of Systems       See HPI No episodes of hypoglycemia. No SI or HI No CP or SOB No palpitations No n/v/d No blurred vision No LE weakness No symptoms of anxiety or depression Sleeping well   Physical Exam BP 130/78 mmHg  Pulse 80  Temp(Src) 98.4 F (36.9 C) (Oral)  Wt 339 lb 4 oz (153.883 kg)  SpO2 97%  LMP 03/07/2014  General:  morbidly obese, NAD Mouth:  good  dentition.   Neck:  No deformities, masses, or tenderness noted. Lungs:  Normal respiratory effort, chest expands symmetrically. Lungs are clear to auscultation, no crackles or wheezes. Heart:  Normal rate and regular rhythm. S1 and S2 normal without gallop, murmur, click, rub or other extra sounds. Neurologic:  No cranial nerve deficits noted. Station and gait are normal. Plantar reflexes are down-going bilaterally. DTRs are symmetrical throughout. Sensory, motor and coordinative functions appear intact. Psych:   Good eye contact, not tearful today. Does appear anxious Cognition and judgment appear intact. Alert and cooperative with normal attention span and concentration. No apparent delusions, illusions, hallucinations

## 2014-03-09 NOTE — Patient Instructions (Signed)
Good to see you. Please come back for lab visit on or after 03/22/2014.  Happy birthday!

## 2014-03-23 ENCOUNTER — Other Ambulatory Visit: Payer: BC Managed Care – PPO

## 2014-03-31 ENCOUNTER — Other Ambulatory Visit (INDEPENDENT_AMBULATORY_CARE_PROVIDER_SITE_OTHER): Payer: BC Managed Care – PPO

## 2014-03-31 DIAGNOSIS — E1165 Type 2 diabetes mellitus with hyperglycemia: Principal | ICD-10-CM

## 2014-03-31 DIAGNOSIS — E1129 Type 2 diabetes mellitus with other diabetic kidney complication: Secondary | ICD-10-CM

## 2014-03-31 DIAGNOSIS — E1121 Type 2 diabetes mellitus with diabetic nephropathy: Secondary | ICD-10-CM

## 2014-03-31 LAB — MICROALBUMIN / CREATININE URINE RATIO
CREATININE, U: 189.3 mg/dL
MICROALB UR: 1.9 mg/dL (ref 0.0–1.9)
Microalb Creat Ratio: 1 mg/g (ref 0.0–30.0)

## 2014-03-31 LAB — HEMOGLOBIN A1C: Hgb A1c MFr Bld: 6 % (ref 4.6–6.5)

## 2014-04-01 ENCOUNTER — Encounter: Payer: Self-pay | Admitting: Family Medicine

## 2014-04-05 ENCOUNTER — Other Ambulatory Visit: Payer: Self-pay | Admitting: Family Medicine

## 2014-04-14 ENCOUNTER — Other Ambulatory Visit: Payer: Self-pay | Admitting: Family Medicine

## 2014-04-20 ENCOUNTER — Telehealth: Payer: Self-pay | Admitting: Family Medicine

## 2014-04-20 NOTE — Telephone Encounter (Signed)
Pt called wanting to know when she needed to follow up with dr Dayton Martesaron Please advise

## 2014-04-20 NOTE — Telephone Encounter (Signed)
She will need to f/u before her Rx runs out 5/8

## 2014-04-21 NOTE — Telephone Encounter (Signed)
Appointment 5/4 pt aware °

## 2014-05-05 ENCOUNTER — Ambulatory Visit (INDEPENDENT_AMBULATORY_CARE_PROVIDER_SITE_OTHER): Payer: BC Managed Care – PPO | Admitting: Family Medicine

## 2014-05-05 ENCOUNTER — Encounter: Payer: Self-pay | Admitting: Family Medicine

## 2014-05-05 ENCOUNTER — Telehealth: Payer: Self-pay

## 2014-05-05 VITALS — BP 140/80 | HR 85 | Temp 97.9°F | Wt 331.0 lb

## 2014-05-05 DIAGNOSIS — E1121 Type 2 diabetes mellitus with diabetic nephropathy: Secondary | ICD-10-CM | POA: Diagnosis not present

## 2014-05-05 DIAGNOSIS — G4733 Obstructive sleep apnea (adult) (pediatric): Secondary | ICD-10-CM | POA: Diagnosis not present

## 2014-05-05 DIAGNOSIS — F331 Major depressive disorder, recurrent, moderate: Secondary | ICD-10-CM | POA: Diagnosis not present

## 2014-05-05 MED ORDER — PHENTERMINE HCL 30 MG PO CAPS: 30.0000 mg | ORAL_CAPSULE | Freq: Every morning | ORAL | Status: DC

## 2014-05-05 MED ORDER — LISINOPRIL 5 MG PO TABS: 5.0000 mg | ORAL_TABLET | Freq: Every day | ORAL | Status: DC

## 2014-05-05 NOTE — Telephone Encounter (Signed)
Noted. Thank you for the update.

## 2014-05-05 NOTE — Progress Notes (Signed)
Pre visit review using our clinic review tool, if applicable. No additional management support is needed unless otherwise documented below in the visit note. 

## 2014-05-05 NOTE — Progress Notes (Signed)
39 yo female with h/o morbid obesity, DM, and depression, here for follow up.  DM- not checking FSBS anymore, despite my urging her to do so with her recent weight loss.  We may need to decrease her rxs to avoid hypoglycemia.  For past month, she has had what she thinks is an episode of hypoglycemia on average once a week- shaky, sweaty- resolves when she eats.  Lab Results  Component Value Date   HGBA1C 6.0 03/31/2014    On Metformin 1000 mg bid and glipizide 2.5 mg.   Urine micro positive in 01/2013. Pneumovax 10/20/09  Lab Results  Component Value Date   CHOL 119 12/21/2013   HDL 30.10* 12/21/2013   LDLCALC 67 12/21/2013   TRIG 111.0 12/21/2013   CHOLHDL 4 12/21/2013    2. OSA- She still has not used her CPAP.  Says it makes her feel trapped.    3. Obesity- continues to lose weight with phentermine.  Trying to increase physical activity- parks farther away, takes stairs, etc.  Denies any CP, SOB, blurred vision or HA.  Wt Readings from Last 3 Encounters:  05/05/14 331 lb (150.141 kg)  03/09/14 339 lb 4 oz (153.883 kg)  12/21/13 352 lb 8 oz (159.893 kg)    4.  Depression- symptoms remain controlled with Wellbutrin.  Denies any SI or HI.   Patient Active Problem List   Diagnosis Date Noted  . Edema 01/19/2013  . OSA (obstructive sleep apnea) 09/15/2012  . Obesity, morbid, BMI 50 or higher 11/12/2011  . FATIGUE 01/24/2010  . 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 POLYCYSTIC OVARIES 07/08/2009  . DEPRESSION, MAJOR, RECURRENT, MODERATE 07/08/2009   Past Medical History  Diagnosis Date  . Depression   . Diabetes mellitus   . PCOS (polycystic ovarian syndrome)   . Obesity    Past Surgical History  Procedure Laterality Date  . Cholecystectomy    . Breast surgery      breast reduction   History  Substance Use Topics  . Smoking status: Never Smoker   . Smokeless tobacco: Never Used  . Alcohol Use: Yes     Comment: 4-5 drinks per week   Family  History  Problem Relation Age of Onset  . Heart attack Father [id number]  . Cancer Mother     adenoid cystic carcinoma  . Emphysema Paternal Grandfather   . Emphysema Maternal Grandfather   . Heart disease Maternal Grandmother   . Cancer Maternal Grandfather   . Cancer Paternal Grandfather    Allergies  Allergen Reactions  . Morphine     REACTION: vomiting   Current Outpatient Prescriptions on File Prior to Visit  Medication Sig Dispense Refill  . buPROPion (WELLBUTRIN SR) 150 MG 12 hr tablet 1 TAB BY MOUTH EVERY MORNING. 30 tablet 5  . Ferrous Sulfate (IRON) 28 MG TABS Take 1 tablet by mouth daily.    . folic acid (FOLVITE) 1 MG tablet Take 1 mg by mouth daily.    Marland Kitchen. GLIPIZIDE XL 2.5 MG 24 hr tablet TAKE 1 TABLET (2.5 MG TOTAL) BY MOUTH DAILY WITH BREAKFAST. 30 tablet 2  . hydrochlorothiazide (HYDRODIURIL) 12.5 MG tablet TAKE ONE TABLET (12.5 MG TOTAL) BY MOUTH DAILY. 30 tablet 5  . metFORMIN (GLUCOPHAGE) 1000 MG tablet TAKE 1 TABLET  BY MOUTH 2 (TWO) TIMES DAILY WITH A MEAL. 60 tablet 4  . phentermine 30 MG capsule Take 1 capsule (30 mg total) by mouth every morning. 30 capsule 1   No  current facility-administered medications on file prior to visit.      The PMH, PSH, Social History, Family History, Medications, and allergies have been reviewed in [facility], and have been updated if relevant.   Review of Systems    Review of Systems  Constitutional: Negative.   HENT: Negative.   Eyes: Negative.   Respiratory: Negative.   Cardiovascular: Negative.   Gastrointestinal: Negative.   Endocrine: Negative.   Musculoskeletal: Negative.   Skin: Negative.   Allergic/Immunologic: Negative.   Neurological: Negative.   Hematological: Negative.   Psychiatric/Behavioral: Negative.      Physical Exam BP 140/80 mmHg  Pulse 85  Temp(Src) 97.9 F (36.6 C) (Tympanic)  Wt 331 lb (150.141 kg)  SpO2 98%  General:  morbidly obese, NAD Mouth:  good dentition.   Neck:  No deformities, masses,  or tenderness noted. Lungs:  Normal respiratory effort, chest expands symmetrically. Lungs are clear to auscultation, no crackles or wheezes. Heart:  Normal rate and regular rhythm. S1 and S2 normal without gallop, murmur, click, rub or other extra sounds. Neurologic:  No cranial nerve deficits noted. Station and gait are normal. Plantar reflexes are down-going bilaterally. DTRs are symmetrical throughout. Sensory, motor and coordinative functions appear intact. Ext:  No edema Psych:   Good eye contact, not tearful today. Does appear anxious Cognition and judgment appear intact. Alert and cooperative with normal attention span and concentration. No apparent delusions, illusions, hallucinations

## 2014-05-05 NOTE — Telephone Encounter (Signed)
Pt left v/m; pt was seen earlier today and pt can get nurse at work to ck BP in 2 weeks and pt will cb with BP reading. Pt is not going to make appt in 2 weeks at this time.

## 2014-05-05 NOTE — Assessment & Plan Note (Signed)
Improving Encouraged her to continue. Rx for phentermine refilled. Follow up in 3 months. The patient indicates understanding of these issues and agrees with the plan.

## 2014-05-05 NOTE — Patient Instructions (Signed)
Good to see you. I am so proud of you!  Keep up the great work!  We are stopping Glipizide and HCTZ and starting Lisinopril 5 mg daily. Please ask your nurse at work to check your blood pressure in 1 week and again in 2 weeks.  Please follow up with me in 3 months.

## 2014-05-05 NOTE — Assessment & Plan Note (Addendum)
Now well controlled with symptoms of hypoglycemia with continued weight loss. D/c glipizide. Continue current dose of Metformin. D/c HCTZ and add lisinopril 5 mg daily for renal protection- she will either have RN at work check her BP or follow up with me here in 2 weeks. Discussed signs and symptoms of orthostatic hypotension.

## 2014-05-13 NOTE — Telephone Encounter (Signed)
Pt left v/m; today at 4:15 pm BP was 136/74. Pt has been taking new med for 8 days. Pt plans to have nurse at work take BP end of next week also.

## 2014-05-13 NOTE — Telephone Encounter (Signed)
Great! Thanks for the update.

## 2014-05-24 NOTE — Telephone Encounter (Signed)
Pt called and wanted to let PCP know 05/20/14 -  BP reading was 124/82 .  Best number

## 2014-05-24 NOTE — Telephone Encounter (Signed)
Perfect.  Thanks for the update!

## 2014-06-04 ENCOUNTER — Telehealth: Payer: Self-pay

## 2014-06-04 NOTE — Telephone Encounter (Signed)
We need to re evaluate her blood pressure before restarting it.  Can she have it checked at work or make an appt to see me?

## 2014-06-04 NOTE — Telephone Encounter (Signed)
Pt left v/m after 4 pm that she would like to restart her waterpill due to her feet swelling. Unable to reach pt by phone for more info. Pt last seen 05/05/14.

## 2014-06-07 ENCOUNTER — Telehealth: Payer: Self-pay | Admitting: Family Medicine

## 2014-06-07 NOTE — Telephone Encounter (Signed)
Pt had bp taken  122/70 about 10 mins ago.  Call cell if you have any questions

## 2014-06-07 NOTE — Telephone Encounter (Signed)
Spoke to pt and advised per Dr Dayton MartesAron. Pt verbally expressed understanding and states she will try to have it checked at work and contact office back with reading

## 2014-06-07 NOTE — Telephone Encounter (Signed)
Spoke to pt and advised per Dr Dayton MartesAron. Pt verbally expressed understanding. Pt states that she is not willing to try compression hose because she "wears sandals" but states the swelling was "bad enough to have to call."

## 2014-06-07 NOTE — Telephone Encounter (Signed)
That's on the low side and I am concerned that if we add a diuretic, her blood pressure will drop.  How severe is the swelling?  Has she tried compression hose?

## 2014-06-07 NOTE — Telephone Encounter (Signed)
Lm on pts vm requesting a call back 

## 2014-06-08 MED ORDER — BENAZEPRIL-HYDROCHLOROTHIAZIDE 5-6.25 MG PO TABS: 1.0000 | ORAL_TABLET | Freq: Every day | ORAL | Status: DC

## 2014-06-08 NOTE — Telephone Encounter (Signed)
Lm on pts vm requesting a call back 

## 2014-06-08 NOTE — Telephone Encounter (Signed)
Please advise her to STOP taking current dose of Lisinopril and and I sent in new rx for benazapril/hctz (very low dose) to add diuretic to current rxs.  Please continue having  BP checked at work.

## 2014-06-25 ENCOUNTER — Ambulatory Visit (INDEPENDENT_AMBULATORY_CARE_PROVIDER_SITE_OTHER): Payer: BC Managed Care – PPO | Admitting: Family Medicine

## 2014-06-25 ENCOUNTER — Encounter: Payer: Self-pay | Admitting: Family Medicine

## 2014-06-25 VITALS — BP 138/66 | HR 95 | Temp 98.9°F | Wt 324.5 lb

## 2014-06-25 DIAGNOSIS — N390 Urinary tract infection, site not specified: Secondary | ICD-10-CM | POA: Insufficient documentation

## 2014-06-25 DIAGNOSIS — N3001 Acute cystitis with hematuria: Secondary | ICD-10-CM | POA: Diagnosis not present

## 2014-06-25 DIAGNOSIS — R300 Dysuria: Secondary | ICD-10-CM | POA: Diagnosis not present

## 2014-06-25 LAB — POCT URINALYSIS DIPSTICK
BILIRUBIN UA: NEGATIVE
Glucose, UA: NEGATIVE
KETONES UA: NEGATIVE
NITRITE UA: NEGATIVE
Protein, UA: POSITIVE
Spec Grav, UA: >=1.03
Urobilinogen, UA: 4
pH, UA: 6

## 2014-06-25 LAB — POCT UA - MICROSCOPIC ONLY

## 2014-06-25 MED ORDER — SULFAMETHOXAZOLE-TRIMETHOPRIM 800-160 MG PO TABS: 1.0000 | ORAL_TABLET | Freq: Two times a day (BID) | ORAL | Status: DC

## 2014-06-25 NOTE — Addendum Note (Signed)
Addended by: Desmond Dike on: 06/25/2014 09:50 AM   Modules accepted: Orders

## 2014-06-25 NOTE — Progress Notes (Signed)
Pre visit review using our clinic review tool, if applicable. No additional management support is needed unless otherwise documented below in the visit note. 

## 2014-06-25 NOTE — Patient Instructions (Signed)
Start and complete antibiotics.  Push water.  We will call with culture results.

## 2014-06-25 NOTE — Assessment & Plan Note (Signed)
Simple ncomplicated. Treat with bactrim x 3 days.

## 2014-06-25 NOTE — Addendum Note (Signed)
Addended byKerby Nora E on: 06/25/2014 01:19 PM   Modules accepted: SmartSet

## 2014-06-25 NOTE — Progress Notes (Signed)
   Subjective:    Patient ID: Kristy Conrad, female    DOB: 1975/11/10, 39 y.o.   MRN: [id number]  Dysuria  This is a new problem. The current episode started in the past 7 days ( 2 days). The problem has been waxing and waning. The quality of the pain is described as burning. The pain is at a severity of 5/10. The pain is moderate. There has been no fever. She is sexually active. There is no history of pyelonephritis. Associated symptoms include frequency and urgency. Pertinent negatives include no discharge, flank pain, hematuria, nausea or vomiting. Associated symptoms comments: Odor of urine. There is no history of catheterization, kidney stones, recurrent UTIs, a single kidney, urinary stasis or a urological procedure.      Review of Systems  Gastrointestinal: Negative for nausea and vomiting.  Genitourinary: Positive for dysuria, urgency and frequency. Negative for hematuria and flank pain.       Objective:   Physical Exam  Constitutional: Vital signs are normal. She appears well-developed and well-nourished. She is cooperative.  Non-toxic appearance. She does not appear ill. No distress.  HENT:  Head: Normocephalic.  Right Ear: Hearing, tympanic membrane, external ear and ear canal normal. Tympanic membrane is not erythematous, not retracted and not bulging.  Left Ear: Hearing, tympanic membrane, external ear and ear canal normal. Tympanic membrane is not erythematous, not retracted and not bulging.  Nose: No mucosal edema or rhinorrhea. Right sinus exhibits no maxillary sinus tenderness and no frontal sinus tenderness. Left sinus exhibits no maxillary sinus tenderness and no frontal sinus tenderness.  Mouth/Throat: Uvula is midline, oropharynx is clear and moist and mucous membranes are normal.  Eyes: Conjunctivae, EOM and lids are normal. Pupils are equal, round, and reactive to light. Lids are everted and swept, no foreign bodies found.  Neck: Trachea normal and normal range of  motion. Neck supple. Carotid bruit is not present. No thyroid mass and no thyromegaly present.  Cardiovascular: Normal rate, regular rhythm, S1 normal, S2 normal, normal heart sounds, intact distal pulses and normal pulses.  Exam reveals no gallop and no friction rub.   No murmur heard. Pulmonary/Chest: Effort normal and breath sounds normal. No tachypnea. No respiratory distress. She has no decreased breath sounds. She has no wheezes. She has no rhonchi. She has no rales.  Abdominal: Soft. Normal appearance and bowel sounds are normal. There is no tenderness. There is no CVA tenderness.  Neurological: She is alert.  Skin: Skin is warm, dry and intact. No rash noted.  Psychiatric: Her speech is normal and behavior is normal. Judgment and thought content normal. Her mood appears not anxious. Cognition and memory are normal. She does not exhibit a depressed mood.          Assessment & Plan:

## 2014-06-28 ENCOUNTER — Telehealth: Payer: Self-pay | Admitting: Family Medicine

## 2014-06-28 LAB — URINE CULTURE: Colony Count: 50000

## 2014-06-28 NOTE — Telephone Encounter (Signed)
Pt returned your call She stated you can call [phone] and leave a detailed message if she doesn't answer

## 2014-06-28 NOTE — Telephone Encounter (Signed)
See Result Note from 06/25/2014.

## 2014-07-09 ENCOUNTER — Other Ambulatory Visit: Payer: Self-pay | Admitting: Family Medicine

## 2014-07-12 NOTE — Telephone Encounter (Signed)
Last office visit 06/25/14 - Acute. Last refilled 05/05/14 #30 with 2 refills.  Is it okay to refill?

## 2014-07-12 NOTE — Telephone Encounter (Signed)
Rx called in as directed.   

## 2014-08-09 ENCOUNTER — Other Ambulatory Visit: Payer: Self-pay | Admitting: Family Medicine

## 2014-08-09 ENCOUNTER — Encounter: Payer: Self-pay | Admitting: Family Medicine

## 2014-08-09 ENCOUNTER — Ambulatory Visit (INDEPENDENT_AMBULATORY_CARE_PROVIDER_SITE_OTHER): Payer: BC Managed Care – PPO | Admitting: Family Medicine

## 2014-08-09 VITALS — BP 132/70 | HR 88 | Temp 98.4°F | Wt 326.2 lb

## 2014-08-09 DIAGNOSIS — F331 Major depressive disorder, recurrent, moderate: Secondary | ICD-10-CM | POA: Diagnosis not present

## 2014-08-09 DIAGNOSIS — G4733 Obstructive sleep apnea (adult) (pediatric): Secondary | ICD-10-CM

## 2014-08-09 DIAGNOSIS — E1121 Type 2 diabetes mellitus with diabetic nephropathy: Secondary | ICD-10-CM

## 2014-08-09 MED ORDER — METFORMIN HCL 1000 MG PO TABS: ORAL_TABLET | ORAL | Status: DC

## 2014-08-09 MED ORDER — BUPROPION HCL ER (SR) 150 MG PO TB12: ORAL_TABLET | ORAL | Status: DC

## 2014-08-09 MED ORDER — PHENTERMINE HCL 37.5 MG PO CAPS: 37.5000 mg | ORAL_CAPSULE | ORAL | Status: DC

## 2014-08-09 MED ORDER — PHENTERMINE HCL 30 MG PO CAPS: 30.0000 mg | ORAL_CAPSULE | Freq: Every morning | ORAL | Status: DC

## 2014-08-09 NOTE — Progress Notes (Signed)
39 yo female with h/o morbid obesity, DM, and depression, here for follow up.   DM- not checking FSBS anymore, despite my urging her to do so with her recent weight loss. No recent hypoglycemia. Lab Results  Component Value Date   HGBA1C 6.0 03/31/2014    On Metformin 1000 mg bid.   Urine micro positive in 01/2013 and then negative on 03/31/14. Pneumovax 10/20/09  Lab Results  Component Value Date   CHOL 119 12/21/2013   HDL 30.10* 12/21/2013   LDLCALC 67 12/21/2013   TRIG 111.0 12/21/2013   CHOLHDL 4 12/21/2013     OSA- She still has not used her CPAP.  Says it makes her feel trapped.     Obesity- continues to lose weight with phentermine.  Trying to increase physical activity- parking farther away at work.  Wondering if th 30 mg dosage is not working as well anymore.  Wt Readings from Last 3 Encounters:  08/09/14 326 lb 4 oz (147.986 kg)  06/25/14 324 lb 8 oz (147.192 kg)  05/05/14 331 lb (150.141 kg)     Depression- symptoms remain controlled with Wellbutrin.  Denies any SI or HI.   Patient Active Problem List   Diagnosis Date Noted  . Edema 01/19/2013  . OSA (obstructive sleep apnea) 09/15/2012  . Obesity, morbid, BMI 50 or higher 11/12/2011  . FATIGUE 01/24/2010  . 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 POLYCYSTIC OVARIES 07/08/2009  . DEPRESSION, MAJOR, RECURRENT, MODERATE 07/08/2009   Past Medical History  Diagnosis Date  . Depression   . Diabetes mellitus   . PCOS (polycystic ovarian syndrome)   . Obesity    Past Surgical History  Procedure Laterality Date  . Cholecystectomy    . Breast surgery      breast reduction   History  Substance Use Topics  . Smoking status: Never Smoker   . Smokeless tobacco: Never Used  . Alcohol Use: Yes     Comment: 4-5 drinks per week   Family History  Problem Relation Age of Onset  . Heart attack Father 32  . Cancer Mother     adenoid cystic carcinoma  . Emphysema Paternal Grandfather   .  Emphysema Maternal Grandfather   . Heart disease Maternal Grandmother   . Cancer Maternal Grandfather   . Cancer Paternal Grandfather    Allergies  Allergen Reactions  . Morphine     REACTION: vomiting   Current Outpatient Prescriptions on File Prior to Visit  Medication Sig Dispense Refill  . benazepril-hydrochlorthiazide (LOTENSIN HCT) 5-6.25 MG per tablet Take 1 tablet by mouth daily. 30 tablet 1  . Ferrous Sulfate (IRON) 28 MG TABS Take 1 tablet by mouth daily.    . folic acid (FOLVITE) 1 MG tablet Take 1 mg by mouth daily.    . tranexamic acid (LYSTEDA) 650 MG TABS tablet Take 1,300 mg by mouth 3 (three) times daily. While on menstrual cycle     No current facility-administered medications on file prior to visit.      The PMH, PSH, Social History, Family History, Medications, and allergies have been reviewed in [facility], and have been updated if relevant.   Review of Systems    Review of Systems  Constitutional: Negative.   HENT: Negative.   Eyes: Negative.   Respiratory: Negative.   Cardiovascular: Negative.   Gastrointestinal: Negative.   Endocrine: Negative.   Musculoskeletal: Negative.   Skin: Negative.   Allergic/Immunologic: Negative.   Neurological: Negative.   Hematological: Negative.  Psychiatric/Behavioral: Negative.      Physical Exam BP 132/70 mmHg  Pulse 88  Temp(Src) 98.4 F (36.9 C) (Oral)  Wt 326 lb 4 oz (147.986 kg)  SpO2 96%  LMP 07/26/2014  General:  morbidly obese, NAD Mouth:  good dentition.   Neck:  No deformities, masses, or tenderness noted. Lungs:  Normal respiratory effort, chest expands symmetrically. Lungs are clear to auscultation, no crackles or wheezes. Heart:  Normal rate and regular rhythm. S1 and S2 normal without gallop, murmur, click, rub or other extra sounds. Neurologic:  No cranial nerve deficits noted. Station and gait are normal. Plantar reflexes are down-going bilaterally. DTRs are symmetrical throughout. Sensory,  motor and coordinative functions appear intact. Ext:  No edema Psych:   Good eye contact, not tearful today. Does appear anxious Cognition and judgment appear intact. Alert and cooperative with normal attention span and concentration. No apparent delusions, illusions, hallucinations

## 2014-08-09 NOTE — Patient Instructions (Signed)
Great to see you. Please come see me in 3 months  We will call you with your lab results.

## 2014-08-09 NOTE — Assessment & Plan Note (Signed)
Deteriorated. She would like to try higher dose of phentermine.  Aware of risks and possible side effects.  Rx given to pt for phentermine 37.5 mg daily.  Follow up in 3 months.

## 2014-08-09 NOTE — Assessment & Plan Note (Signed)
Due for labs today. On ACEI Has been well controlled.  No further hypoglycemia episodes. No changes made today.

## 2014-08-09 NOTE — Assessment & Plan Note (Signed)
Stable on current rxs. No changes made. 

## 2014-08-10 ENCOUNTER — Encounter: Payer: Self-pay | Admitting: Family Medicine

## 2014-08-10 LAB — COMPREHENSIVE METABOLIC PANEL
ALT: 13 U/L (ref 0–35)
AST: 19 U/L (ref 0–37)
Albumin: 3.7 g/dL (ref 3.5–5.2)
Alkaline Phosphatase: 54 U/L (ref 39–117)
BUN: 9 mg/dL (ref 6–23)
CO2: 28 meq/L (ref 19–32)
CREATININE: 0.7 mg/dL (ref 0.40–1.20)
Calcium: 9.3 mg/dL (ref 8.4–10.5)
Chloride: 99 mEq/L (ref 96–112)
GFR: 98.29 mL/min (ref >60.00)
Glucose, Bld: 82 mg/dL (ref 70–99)
POTASSIUM: 4 meq/L (ref 3.5–5.1)
SODIUM: 135 meq/L (ref 135–145)
TOTAL PROTEIN: 7.6 g/dL (ref 6.0–8.3)
Total Bilirubin: 0.3 mg/dL (ref 0.2–1.2)

## 2014-08-10 LAB — LIPID PANEL
CHOLESTEROL: 109 mg/dL (ref 0–200)
HDL: 34 mg/dL — ABNORMAL LOW (ref >39.00)
LDL CALC: 47 mg/dL (ref 0–99)
NonHDL: 74.94
TRIGLYCERIDES: 138 mg/dL (ref 0.0–149.0)
Total CHOL/HDL Ratio: 3
VLDL: 27.6 mg/dL (ref 0.0–40.0)

## 2014-08-10 LAB — HEMOGLOBIN A1C: Hgb A1c MFr Bld: 5.8 % (ref 4.6–6.5)

## 2014-10-04 ENCOUNTER — Telehealth: Payer: Self-pay

## 2014-10-04 NOTE — Telephone Encounter (Signed)
Congratulations!  Yes I would still like to see her.  Please update med list.

## 2014-10-04 NOTE — Telephone Encounter (Signed)
Spoke to pt and advised per Dr Dayton Martes. Pt states that she is currently spotting and cramping. She has a quantitative repeat on 10/05/2014 and is wanting to wait until the results are back to determine if the pregnancy is viable before updating med list. If bloodwork is ok, she is to have u/s to follow. Pt will contact office back tomorrow with update.

## 2014-10-04 NOTE — Telephone Encounter (Signed)
Pt left v/m; pt is pregnant and per advice of OBGYN pt stopped taking phentermine and the med to protect the kidneys (pt could not remember name of med). Pt also wants to know if Dr Dayton Martes wants pt to come in Nov for 3 month ck up or not. Pt last seen 08/2014. Pt request cb.

## 2014-10-25 ENCOUNTER — Telehealth: Payer: Self-pay | Admitting: *Deleted

## 2014-10-25 NOTE — Telephone Encounter (Signed)
I'm so sorry to hear this.  Thanks for the update.

## 2014-10-25 NOTE — Telephone Encounter (Signed)
FYI from patient, she will get the flu shot elsewhere and she did miscarry and she has started back taking the BP med and phentermine.

## 2014-11-23 ENCOUNTER — Other Ambulatory Visit: Payer: Self-pay | Admitting: Family Medicine

## 2014-11-24 ENCOUNTER — Ambulatory Visit (INDEPENDENT_AMBULATORY_CARE_PROVIDER_SITE_OTHER): Payer: BC Managed Care – PPO | Admitting: Family Medicine

## 2014-11-24 DIAGNOSIS — F331 Major depressive disorder, recurrent, moderate: Secondary | ICD-10-CM

## 2014-11-24 DIAGNOSIS — E1121 Type 2 diabetes mellitus with diabetic nephropathy: Secondary | ICD-10-CM

## 2014-11-24 MED ORDER — PHENTERMINE HCL 37.5 MG PO CAPS: 37.5000 mg | ORAL_CAPSULE | ORAL | Status: DC

## 2014-11-24 NOTE — Assessment & Plan Note (Signed)
Well controlled despite recent life stressors. No changes made.

## 2014-11-24 NOTE — Assessment & Plan Note (Signed)
Stable.  No changes made today. 

## 2014-11-24 NOTE — Assessment & Plan Note (Signed)
Improving. Phentermine refilled today- she is tolerating this well and aware of risks.

## 2014-11-24 NOTE — Progress Notes (Signed)
Pre visit review using our clinic review tool, if applicable. No additional management support is needed unless otherwise documented below in the visit note. 

## 2014-11-24 NOTE — Progress Notes (Signed)
39 yo female with h/o morbid obesity, DM, and depression, here for follow up.  Unfortunately had a miscarriage last month.  She feels she is coping well.  Feels Wellbutrin is working "as well as it can."  Denies any SI or HI.   DM- not checking FSBS anymore. . No recent hypoglycemia. Lab Results  Component Value Date   HGBA1C 5.8 08/09/2014    On Metformin 1000 mg bid.   Urine micro positive in 01/2013 and then negative on 03/31/14. Pneumovax 10/20/09  Lab Results  Component Value Date   CHOL 109 08/09/2014   HDL 34.00* 08/09/2014   LDLCALC 47 08/09/2014   TRIG 138.0 08/09/2014   CHOLHDL 3 08/09/2014     OSA- She still has not used her CPAP.  Says it makes her feel trapped.     Obesity- .  Trying to increase physical activity- parking farther away at work.  Restarted phentermine after the miscarriage.   Wt Readings from Last 3 Encounters:  11/24/14 323 lb (146.512 kg)  08/09/14 326 lb 4 oz (147.986 kg)  06/25/14 324 lb 8 oz (147.192 kg)     Patient Active Problem List   Diagnosis Date Noted  . Edema 01/19/2013  . OSA (obstructive sleep apnea) 09/15/2012  . Obesity, morbid, BMI 50 or higher (HCC) 11/12/2011  . FATIGUE 01/24/2010  . 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 POLYCYSTIC OVARIES 07/08/2009  . DEPRESSION, MAJOR, RECURRENT, MODERATE 07/08/2009   Past Medical History  Diagnosis Date  . Depression   . Diabetes mellitus   . PCOS (polycystic ovarian syndrome)   . Obesity    Past Surgical History  Procedure Laterality Date  . Cholecystectomy    . Breast surgery      breast reduction   Social History  Substance Use Topics  . Smoking status: Never Smoker   . Smokeless tobacco: Never Used  . Alcohol Use: Yes     Comment: 4-5 drinks per week   Family History  Problem Relation Age of Onset  . Heart attack Father 648  . Cancer Mother     adenoid cystic carcinoma  . Emphysema Paternal Grandfather   . Emphysema Maternal  Grandfather   . Heart disease Maternal Grandmother   . Cancer Maternal Grandfather   . Cancer Paternal Grandfather    Allergies  Allergen Reactions  . Morphine     REACTION: vomiting   Current Outpatient Prescriptions on File Prior to Visit  Medication Sig Dispense Refill  . benazepril-hydrochlorthiazide (LOTENSIN HCT) 5-6.25 MG per tablet Take 1 tablet by mouth daily. 30 tablet 1  . buPROPion (WELLBUTRIN SR) 150 MG 12 hr tablet TAKE 1 TAB BY MOUTH EVERY MORNING. 30 tablet 4  . Ferrous Sulfate (IRON) 28 MG TABS Take 1 tablet by mouth daily.    . folic acid (FOLVITE) 1 MG tablet Take 1 mg by mouth daily.    . metFORMIN (GLUCOPHAGE) 1000 MG tablet TAKE 1 TABLET  BY MOUTH 2 (TWO) TIMES DAILY WITH A MEAL. 60 tablet 2  . phentermine 37.5 MG capsule Take 1 capsule (37.5 mg total) by mouth every morning. 30 capsule 2  . tranexamic acid (LYSTEDA) 650 MG TABS tablet Take 1,300 mg by mouth 3 (three) times daily. While on menstrual cycle     No current facility-administered medications on file prior to visit.      The PMH, PSH, Social History, Family History, Medications, and allergies [facility], and have been updated if  relevant.   Review of Systems    Review of Systems  Constitutional: Negative.   HENT: Negative.   Eyes: Negative.   Respiratory: Negative.   Cardiovascular: Negative.   Gastrointestinal: Negative.   Endocrine: Negative.   Musculoskeletal: Negative.   Skin: Negative.   Allergic/Immunologic: Negative.   Neurological: Negative.   Hematological: Negative.   Psychiatric/Behavioral: Negative.      Physical Exam BP 120/78 mmHg  Pulse 73  Temp(Src) 97.3 F (36.3 C) (Oral)  Wt 323 lb (146.512 kg)  SpO2 98%  General:  morbidly obese, NAD Mouth:  good dentition.   Neck:  No deformities, masses, or tenderness noted. Lungs:  Normal respiratory effort, chest expands symmetrically. Lungs are clear to auscultation, no crackles or wheezes. Heart:   Normal rate and regular rhythm. S1 and S2 normal without gallop, murmur, click, rub or other extra sounds. Neurologic:  No cranial nerve deficits noted. Station and gait are normal. Plantar reflexes are down-going bilaterally. DTRs are symmetrical throughout. Sensory, motor and coordinative functions appear intact. Ext:  No edema Psych:   Good eye contact, not tearful today. Does appear anxious Cognition and judgment appear intact. Alert and cooperative with normal attention span and concentration. No apparent delusions, illusions, hallucinations

## 2014-12-22 LAB — HM DIABETES EYE EXAM

## 2015-01-14 ENCOUNTER — Encounter: Payer: Self-pay | Admitting: Family Medicine

## 2015-02-28 ENCOUNTER — Ambulatory Visit (INDEPENDENT_AMBULATORY_CARE_PROVIDER_SITE_OTHER): Payer: BC Managed Care – PPO | Admitting: Family Medicine

## 2015-02-28 ENCOUNTER — Encounter: Payer: Self-pay | Admitting: Family Medicine

## 2015-02-28 VITALS — BP 142/92 | HR 108 | Temp 98.3°F | Wt 333.5 lb

## 2015-02-28 DIAGNOSIS — N912 Amenorrhea, unspecified: Secondary | ICD-10-CM

## 2015-02-28 DIAGNOSIS — J09X2 Influenza due to identified novel influenza A virus with other respiratory manifestations: Secondary | ICD-10-CM | POA: Diagnosis not present

## 2015-02-28 LAB — POCT URINE PREGNANCY: PREG TEST UR: NEGATIVE

## 2015-02-28 LAB — POCT INFLUENZA A/B
Influenza A, POC: POSITIVE — AB
Influenza B, POC: NEGATIVE

## 2015-02-28 MED ORDER — OSELTAMIVIR PHOSPHATE 75 MG PO CAPS: 75.0000 mg | ORAL_CAPSULE | Freq: Two times a day (BID) | ORAL | Status: DC

## 2015-02-28 NOTE — Patient Instructions (Signed)
iInfluenza, Adult Influenza ("the flu") is a viral infection of the respiratory tract. It occurs more often in winter months because people spend more time in close contact with one another. Influenza can make you feel very sick. Influenza easily spreads from person to person (contagious). CAUSES  Influenza is caused by a virus that infects the respiratory tract. You can catch the virus by breathing in droplets from an infected person's cough or sneeze. You can also catch the virus by touching something that was recently contaminated with the virus and then touching your mouth, nose, or eyes. RISKS AND COMPLICATIONS You may be at risk for a more severe case of influenza if you smoke cigarettes, have diabetes, have chronic heart disease (such as heart failure) or lung disease (such as asthma), or if you have a weakened immune system. Elderly people and pregnant women are also at risk for more serious infections. The most common problem of influenza is a lung infection (pneumonia). Sometimes, this problem can require emergency medical care and may be life threatening. SIGNS AND SYMPTOMS  Symptoms typically last 4 to 10 days and may include:  Fever.  Chills.  Headache, body aches, and muscle aches.  Sore throat.  Chest discomfort and cough.  Poor appetite.  Weakness or feeling tired.  Dizziness.  Nausea or vomiting. DIAGNOSIS  Diagnosis of influenza is often made based on your history and a physical exam. A nose or throat swab test can be done to confirm the diagnosis. TREATMENT  In mild cases, influenza goes away on its own. Treatment is directed at relieving symptoms. For more severe cases, your health care provider may prescribe antiviral medicines to shorten the sickness. Antibiotic medicines are not effective because the infection is caused by a virus, not by bacteria. HOME CARE INSTRUCTIONS  Take medicines only as directed by your health care provider.  Use a cool mist humidifier  to make breathing easier.  Get plenty of rest until your temperature returns to normal. This usually takes 3 to 4 days.  Drink enough fluid to keep your urine clear or pale yellow.  Cover yourmouth and nosewhen coughing or sneezing,and wash your handswellto prevent thevirusfrom spreading.  Stay homefromwork orschool untilthe fever is gonefor at least 65full day. PREVENTION  An annual influenza vaccination (flu shot) is the best way to avoid getting influenza. An annual flu shot is now routinely recommended for all adults in the [place] SEEK MEDICAL CARE IF:  You experiencechest pain, yourcough worsens,or you producemore mucus.  Youhave nausea,vomiting, ordiarrhea.  Your fever returns or gets worse. SEEK IMMEDIATE MEDICAL CARE IF:  You havetrouble breathing, you become short of breath,or your skin ornails becomebluish.  You have severe painor stiffnessin the neck.  You develop a sudden headache, or pain in the face or ear.  You have nausea or vomiting that you cannot control. MAKE SURE YOU:   Understand these instructions.  Will watch your condition.  Will get help right away if you are not doing well or get worse.   This information is not intended to replace advice given to you by your health care provider. Make sure you discuss any questions you have with your health care provider.   Document Released: 12/16/1999 Document Revised: 01/08/2014 Document Reviewed: 03/19/2011 Elsevier Interactive Patient Education [employer]

## 2015-02-28 NOTE — Progress Notes (Signed)
Subjective:   Patient ID: Kristy Conrad, female    DOB: 08-14-1975, 40 y.o.   MRN: [id number]  Kristy Conrad is a pleasant 40 y.o. year old female who presents to clinic today with Dizziness and Generalized Body Aches  on 02/28/2015  HPI:  Acute onset of body aches, subjective fever, runny nose, cough and dizziness yesterday. "I feel awful." She did receive her influenza vaccine this year.  Works in [employer] so multiple sick contacts.  No n/v/d.  No CP or SOB.  Current Outpatient Prescriptions on File Prior to Visit  Medication Sig Dispense Refill  . benazepril-hydrochlorthiazide (LOTENSIN HCT) 5-6.25 MG per tablet Take 1 tablet by mouth daily. 30 tablet 1  . buPROPion (WELLBUTRIN SR) 150 MG 12 hr tablet TAKE 1 TAB BY MOUTH EVERY MORNING. 30 tablet 4  . Ferrous Sulfate (IRON) 28 MG TABS Take 1 tablet by mouth daily.    . folic acid (FOLVITE) 1 MG tablet Take 1 mg by mouth daily.    . metFORMIN (GLUCOPHAGE) 1000 MG tablet TAKE 1 TABLET  BY MOUTH 2 (TWO) TIMES DAILY WITH A MEAL. 60 tablet 2  . phentermine 37.5 MG capsule Take 1 capsule (37.5 mg total) by mouth every morning. 30 capsule 2  . tranexamic acid (LYSTEDA) 650 MG TABS tablet Take 1,300 mg by mouth 3 (three) times daily. While on menstrual cycle     No current facility-administered medications on file prior to visit.    Allergies  Allergen Reactions  . Morphine     REACTION: vomiting    Past Medical History  Diagnosis Date  . Depression   . Diabetes mellitus   . PCOS (polycystic ovarian syndrome)   . Obesity     Past Surgical History  Procedure Laterality Date  . Cholecystectomy    . Breast surgery      breast reduction    Family History  Problem Relation Age of Onset  . Heart attack Father 59  . Cancer Mother     adenoid cystic carcinoma  . Emphysema Paternal Grandfather   . Emphysema Maternal Grandfather   . Heart disease Maternal Grandmother   . Cancer Maternal Grandfather   . Cancer  Paternal Grandfather     Social History   Social History  . Marital Status: Single    Spouse Name: N/A  . Number of Children: N/A  . Years of Education: N/A   Occupational History  . Teacher    Social History Main Topics  . Smoking status: Never Smoker   . Smokeless tobacco: Never Used  . Alcohol Use: Yes     Comment: 4-5 drinks per week  . Drug Use: No  . Sexual Activity:    Partners: Male   Other Topics Concern  . Not on file   Social History Narrative   Regular exercise: no   Caffeine use: daily, tea   The PMH, PSH, Social History, Family History, Medications, and allergies have been reviewed in [facility], and have been updated if relevant.   Review of Systems  Constitutional: Positive for fever.  HENT: Positive for congestion. Negative for sinus pressure, sneezing, sore throat, trouble swallowing and voice change.   Respiratory: Negative.   Cardiovascular: Negative.   Genitourinary: Negative.   Musculoskeletal: Positive for myalgias.  Skin: Negative.   Neurological: Positive for dizziness.  Hematological: Negative.   All other systems reviewed and are negative.      Objective:    BP 142/92 mmHg  Pulse 108  Temp(Src) 98.3 F (36.8 C) (Oral)  Wt 333 lb 8 oz (151.275 kg)  SpO2 98%  LMP 01/26/2015   Physical Exam  Constitutional: She is oriented to person, place, and time. She appears well-developed and well-nourished. No distress.  HENT:  Head: Normocephalic.  Nose: Rhinorrhea present.  Eyes: Conjunctivae are normal.  Neck: Normal range of motion.  Cardiovascular: Normal rate.   Pulmonary/Chest: Effort normal.  Musculoskeletal: Normal range of motion.  Neurological: She is alert and oriented to person, place, and time. No cranial nerve deficit.  Skin: Skin is warm and dry.  Psychiatric: She has a normal mood and affect. Her behavior is normal. Thought content normal.  Nursing note and vitals reviewed.         Assessment & Plan:   Influenza  due to identified novel influenza A virus with other respiratory manifestations No Follow-up on file.

## 2015-02-28 NOTE — Addendum Note (Signed)
Addended by: Desmond Dike on: 02/28/2015 10:46 AM   Modules accepted: Orders

## 2015-02-28 NOTE — Progress Notes (Signed)
Pre visit review using our clinic review tool, if applicable. No additional management support is needed unless otherwise documented below in the visit note. 

## 2015-02-28 NOTE — Assessment & Plan Note (Signed)
New- Influenza A positive. Treat with tamiflu 75 mg twice daily x 5 days. Supportive care as per AVS.

## 2015-03-01 ENCOUNTER — Telehealth: Payer: Self-pay

## 2015-03-01 NOTE — Telephone Encounter (Signed)
Pt left v/m; pt was seen 02/28/15 and pt wants to know what can take along with tamiflu for muscle achiness. Pt request cb. [employer].

## 2015-03-01 NOTE — Telephone Encounter (Signed)
There's no real concrete answer for this.  Tamiflu helps her to get over the virus quicker but the virus is still present and muscle aches come with it.  She can take Tylenol and or Ibuprofen for muscle aches.  I hope she feels better soon. Keep [place] updated.

## 2015-03-01 NOTE — Telephone Encounter (Signed)
Lm on pts vm and advised per DrAron. Pt advised to contact office back should she have additional questions 

## 2015-03-06 ENCOUNTER — Other Ambulatory Visit: Payer: Self-pay | Admitting: Family Medicine

## 2015-04-12 ENCOUNTER — Encounter: Payer: Self-pay | Admitting: Family Medicine

## 2015-04-12 ENCOUNTER — Ambulatory Visit (INDEPENDENT_AMBULATORY_CARE_PROVIDER_SITE_OTHER): Payer: BC Managed Care – PPO | Admitting: Family Medicine

## 2015-04-12 VITALS — BP 124/80 | HR 100 | Temp 98.4°F | Wt 327.8 lb

## 2015-04-12 DIAGNOSIS — F331 Major depressive disorder, recurrent, moderate: Secondary | ICD-10-CM | POA: Diagnosis not present

## 2015-04-12 DIAGNOSIS — G4733 Obstructive sleep apnea (adult) (pediatric): Secondary | ICD-10-CM | POA: Diagnosis not present

## 2015-04-12 DIAGNOSIS — E1121 Type 2 diabetes mellitus with diabetic nephropathy: Secondary | ICD-10-CM

## 2015-04-12 LAB — COMPREHENSIVE METABOLIC PANEL
ALT: 20 U/L (ref 0–35)
AST: 20 U/L (ref 0–37)
Albumin: 3.6 g/dL (ref 3.5–5.2)
Alkaline Phosphatase: 60 U/L (ref 39–117)
BUN: 12 mg/dL (ref 6–23)
CHLORIDE: 102 meq/L (ref 96–112)
CO2: 28 meq/L (ref 19–32)
CREATININE: 0.71 mg/dL (ref 0.40–1.20)
Calcium: 9.7 mg/dL (ref 8.4–10.5)
GFR: 96.37 mL/min (ref >60.00)
Glucose, Bld: 172 mg/dL — ABNORMAL HIGH (ref 70–99)
Potassium: 4.7 mEq/L (ref 3.5–5.1)
SODIUM: 137 meq/L (ref 135–145)
Total Bilirubin: 0.3 mg/dL (ref 0.2–1.2)
Total Protein: 8 g/dL (ref 6.0–8.3)

## 2015-04-12 LAB — LIPID PANEL
Cholesterol: 113 mg/dL (ref 0–200)
HDL: 38.7 mg/dL — ABNORMAL LOW (ref >39.00)
LDL CALC: 53 mg/dL (ref 0–99)
NonHDL: 74.58
Total CHOL/HDL Ratio: 3
Triglycerides: 108 mg/dL (ref 0.0–149.0)
VLDL: 21.6 mg/dL (ref 0.0–40.0)

## 2015-04-12 LAB — HEMOGLOBIN A1C: Hgb A1c MFr Bld: 7 % — ABNORMAL HIGH (ref 4.6–6.5)

## 2015-04-12 MED ORDER — PHENTERMINE HCL 37.5 MG PO CAPS: 37.5000 mg | ORAL_CAPSULE | ORAL | Status: DC

## 2015-04-12 NOTE — Assessment & Plan Note (Signed)
Improving Phentermine rx refilled today. Follow up in 6 months.

## 2015-04-12 NOTE — Progress Notes (Signed)
Pre visit review using our clinic review tool, if applicable. No additional management support is needed unless otherwise documented below in the visit note. 

## 2015-04-12 NOTE — Assessment & Plan Note (Signed)
Check a1c. As she loses weight, I would like to decrease her dose of Metformin. The patient indicates understanding of these issues and agrees with the plan.

## 2015-04-12 NOTE — Progress Notes (Signed)
40  yo female with h/o morbid obesity, DM, and depression, here for follow up.   DM- not checking FSBS anymore. . No recent hypoglycemia. Lab Results  Component Value Date   HGBA1C 5.8 08/09/2014    On Metformin 1000 mg bid.   Urine micro positive in 01/2013 and then negative on 03/31/14. Pneumovax 10/20/09  Lab Results  Component Value Date   CHOL 109 08/09/2014   HDL 34.00* 08/09/2014   LDLCALC 47 08/09/2014   TRIG 138.0 08/09/2014   CHOLHDL 3 08/09/2014     OSA- She still has not used her CPAP.  Says it makes her feel trapped.     Obesity- .  Trying to increase physical activity- parking farther away at work.  Restarted phentermine after the miscarriage.   Wt Readings from Last 3 Encounters:  04/12/15 327 lb 12 oz (148.666 kg)  02/28/15 333 lb 8 oz (151.275 kg)  11/24/14 323 lb (146.512 kg)     Patient Active Problem List   Diagnosis Date Noted  . Edema 01/19/2013  . OSA (obstructive sleep apnea) 09/15/2012  . Obesity, morbid, BMI 50 or higher (HCC) 11/12/2011  . 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 POLYCYSTIC OVARIES 07/08/2009  . DEPRESSION, MAJOR, RECURRENT, MODERATE 07/08/2009   Past Medical History  Diagnosis Date  . Depression   . Diabetes mellitus   . PCOS (polycystic ovarian syndrome)   . Obesity    Past Surgical History  Procedure Laterality Date  . Cholecystectomy    . Breast surgery      breast reduction   Social History  Substance Use Topics  . Smoking status: Never Smoker   . Smokeless tobacco: Never Used  . Alcohol Use: Yes     Comment: 4-5 drinks per week   Family History  Problem Relation Age of Onset  . Heart attack Father 1548  . Cancer Mother     adenoid cystic carcinoma  . Emphysema Paternal Grandfather   . Emphysema Maternal Grandfather   . Heart disease Maternal Grandmother   . Cancer Maternal Grandfather   . Cancer Paternal Grandfather    Allergies  Allergen Reactions  . Morphine     REACTION:  vomiting   Current Outpatient Prescriptions on File Prior to Visit  Medication Sig Dispense Refill  . benazepril-hydrochlorthiazide (LOTENSIN HCT) 5-6.25 MG per tablet Take 1 tablet by mouth daily. 30 tablet 1  . buPROPion (WELLBUTRIN SR) 150 MG 12 hr tablet TAKE 1 TAB BY MOUTH EVERY MORNING. 30 tablet 4  . Ferrous Sulfate (IRON) 28 MG TABS Take 1 tablet by mouth daily.    . folic acid (FOLVITE) 1 MG tablet Take 1 mg by mouth daily.    . metFORMIN (GLUCOPHAGE) 1000 MG tablet TAKE 1 TABLET  BY MOUTH 2 (TWO) TIMES DAILY WITH A MEAL. 60 tablet 1  . phentermine 37.5 MG capsule Take 1 capsule (37.5 mg total) by mouth every morning. 30 capsule 2  . tranexamic acid (LYSTEDA) 650 MG TABS tablet Take 1,300 mg by mouth 3 (three) times daily. While on menstrual cycle     No current facility-administered medications on file prior to visit.      The PMH, PSH, Social History, Family History, Medications, and allergies [facility], and have been updated if relevant.   Review of Systems    Review of Systems  Constitutional: Negative.   HENT: Negative.   Eyes: Negative.   Respiratory: Negative.   Cardiovascular: Negative.  Gastrointestinal: Negative.   Endocrine: Negative.   Musculoskeletal: Negative.   Skin: Negative.   Allergic/Immunologic: Negative.   Neurological: Negative.   Hematological: Negative.   Psychiatric/Behavioral: Negative.      Physical Exam BP 124/80 mmHg  Pulse 100  Temp(Src) 98.4 F (36.9 C) (Oral)  Wt 327 lb 12 oz (148.666 kg)  SpO2 98%  LMP 04/03/2015  General:  morbidly obese, NAD Mouth:  good dentition.   Neck:  No deformities, masses, or tenderness noted. Lungs:  Normal respiratory effort, chest expands symmetrically. Lungs are clear to auscultation, no crackles or wheezes. Heart:  Normal rate and regular rhythm. S1 and S2 normal without gallop, murmur, click, rub or other extra sounds. Neurologic:  No cranial nerve deficits noted. Station  and gait are normal. Plantar reflexes are down-going bilaterally. DTRs are symmetrical throughout. Sensory, motor and coordinative functions appear intact. Ext:  No edema Psych:   Good eye contact, not tearful today. Does appear anxious Cognition and judgment appear intact. Alert and cooperative with normal attention span and concentration. No apparent delusions, illusions, hallucinations

## 2015-04-12 NOTE — Assessment & Plan Note (Signed)
Well controlled. No changes made to rxs today. 

## 2015-04-29 ENCOUNTER — Telehealth: Payer: Self-pay | Admitting: Family Medicine

## 2015-04-29 NOTE — Telephone Encounter (Signed)
Spoke to pt who states that her Lennette BihariShOB has been ongoing for years but she has never mentioned it to Dr Dayton MartesAron. She states that she has noticed it occurs more frequently at work, and is wondering if it is stress induced or anxiety. Advised pt that since s/s are more frequent. Pt has agreed to go to the urgent care, and f/u appts have been scheduled with Dr Dayton MartesAron.

## 2015-04-29 NOTE — Telephone Encounter (Signed)
Please call to check on pt.  She does need to be evaluated.

## 2015-04-29 NOTE — Telephone Encounter (Signed)
Noted! Thank you

## 2015-04-29 NOTE — Telephone Encounter (Signed)
See additional phone note from team health

## 2015-04-29 NOTE — Telephone Encounter (Signed)
°  Patient Name: Kristy Conrad  DOB: 05/31/1975    Initial Comment Caller States having trouble catching her breath, been going on/off for the week.    Nurse Assessment  Nurse: Deatra JamesNoe, RN, Corrie DandyMary Date/Time (Eastern Time): 04/29/2015 8:50:09 AM  Confirm and document reason for call. If symptomatic, describe symptoms. You must click the next button to save text entered. ---Patient states having trouble catching her breath, been going on/off for the week.  Has the patient traveled out of the country within the last 30 days? ---No  Does the patient have any new or worsening symptoms? ---Yes  Will a triage be completed? ---Yes  Related visit to physician within the last 2 weeks? ---No  Does the PT have any chronic conditions? (i.e. diabetes, asthma, etc.) ---No  Is the patient pregnant or possibly pregnant? (Ask all females between the ages of 5812-55) ---No  Is this a behavioral health or substance abuse call? ---No     Guidelines    Guideline Title Affirmed Question Affirmed Notes  Breathing Difficulty [1] MODERATE difficulty breathing (e.g., speaks in phrases, SOB even at rest, pulse 100-120) AND [2] NEW-onset or WORSE than normal    Final Disposition User   Go to ED Now Deatra JamesNoe, RN, [facility]    Referrals  GO TO FACILITY REFUSED   Disagree/Comply: Disagree  Disagree/Comply Reason: Disagree with instructions

## 2015-04-29 NOTE — Telephone Encounter (Signed)
Spoke to pt who states [facility] [address] She reports receiving a breathing Rx with pulse and O2 levels normal. She was given an EKG and Rx for Xopenex and Albuterol and was advised to fill whichever is cheapest. Pt advised to f/u with PCP on Monday and appt previously scheduled. Records requested

## 2015-04-29 NOTE — Telephone Encounter (Signed)
FYICorrie Dandy:  [facility] to let Dr. Dayton MartesAron know that pt is having trouble catching her breath that comes and goes. She is DOB at rest. Pt is at work and refused to go to ER.

## 2015-05-02 ENCOUNTER — Encounter: Payer: Self-pay | Admitting: Family Medicine

## 2015-05-02 ENCOUNTER — Ambulatory Visit (INDEPENDENT_AMBULATORY_CARE_PROVIDER_SITE_OTHER): Payer: BC Managed Care – PPO | Admitting: Family Medicine

## 2015-05-02 VITALS — BP 142/94 | HR 105 | Temp 98.3°F | Wt 334.4 lb

## 2015-05-02 DIAGNOSIS — F4322 Adjustment disorder with anxiety: Secondary | ICD-10-CM | POA: Insufficient documentation

## 2015-05-02 DIAGNOSIS — R0602 Shortness of breath: Secondary | ICD-10-CM | POA: Insufficient documentation

## 2015-05-02 MED ORDER — ALPRAZOLAM 0.25 MG PO TABS: 0.2500 mg | ORAL_TABLET | Freq: Two times a day (BID) | ORAL | Status: DC | PRN

## 2015-05-02 NOTE — Assessment & Plan Note (Signed)
New- likely multifactorial but given benign exam and history, agree anxiety likely playing a larger role here. Given rx for xanax to use prn SOB (no more than twice per day) and discussed sedation and addiction potential. Call or return to clinic prn if these symptoms worsen or fail to improve as anticipated. The patient indicates understanding of these issues and agrees with the plan.

## 2015-05-02 NOTE — Progress Notes (Signed)
Subjective:   Patient ID: Kristy Conrad, female    DOB: 11/09/1975, 40 y.o.   MRN: [id number]  Kristy Conrad is a pleasant 40 y.o. year old female who presents to clinic today with Follow-up  on 05/02/2015  HPI:  [facility]. Notes reviewed on Care Everywhere.  At that time, complained of 1 week of progressive yet constant SOB without fever, CP, vomiting, cough or wheezing.  She felt perhaps it was anxiety related. Exam unremarkable except for tachycardia- HR in 120s. EKG unremarkable.  Given nebs treatment with xopenex and reported that symptoms improved so d/c'd home with rx for albuterol and xopenex inhalers to use prn SOB,( diagnosis of RAD vs anxiety) and advised to f/u with me today. She picked up xopenex rx only.  Has been using the xopenex several times a day since she was seen.'  She feels the xopenex has helped some but only feels SOB at work or when she thinks of work.  Walked several miles over the weekend and experienced no SOB.  At work this morning, has had two episodes.  Current Outpatient Prescriptions on File Prior to Visit  Medication Sig Dispense Refill  . benazepril-hydrochlorthiazide (LOTENSIN HCT) 5-6.25 MG per tablet Take 1 tablet by mouth daily. 30 tablet 1  . buPROPion (WELLBUTRIN SR) 150 MG 12 hr tablet TAKE 1 TAB BY MOUTH EVERY MORNING. 30 tablet 4  . Ferrous Sulfate (IRON) 28 MG TABS Take 1 tablet by mouth daily.    . folic acid (FOLVITE) 1 MG tablet Take 1 mg by mouth daily.    . metFORMIN (GLUCOPHAGE) 1000 MG tablet TAKE 1 TABLET  BY MOUTH 2 (TWO) TIMES DAILY WITH A MEAL. 60 tablet 1  . phentermine 37.5 MG capsule Take 1 capsule (37.5 mg total) by mouth every morning. 30 capsule 2  . tranexamic acid (LYSTEDA) 650 MG TABS tablet Take 1,300 mg by mouth 3 (three) times daily. While on menstrual cycle     No current facility-administered medications on file prior to visit.    Allergies  Allergen Reactions  . Morphine    REACTION: vomiting    Past Medical History  Diagnosis Date  . Depression   . Diabetes mellitus   . PCOS (polycystic ovarian syndrome)   . Obesity     Past Surgical History  Procedure Laterality Date  . Cholecystectomy    . Breast surgery      breast reduction    Family History  Problem Relation Age of Onset  . Heart attack Father [id number]  . Cancer Mother     adenoid cystic carcinoma  . Emphysema Paternal Grandfather   . Emphysema Maternal Grandfather   . Heart disease Maternal Grandmother   . Cancer Maternal Grandfather   . Cancer Paternal Grandfather     Social History   Social History  . Marital Status: Single    Spouse Name: N/A  . Number of Children: N/A  . Years of Education: N/A   Occupational History  . Teacher    Social History Main Topics  . Smoking status: Never Smoker   . Smokeless tobacco: Never Used  . Alcohol Use: Yes     Comment: 4-5 drinks per week  . Drug Use: No  . Sexual Activity:    Partners: Male   Other Topics Concern  . Not on file   Social History Narrative   Regular exercise: no   Caffeine use: daily, tea  The PMH, PSH, Social History, Family History, Medications, and allergies have been reviewed in [facility], and have been updated if relevant.    Review of Systems  Constitutional: Negative.   HENT: Negative.   Eyes: Negative.   Respiratory: Positive for shortness of breath. Negative for wheezing and stridor.   Cardiovascular: Negative.   Neurological: Negative.   Psychiatric/Behavioral: Positive for sleep disturbance. Negative for suicidal ideas, hallucinations, behavioral problems, confusion, self-injury, dysphoric mood, decreased concentration and agitation. The patient is nervous/anxious. The patient is not hyperactive.   All other systems reviewed and are negative.      Objective:    BP 142/94 mmHg  Pulse 105  Temp(Src) 98.3 F (36.8 C)  Wt 334 lb 6.4 oz (151.683 kg)  SpO2 99%  LMP 04/03/2015   Physical Exam    Constitutional: She is oriented to person, place, and time. She appears well-developed and well-nourished. No distress.  HENT:  Head: Normocephalic.  Eyes: Conjunctivae are normal.  Cardiovascular: Regular rhythm.   Pulmonary/Chest: Effort normal and breath sounds normal. No respiratory distress. She has no wheezes.  Neurological: She is alert and oriented to person, place, and time. No cranial nerve deficit.  Skin: Skin is warm and dry. She is not diaphoretic.  Psychiatric: She has a normal mood and affect. Her behavior is normal. Judgment and thought content normal.  Nursing note and vitals reviewed.         Assessment & Plan:   Shortness of breath No Follow-up on file.

## 2015-05-02 NOTE — Patient Instructions (Signed)
Great to see you. Please contact your HR department.  Xanax as needed for a panic attack.  Please keep me updated.

## 2015-05-02 NOTE — Progress Notes (Signed)
Pre visit review using our clinic review tool, if applicable. No additional management support is needed unless otherwise documented below in the visit note. 

## 2015-05-03 ENCOUNTER — Encounter: Payer: Self-pay | Admitting: Family Medicine

## 2015-05-04 ENCOUNTER — Ambulatory Visit: Payer: BC Managed Care – PPO | Admitting: Family Medicine

## 2015-05-04 ENCOUNTER — Encounter: Payer: Self-pay | Admitting: Family Medicine

## 2015-05-09 NOTE — Telephone Encounter (Signed)
Per Dr Dayton MartesAron, "Ok to write letter as requested below. Please let pt know."

## 2015-05-18 ENCOUNTER — Ambulatory Visit: Payer: Self-pay | Admitting: Family Medicine

## 2015-05-18 ENCOUNTER — Ambulatory Visit (INDEPENDENT_AMBULATORY_CARE_PROVIDER_SITE_OTHER): Payer: BC Managed Care – PPO | Admitting: Family Medicine

## 2015-05-18 ENCOUNTER — Encounter: Payer: Self-pay | Admitting: Family Medicine

## 2015-05-18 VITALS — BP 122/90 | HR 84 | Temp 98.6°F | Wt 342.0 lb

## 2015-05-18 DIAGNOSIS — F4322 Adjustment disorder with anxiety: Secondary | ICD-10-CM

## 2015-05-18 MED ORDER — ALPRAZOLAM 0.25 MG PO TABS: 0.2500 mg | ORAL_TABLET | Freq: Two times a day (BID) | ORAL | Status: DC | PRN

## 2015-05-18 NOTE — Progress Notes (Signed)
Pre visit review using our clinic review tool, if applicable. No additional management support is needed unless otherwise documented below in the visit note. 

## 2015-05-18 NOTE — Progress Notes (Signed)
Subjective:   Patient ID: Kristy BonineAlyce O Conrad, female    DOB: 09/12/1975, 40 y.o.   MRN: [id number]  Kristy Boninelyce O Antonetti is a pleasant 40 y.o. year old female who presents to clinic today with Follow-up  on 05/18/2015  HPI:  Saw her on 5/1 for Urgent care/ER follow up.  Was seen for SOB which was felt to be anxiety related. Had been under significant stressors at work.  Given rx for xanax to use prn.  She is feeling better.  Using xanax sometimes once a day.  School is coming to an end and she expects her anxiety will improve at that point as well.  Current Outpatient Prescriptions on File Prior to Visit  Medication Sig Dispense Refill  . ALPRAZolam (XANAX) 0.25 MG tablet Take 1 tablet (0.25 mg total) by mouth 2 (two) times daily as needed for anxiety. (Patient taking differently: Take 0.25 mg by mouth 2 (two) times daily as needed for anxiety (Pt has only been taking once a day). ) 20 tablet 0  . benazepril-hydrochlorthiazide (LOTENSIN HCT) 5-6.25 MG per tablet Take 1 tablet by mouth daily. 30 tablet 1  . buPROPion (WELLBUTRIN SR) 150 MG 12 hr tablet TAKE 1 TAB BY MOUTH EVERY MORNING. 30 tablet 4  . Ferrous Sulfate (IRON) 28 MG TABS Take 1 tablet by mouth daily.    . folic acid (FOLVITE) 1 MG tablet Take 1 mg by mouth daily.    . metFORMIN (GLUCOPHAGE) 1000 MG tablet TAKE 1 TABLET  BY MOUTH 2 (TWO) TIMES DAILY WITH A MEAL. 60 tablet 1  . phentermine 37.5 MG capsule Take 1 capsule (37.5 mg total) by mouth every morning. 30 capsule 2  . tranexamic acid (LYSTEDA) 650 MG TABS tablet Take 1,300 mg by mouth 3 (three) times daily. While on menstrual cycle     No current facility-administered medications on file prior to visit.    Allergies  Allergen Reactions  . Morphine     REACTION: vomiting    Past Medical History  Diagnosis Date  . Depression   . Diabetes mellitus   . PCOS (polycystic ovarian syndrome)   . Obesity     Past Surgical History  Procedure Laterality Date  .  Cholecystectomy    . Breast surgery      breast reduction    Family History  Problem Relation Age of Onset  . Heart attack Father [id number]  . Cancer Mother     adenoid cystic carcinoma  . Emphysema Paternal Grandfather   . Emphysema Maternal Grandfather   . Heart disease Maternal Grandmother   . Cancer Maternal Grandfather   . Cancer Paternal Grandfather     Social History   Social History  . Marital Status: Single    Spouse Name: N/A  . Number of Children: N/A  . Years of Education: N/A   Occupational History  . Teacher    Social History Main Topics  . Smoking status: Never Smoker   . Smokeless tobacco: Never Used  . Alcohol Use: Yes     Comment: 4-5 drinks per week  . Drug Use: No  . Sexual Activity:    Partners: Male   Other Topics Concern  . Not on file   Social History Narrative   Regular exercise: no   Caffeine use: daily, tea   The PMH, PSH, Social History, Family History, Medications, and allergies [facility], and have been updated if relevant.   Review of Systems  Psychiatric/Behavioral: Negative.  Negative for suicidal ideas, hallucinations, behavioral problems, confusion, sleep disturbance, self-injury, dysphoric mood, decreased concentration and agitation. The patient is not nervous/anxious and is not hyperactive.        Objective:    BP 122/90 mmHg  Pulse 84  Temp(Src) 98.6 F (37 C) (Oral)  Wt 342 lb (155.13 kg)  SpO2 99%   Physical Exam   General:  Well-developed,well-nourished,in no acute distress; alert,appropriate and cooperative throughout examination Head:  normocephalic and atraumatic.   Eyes:  vision grossly intact, pupils equal, pupils round, and pupils reactive to light.   Ears:  R ear normal and L ear normal.   Nose:  no external deformity.   Mouth:  good dentition.   Neck:  No deformities, masses, or tenderness noted. Lungs:  Normal respiratory effort, chest expands symmetrically. Lungs are clear to  auscultation, no crackles or wheezes. Heart:  Normal rate and regular rhythm. S1 and S2 normal without gallop, murmur, click, rub or other extra sounds. Abdomen:  Bowel sounds positive,abdomen soft and non-tender without masses, organomegaly or hernias noted. Msk:  No deformity or scoliosis noted of thoracic or lumbar spine.   Extremities:  No clubbing, cyanosis, edema, or deformity noted with normal full range of motion of all joints.   Neurologic:  alert & oriented X3 and gait normal.   Skin:  Intact without suspicious lesions or rashes Cervical Nodes:  No lymphadenopathy noted Axillary Nodes:  No palpable lymphadenopathy Psych:  Cognition and judgment appear intact. Alert and cooperative with normal attention span and concentration. No apparent delusions, illusions, hallucinations        Assessment & Plan:   Transient adjustment reaction with anxiety No Follow-up on file.

## 2015-05-18 NOTE — Assessment & Plan Note (Signed)
>  15 minutes spent in face to face time with patient, >50% spent in counselling or coordination of care Symptoms improving. Has meeting today with her supervisor and she seems hopeful that her situation at work will be improving. Xanax rx refilled and given to pt. Call or return to clinic prn if these symptoms worsen or fail to improve as anticipated. The patient indicates understanding of these issues and agrees with the plan.

## 2015-05-23 ENCOUNTER — Other Ambulatory Visit: Payer: Self-pay | Admitting: Family Medicine

## 2015-06-03 ENCOUNTER — Other Ambulatory Visit: Payer: Self-pay | Admitting: Family Medicine

## 2015-06-16 ENCOUNTER — Encounter: Payer: Self-pay | Admitting: Family Medicine

## 2015-06-16 ENCOUNTER — Ambulatory Visit (INDEPENDENT_AMBULATORY_CARE_PROVIDER_SITE_OTHER): Payer: BC Managed Care – PPO | Admitting: Family Medicine

## 2015-06-16 VITALS — BP 139/90 | HR 107 | Temp 99.1°F | Ht 65.0 in | Wt 336.8 lb

## 2015-06-16 DIAGNOSIS — S90852A Superficial foreign body, left foot, initial encounter: Secondary | ICD-10-CM | POA: Diagnosis not present

## 2015-06-16 DIAGNOSIS — E1121 Type 2 diabetes mellitus with diabetic nephropathy: Secondary | ICD-10-CM | POA: Diagnosis not present

## 2015-06-16 NOTE — Patient Instructions (Signed)
Wash area with warm soapy water daily. Keep covered with antibiotic ointment and bandaid.  Stay off feet as able.  Call if redness spreading on heel or new discharge form wound.

## 2015-06-16 NOTE — Progress Notes (Signed)
   Subjective:    Patient ID: Kristy Conrad, female    DOB: 10/21/1975, 40 y.o.   MRN: [id number]  HPI  40 year old female pt of Dr. Elmer SowAron's with history of DM, morbid obesity presents following injury to her foot with glass.  She reports 1 week ago.. She dropped a pitcher of tea, glass. Went to get soes to clean it up, but then felt sharp pain in left heel. Has small mark on heel, possible cut.  No bleeding.  Pain is improved with wearing shoes, but worse pain barefoot.  She feels raised area on heel.  No redness, no pus.  No fever, no N/V.  Lab Results  Component Value Date   HGBA1C 7.0* 04/12/2015      Review of Systems  Constitutional: Negative for fever and fatigue.  HENT: Negative for ear pain.   Eyes: Negative for pain.  Respiratory: Negative for chest tightness and shortness of breath.   Cardiovascular: Negative for chest pain, palpitations and leg swelling.  Gastrointestinal: Negative for abdominal pain.  Genitourinary: Negative for dysuria.       Objective:   Physical Exam  Constitutional: Vital signs are normal. She appears well-developed and well-nourished. She is cooperative.  Non-toxic appearance. She does not appear ill. No distress.  Morbidly obese female  HENT:  Head: Normocephalic.  Right Ear: Hearing, tympanic membrane, external ear and ear canal normal. Tympanic membrane is not erythematous, not retracted and not bulging.  Left Ear: Hearing, tympanic membrane, external ear and ear canal normal. Tympanic membrane is not erythematous, not retracted and not bulging.  Nose: No mucosal edema or rhinorrhea. Right sinus exhibits no maxillary sinus tenderness and no frontal sinus tenderness. Left sinus exhibits no maxillary sinus tenderness and no frontal sinus tenderness.  Mouth/Throat: Uvula is midline, oropharynx is clear and moist and mucous membranes are normal.  Eyes: Conjunctivae, EOM and lids are normal. Pupils are equal, round, and reactive to light.  Lids are everted and swept, no foreign bodies found.  Neck: Trachea normal and normal range of motion. Neck supple. Carotid bruit is not present. No thyroid mass and no thyromegaly present.  Cardiovascular: Normal rate, regular rhythm, S1 normal, S2 normal, normal heart sounds, intact distal pulses and normal pulses.  Exam reveals no gallop and no friction rub.   No murmur heard. Pulmonary/Chest: Effort normal and breath sounds normal. No tachypnea. No respiratory distress. She has no decreased breath sounds. She has no wheezes. She has no rhonchi. She has no rales.  Abdominal: Normal appearance.  Neurological: She is alert.  Skin: Skin is warm, dry and intact. No rash noted.  Left heel with 2 poinpoint lacerations, area closer to heel occ tender to palpation... No redness, no discharge, no induration .  Area probed gentle with 11 blade.. No foreign body found. No bleeding  Psychiatric: Her speech is normal and behavior is normal. Judgment and thought content normal. Her mood appears not anxious. Cognition and memory are normal. She does not exhibit a depressed mood.          Assessment & Plan:

## 2015-06-16 NOTE — Progress Notes (Signed)
Pre visit review using our clinic review tool, if applicable. No additional management support is needed unless otherwise documented below in the visit note. 

## 2015-06-16 NOTE — Assessment & Plan Note (Signed)
No associated infection. Area probed gentle without FB being found.  Rec elevation, warm soaks, apply bandaid and antibiotic ointment.

## 2015-07-04 ENCOUNTER — Other Ambulatory Visit: Payer: Self-pay | Admitting: Family Medicine

## 2015-07-04 NOTE — Telephone Encounter (Signed)
Last Rx 01/2015 and last OV note states pt is taking Xanax. pls advise

## 2015-07-07 ENCOUNTER — Other Ambulatory Visit: Payer: Self-pay | Admitting: *Deleted

## 2015-07-07 MED ORDER — ALPRAZOLAM 0.25 MG PO TABS: 0.2500 mg | ORAL_TABLET | Freq: Two times a day (BID) | ORAL | Status: DC | PRN

## 2015-07-07 NOTE — Telephone Encounter (Signed)
Rx called in to requested pharmacy 

## 2015-07-07 NOTE — Telephone Encounter (Signed)
Last f/u 05/2015 

## 2015-07-21 ENCOUNTER — Ambulatory Visit: Payer: Self-pay | Admitting: Family Medicine

## 2015-08-01 ENCOUNTER — Ambulatory Visit (INDEPENDENT_AMBULATORY_CARE_PROVIDER_SITE_OTHER): Payer: BC Managed Care – PPO | Admitting: Family Medicine

## 2015-08-01 ENCOUNTER — Encounter: Payer: Self-pay | Admitting: Family Medicine

## 2015-08-01 VITALS — BP 118/66 | HR 111 | Temp 99.1°F | Wt 332.3 lb

## 2015-08-01 DIAGNOSIS — E1129 Type 2 diabetes mellitus with other diabetic kidney complication: Secondary | ICD-10-CM

## 2015-08-01 DIAGNOSIS — E282 Polycystic ovarian syndrome: Secondary | ICD-10-CM | POA: Diagnosis not present

## 2015-08-01 DIAGNOSIS — F331 Major depressive disorder, recurrent, moderate: Secondary | ICD-10-CM

## 2015-08-01 DIAGNOSIS — F4322 Adjustment disorder with anxiety: Secondary | ICD-10-CM

## 2015-08-01 DIAGNOSIS — G4733 Obstructive sleep apnea (adult) (pediatric): Secondary | ICD-10-CM

## 2015-08-01 DIAGNOSIS — E0822 Diabetes mellitus due to underlying condition with diabetic chronic kidney disease: Secondary | ICD-10-CM

## 2015-08-01 MED ORDER — PHENTERMINE HCL 37.5 MG PO CAPS: 37.5000 mg | ORAL_CAPSULE | ORAL | 2 refills | Status: DC

## 2015-08-01 NOTE — Progress Notes (Signed)
40  yo female with h/o morbid obesity, DM, and depression, here for follow up.  Obesity- feels she is doing well with phentermine. Started water aerobics at the [facility] three weeks ago. Has been having some right knee pain, improved with tumeric.  DM- not checking FSBS anymore. . No recent hypoglycemia. Lab Results  Component Value Date   HGBA1C 7.0 (H) 04/12/2015    On Metformin 1000 mg bid.   Urine micro positive in 01/2013 and then negative on 03/31/14. Pneumovax 10/20/09  Lab Results  Component Value Date   CHOL 113 04/12/2015   HDL 38.70 (L) 04/12/2015   LDLCALC 53 04/12/2015   TRIG 108.0 04/12/2015   CHOLHDL 3 04/12/2015    OSA- She still has not used her CPAP.  Says it makes her feel trapped.     Obesity-  Trying to increase physical activity- parking farther away at work.  Has been on phentermine.  Wt Readings from Last 3 Encounters:  08/01/15 (!) 332 lb 5 oz (150.7 kg)  06/16/15 (!) 336 lb 12 oz (152.7 kg)  05/18/15 (!) 342 lb (155.1 kg)   Doing well on Wellubtrin at current dose.  Denies feeling depressed or anxious.  Patient Active Problem List  Diagnosis  . Diabetes mellitus with renal complications (HCC)  . POLYCYSTIC OVARIES  . DEPRESSION, MAJOR, RECURRENT, MODERATE  . Obesity, morbid, BMI 50 or higher (HCC)  . OSA (obstructive sleep apnea)  . Edema  . Transient adjustment reaction with anxiety   Past Medical History:  Diagnosis Date  . Depression   . Diabetes mellitus   . Obesity   . PCOS (polycystic ovarian syndrome)    Past Surgical History:  Procedure Laterality Date  . BREAST SURGERY     breast reduction  . CHOLECYSTECTOMY     Social History  Substance Use Topics  . Smoking status: Never Smoker  . Smokeless tobacco: Never Used  . Alcohol use Yes     Comment: 4-5 drinks per week   Family History  Problem Relation Age of Onset  . Heart attack Father 20  . Cancer Mother     adenoid cystic carcinoma  . Emphysema Paternal Grandfather   .  Emphysema Maternal Grandfather   . Heart disease Maternal Grandmother   . Cancer Maternal Grandfather   . Cancer Paternal Grandfather    Allergies  Allergen Reactions  . Morphine     REACTION: vomiting   Current Outpatient Prescriptions on File Prior to Visit  Medication Sig Dispense Refill  . ALPRAZolam (XANAX) 0.25 MG tablet Take 1 tablet (0.25 mg total) by mouth 2 (two) times daily as needed for anxiety. 60 tablet 0  . buPROPion (WELLBUTRIN SR) 150 MG 12 hr tablet Take 1 tablet (150 mg total) by mouth daily. 30 tablet 3  . Ferrous Sulfate (IRON) 28 MG TABS Take 1 tablet by mouth daily.    . folic acid (FOLVITE) 1 MG tablet Take 1 mg by mouth daily.    Marland Kitchen lisinopril (PRINIVIL,ZESTRIL) 5 MG tablet TAKE 1 TABLET (5 MG TOTAL) BY MOUTH DAILY. 90 tablet 2  . metFORMIN (GLUCOPHAGE) 1000 MG tablet TAKE 1 TABLET  BY MOUTH 2 (TWO) TIMES DAILY WITH A MEAL. 60 tablet 2  . phentermine 37.5 MG capsule Take 1 capsule (37.5 mg total) by mouth every morning. 30 capsule 2  . tranexamic acid (LYSTEDA) 650 MG TABS tablet Take 1,300 mg by mouth 3 (three) times daily. While on menstrual cycle     No current  facility-administered medications on file prior to visit.       The PMH, PSH, Social History, Family History, Medications, and allergies have been reviewed in [facility], and have been updated if relevant.   Review of Systems    Review of Systems  Constitutional: Negative.   HENT: Negative.   Eyes: Negative.   Respiratory: Negative.   Cardiovascular: Negative.   Gastrointestinal: Negative.   Endocrine: Negative.   Musculoskeletal: Negative.   Skin: Negative.   Allergic/Immunologic: Negative.   Neurological: Negative.   Hematological: Negative.   Psychiatric/Behavioral: Negative.      Physical Exam BP 118/66   Pulse (!) 111   Temp 99.1 F (37.3 C) (Oral)   Wt (!) 332 lb 5 oz (150.7 kg)   SpO2 99%   BMI 55.30 kg/m    Wt Readings from Last 3 Encounters:  08/01/15 (!) 332 lb 5 oz  (150.7 kg)  06/16/15 (!) 336 lb 12 oz (152.7 kg)  05/18/15 (!) 342 lb (155.1 kg)     General:  morbidly obese, NAD Mouth:  good dentition.   Neck:  No deformities, masses, or tenderness noted. Lungs:  Normal respiratory effort, chest expands symmetrically. Lungs are clear to auscultation, no crackles or wheezes. Heart:  Normal rate and regular rhythm. S1 and S2 normal without gallop, murmur, click, rub or other extra sounds. Neurologic:  No cranial nerve deficits noted. Station and gait are normal. Plantar reflexes are down-going bilaterally. DTRs are symmetrical throughout. Sensory, motor and coordinative functions appear intact. Ext:  No edema Psych:   Good eye contact, not tearful today. Does appear anxious Cognition and judgment appear intact. Alert and cooperative with normal attention span and concentration. No apparent delusions, illusions, hallucinations

## 2015-08-01 NOTE — Assessment & Plan Note (Addendum)
Check a1c. Continue current dose of Metformin. On ACEI. LDL at goal. Normotensive.

## 2015-08-01 NOTE — Assessment & Plan Note (Signed)
Complaint with CPAP

## 2015-08-01 NOTE — Assessment & Plan Note (Signed)
  Doing well with water aerobics. Continue phentermine. She is aware of risks.

## 2015-08-01 NOTE — Progress Notes (Signed)
Pre visit review using our clinic review tool, if applicable. No additional management support is needed unless otherwise documented below in the visit note. 

## 2015-08-02 ENCOUNTER — Encounter: Payer: Self-pay | Admitting: Family Medicine

## 2015-08-02 LAB — HEMOGLOBIN A1C: Hgb A1c MFr Bld: 7 % — ABNORMAL HIGH (ref 4.6–6.5)

## 2015-08-02 LAB — COMPREHENSIVE METABOLIC PANEL
ALBUMIN: 3.9 g/dL (ref 3.5–5.2)
ALK PHOS: 60 U/L (ref 39–117)
ALT: 26 U/L (ref 0–35)
AST: 28 U/L (ref 0–37)
BUN: 9 mg/dL (ref 6–23)
CALCIUM: 10.1 mg/dL (ref 8.4–10.5)
CO2: 28 mEq/L (ref 19–32)
Chloride: 102 mEq/L (ref 96–112)
Creatinine, Ser: 0.9 mg/dL (ref 0.40–1.20)
GFR: 73.19 mL/min (ref >60.00)
Glucose, Bld: 100 mg/dL — ABNORMAL HIGH (ref 70–99)
POTASSIUM: 4.5 meq/L (ref 3.5–5.1)
SODIUM: 138 meq/L (ref 135–145)
TOTAL PROTEIN: 8.5 g/dL — AB (ref 6.0–8.3)
Total Bilirubin: 0.3 mg/dL (ref 0.2–1.2)

## 2015-08-04 ENCOUNTER — Telehealth: Payer: Self-pay | Admitting: Family Medicine

## 2015-08-04 DIAGNOSIS — M25561 Pain in right knee: Secondary | ICD-10-CM

## 2015-08-04 NOTE — Telephone Encounter (Signed)
Pt called wanting to get a referral orthopedic for right knee pain.  She would like to go to [place]

## 2015-08-04 NOTE — Telephone Encounter (Signed)
Referral placed.

## 2015-09-09 ENCOUNTER — Other Ambulatory Visit: Payer: Self-pay | Admitting: Family Medicine

## 2015-09-09 MED ORDER — ALPRAZOLAM 0.25 MG PO TABS: 0.2500 mg | ORAL_TABLET | Freq: Two times a day (BID) | ORAL | 0 refills | Status: DC | PRN

## 2015-09-09 NOTE — Telephone Encounter (Signed)
Patient left a voicemail stated that she has request refill of the Xanax 0.[facility] mg [facility] in the beginning of the week. Already checked the faxes and patient's chart. Did not see any request.  Please advise. Patient request refill of Xanax 0.25 mg. Last prescribed on 07/07/2015.

## 2015-09-12 NOTE — Telephone Encounter (Signed)
Called in medication to the pharmacy as instructed. 

## 2015-10-04 ENCOUNTER — Other Ambulatory Visit: Payer: Self-pay | Admitting: Family Medicine

## 2015-10-04 NOTE — Telephone Encounter (Signed)
Pt said [facility] [place] does not have metformin refill and wants to know why; advised pt was sent electronically today to [facility]. I spoke with amanda at [facility] and they did receive rx but are behind couple of hours. Pt notified and voiced understanding.

## 2015-10-11 ENCOUNTER — Other Ambulatory Visit: Payer: Self-pay | Admitting: *Deleted

## 2015-10-11 MED ORDER — ALPRAZOLAM 0.25 MG PO TABS: 0.2500 mg | ORAL_TABLET | Freq: Two times a day (BID) | ORAL | 0 refills | Status: DC | PRN

## 2015-10-11 NOTE — Telephone Encounter (Signed)
Last f/u 07/2015 

## 2015-10-11 NOTE — Telephone Encounter (Signed)
Rx called in to requested pharmacy 

## 2015-10-25 ENCOUNTER — Ambulatory Visit: Payer: Self-pay | Admitting: Family Medicine

## 2015-10-31 ENCOUNTER — Other Ambulatory Visit: Payer: Self-pay | Admitting: Family Medicine

## 2015-11-01 ENCOUNTER — Encounter: Payer: Self-pay | Admitting: Family Medicine

## 2015-11-01 ENCOUNTER — Ambulatory Visit (INDEPENDENT_AMBULATORY_CARE_PROVIDER_SITE_OTHER): Payer: BC Managed Care – PPO | Admitting: Family Medicine

## 2015-11-01 VITALS — BP 124/64 | HR 76 | Temp 98.3°F | Wt 335.5 lb

## 2015-11-01 DIAGNOSIS — F331 Major depressive disorder, recurrent, moderate: Secondary | ICD-10-CM | POA: Diagnosis not present

## 2015-11-01 DIAGNOSIS — Z23 Encounter for immunization: Secondary | ICD-10-CM | POA: Diagnosis not present

## 2015-11-01 DIAGNOSIS — E0822 Diabetes mellitus due to underlying condition with diabetic chronic kidney disease: Secondary | ICD-10-CM

## 2015-11-01 DIAGNOSIS — G4733 Obstructive sleep apnea (adult) (pediatric): Secondary | ICD-10-CM

## 2015-11-01 LAB — HEMOGLOBIN A1C: HEMOGLOBIN A1C: 7.6 % — AB (ref 4.6–6.5)

## 2015-11-01 MED ORDER — PNEUMOCOCCAL VAC POLYVALENT 25 MCG/0.5ML IJ INJ
0.5000 mL | INJECTION | INTRAMUSCULAR | Status: AC
  Administered 2015-11-01: 0.5 mL via INTRAMUSCULAR

## 2015-11-01 NOTE — Progress Notes (Signed)
40  yo female with h/o morbid obesity, DM, and depression, here for follow up.  Obesity-  [facility]  She is frustrated that she has not lost weight this month.  Last week, she went to aerobics 6 out of 7 days.    DM- not checking FSBS anymore. . No recent hypoglycemia. Lab Results  Component Value Date   HGBA1C 7.0 (H) 08/01/2015    On Metformin 1000 mg bid.    Pneumovax 10/20/09  Lab Results  Component Value Date   CHOL 113 04/12/2015   HDL 38.70 (L) 04/12/2015   LDLCALC 53 04/12/2015   TRIG 108.0 04/12/2015   CHOLHDL 3 04/12/2015    OSA- She still has not used her CPAP.  Says it makes her feel trapped.     Wt Readings from Last 3 Encounters:  11/01/15 (!) 335 lb 8 oz (152.2 kg)  08/01/15 (!) 332 lb 5 oz (150.7 kg)  06/16/15 (!) 336 lb 12 oz (152.7 kg)   Doing well on Wellubtrin at current dose.  Denies feeling depressed or anxious.  Patient Active Problem List  Diagnosis  . Diabetes mellitus with renal complications (HCC)  . POLYCYSTIC OVARIES  . DEPRESSION, MAJOR, RECURRENT, MODERATE  . Obesity, morbid, BMI 50 or higher (HCC)  . OSA (obstructive sleep apnea)  . Edema  . Transient adjustment reaction with anxiety   Past Medical History:  Diagnosis Date  . Depression   . Diabetes mellitus   . Obesity   . PCOS (polycystic ovarian syndrome)    Past Surgical History:  Procedure Laterality Date  . BREAST SURGERY     breast reduction  . CHOLECYSTECTOMY     Social History  Substance Use Topics  . Smoking status: Never Smoker  . Smokeless tobacco: Never Used  . Alcohol use Yes     Comment: 4-5 drinks per week   Family History  Problem Relation Age of Onset  . Heart attack Father [id number]  . Cancer Mother     adenoid cystic carcinoma  . Emphysema Paternal Grandfather   . Emphysema Maternal Grandfather   . Heart disease Maternal Grandmother   . Cancer Maternal Grandfather   . Cancer Paternal Grandfather    Allergies   Allergen Reactions  . Morphine     REACTION: vomiting   Current Outpatient Prescriptions on File Prior to Visit  Medication Sig Dispense Refill  . ALPRAZolam (XANAX) 0.25 MG tablet Take 1 tablet (0.25 mg total) by mouth 2 (two) times daily as needed for anxiety. 60 tablet 0  . buPROPion (WELLBUTRIN SR) 150 MG 12 hr tablet TAKE 1 TABLET (150 MG TOTAL) BY MOUTH DAILY. 90 tablet 1  . Ferrous Sulfate (IRON) 28 MG TABS Take 1 tablet by mouth daily.    . folic acid (FOLVITE) 1 MG tablet Take 1 mg by mouth daily.    Marland Kitchen. lisinopril (PRINIVIL,ZESTRIL) 5 MG tablet TAKE 1 TABLET (5 MG TOTAL) BY MOUTH DAILY. 90 tablet 2  . metFORMIN (GLUCOPHAGE) 1000 MG tablet TAKE 1 TABLET  BY MOUTH 2 (TWO) TIMES DAILY WITH A MEAL. 60 tablet 2  . phentermine 37.5 MG capsule Take 1 capsule (37.5 mg total) by mouth every morning. 30 capsule 2  . tranexamic acid (LYSTEDA) 650 MG TABS tablet Take 1,300 mg by mouth 3 (three) times daily. While on menstrual cycle     No current facility-administered medications on file prior to visit.       The PMH, PSH,  Social History, Family History, Medications, and allergies [facility], and have been updated if relevant.   Review of Systems    Review of Systems  Constitutional: Negative.   HENT: Negative.   Eyes: Negative.   Respiratory: Negative.   Cardiovascular: Negative.   Gastrointestinal: Negative.   Endocrine: Negative.   Musculoskeletal: Negative.   Skin: Negative.   Allergic/Immunologic: Negative.   Neurological: Negative.   Hematological: Negative.   Psychiatric/Behavioral: Negative.      Physical Exam BP 124/64   Pulse 76   Temp 98.3 F (36.8 C) (Oral)   Wt (!) 335 lb 8 oz (152.2 kg)   SpO2 100%   BMI 55.83 kg/m    Wt Readings from Last 3 Encounters:  11/01/15 (!) 335 lb 8 oz (152.2 kg)  08/01/15 (!) 332 lb 5 oz (150.7 kg)  06/16/15 (!) 336 lb 12 oz (152.7 kg)     General:  morbidly obese, NAD Mouth:  good dentition.   Neck:   No deformities, masses, or tenderness noted. Lungs:  Normal respiratory effort, chest expands symmetrically. Lungs are clear to auscultation, no crackles or wheezes. Heart:  Normal rate and regular rhythm. S1 and S2 normal without gallop, murmur, click, rub or other extra sounds. Neurologic:  No cranial nerve deficits noted. Station and gait are normal. Plantar reflexes are down-going bilaterally. DTRs are symmetrical throughout. Sensory, motor and coordinative functions appear intact. Ext:  No edema Psych:   Good eye contact, not tearful today. Does appear anxious Cognition and judgment appear intact. Alert and cooperative with normal attention span and concentration. No apparent delusions, illusions, hallucinations

## 2015-11-01 NOTE — Patient Instructions (Signed)
Great to see you! Keep up the great work!!    

## 2015-11-01 NOTE — Assessment & Plan Note (Signed)
Well controlled.  No changes made. 

## 2015-11-01 NOTE — Progress Notes (Signed)
Pre visit review using our clinic review tool, if applicable. No additional management support is needed unless otherwise documented below in the visit note. 

## 2015-11-01 NOTE — Assessment & Plan Note (Signed)
Has been improving. Recheck a1c. Pneumovax given today.

## 2015-11-01 NOTE — Assessment & Plan Note (Signed)
Compliant with CPAP. No changes made.

## 2015-11-01 NOTE — Assessment & Plan Note (Signed)
She is frustrated that the scales do not show improvement. Advised not to give up- keep working hard. She feels more fit, less winded and clothes are fitting better. Follow up in 3 months.

## 2015-11-08 ENCOUNTER — Other Ambulatory Visit: Payer: Self-pay

## 2015-11-08 MED ORDER — PHENTERMINE HCL 37.5 MG PO CAPS: 37.5000 mg | ORAL_CAPSULE | ORAL | 0 refills | Status: DC

## 2015-11-08 NOTE — Telephone Encounter (Signed)
Last filled 08/01/15 #30 + 2, last OV 11/01/15. Ok to refill? She said [facility] has sent a request but I don't see where they sent it. Thanks.

## 2015-11-08 NOTE — Telephone Encounter (Signed)
[address] [facility] [address] [place] [id number] [phone]

## 2015-11-16 ENCOUNTER — Other Ambulatory Visit: Payer: Self-pay | Admitting: *Deleted

## 2015-11-16 MED ORDER — ALPRAZOLAM 0.25 MG PO TABS: 0.2500 mg | ORAL_TABLET | Freq: Two times a day (BID) | ORAL | 0 refills | Status: DC | PRN

## 2015-11-16 NOTE — Telephone Encounter (Signed)
Last f/u 10/2015 

## 2015-11-17 NOTE — Telephone Encounter (Signed)
Rx called in to requested pharmacy 

## 2015-12-11 ENCOUNTER — Other Ambulatory Visit: Payer: Self-pay | Admitting: Family Medicine

## 2015-12-13 ENCOUNTER — Other Ambulatory Visit: Payer: Self-pay | Admitting: Family Medicine

## 2015-12-13 NOTE — Telephone Encounter (Signed)
Last f/u 10/2015 

## 2015-12-14 NOTE — Telephone Encounter (Signed)
Rx called in to requested pharmacy 

## 2015-12-16 NOTE — Telephone Encounter (Signed)
Pt left v/m requesting refill phentermine [facility] [place]. [facility] [place] and r x ready for pick up. V/m not set up; unable to leave message; Maralyn Sagosarah said [facility] notified pt rx ready for pick up.

## 2015-12-30 ENCOUNTER — Other Ambulatory Visit: Payer: Self-pay | Admitting: Family Medicine

## 2016-01-03 ENCOUNTER — Encounter: Payer: Self-pay | Admitting: Family Medicine

## 2016-01-03 LAB — HM DIABETES EYE EXAM

## 2016-01-04 ENCOUNTER — Ambulatory Visit (INDEPENDENT_AMBULATORY_CARE_PROVIDER_SITE_OTHER): Payer: BC Managed Care – PPO | Admitting: Family Medicine

## 2016-01-04 ENCOUNTER — Encounter: Payer: Self-pay | Admitting: Family Medicine

## 2016-01-04 VITALS — BP 110/70 | HR 102 | Temp 99.1°F | Ht 65.0 in | Wt 332.5 lb

## 2016-01-04 DIAGNOSIS — J0110 Acute frontal sinusitis, unspecified: Secondary | ICD-10-CM

## 2016-01-04 DIAGNOSIS — J029 Acute pharyngitis, unspecified: Secondary | ICD-10-CM | POA: Diagnosis not present

## 2016-01-04 DIAGNOSIS — J019 Acute sinusitis, unspecified: Secondary | ICD-10-CM | POA: Insufficient documentation

## 2016-01-04 LAB — POCT RAPID STREP A (OFFICE): RAPID STREP A SCREEN: NEGATIVE

## 2016-01-04 MED ORDER — BENZONATATE 200 MG PO CAPS: 200.0000 mg | ORAL_CAPSULE | Freq: Three times a day (TID) | ORAL | 1 refills | Status: DC | PRN

## 2016-01-04 MED ORDER — AMOXICILLIN-POT CLAVULANATE 875-125 MG PO TABS: 1.0000 | ORAL_TABLET | Freq: Two times a day (BID) | ORAL | 0 refills | Status: DC

## 2016-01-04 NOTE — Assessment & Plan Note (Signed)
S/p uri with ST and congestion and low grade temp elevation 2 wk after start of uri tx with augmentin bid 7 d Tessalon for cough Fluids/rest Disc symptomatic care - see instructions on AVS  Update if not starting to improve in a week or if worsening

## 2016-01-04 NOTE — Progress Notes (Signed)
   Subjective:    Patient ID: Kristy Conrad, female    DOB: 01/03/1975, 41 y.o.   MRN: [id number]  HPI Here with uri symptoms incl cough /congestion   Started with cold symptoms 2 wk ago  Then ST over the weekend  Now it is worse  Miserable night last night   Has been congested  A lot of phlegm for the past 2 weeks-hard to know if she has pnd   Prod cough- small amt of mucous/ thick  No wheezing   No sinus pain / maybe some pressure  Ears hurt on and off   Is diabetic - does not check her blood sugar    Temp is 99.1 today Unsure if she had a fever at home  ? If chills - tends to be hot natured but colder lately  No body aches until today- just a little bit   Does not feel like she has the flu   Otc: chloraseptic losenges  No analgesics  Results for orders placed or performed in visit on 01/04/16  POCT rapid strep A  Result Value Ref Range   Rapid Strep A Screen Negative Negative    Review of Systems  Constitutional: Positive for appetite change, fatigue and fever.  HENT: Positive for congestion, postnasal drip, rhinorrhea, sinus pressure, sneezing and sore throat. Negative for ear pain.   Eyes: Negative for pain and discharge.  Respiratory: Positive for cough. Negative for shortness of breath, wheezing and stridor.   Cardiovascular: Negative for chest pain.  Gastrointestinal: Negative for diarrhea, nausea and vomiting.  Genitourinary: Negative for frequency, hematuria and urgency.  Musculoskeletal: Negative for arthralgias and myalgias.  Skin: Negative for rash.  Neurological: Positive for headaches. Negative for dizziness, weakness and light-headedness.  Psychiatric/Behavioral: Negative for confusion and dysphoric mood.          Objective:   Physical Exam  Constitutional: She appears well-developed and well-nourished. No distress.  HENT:  Head: Normocephalic and atraumatic.  Right Ear: External ear normal.  Left Ear: External ear normal.  Mouth/Throat:  Oropharynx is clear and moist. No oropharyngeal exudate.  Nares are injected and congested  Bilateral frontal sinus discomfort/not pain   Post nasal drip   Eyes: Conjunctivae and EOM are normal. Pupils are equal, round, and reactive to light. Right eye exhibits no discharge. Left eye exhibits no discharge.  Neck: Normal range of motion. Neck supple.  Cardiovascular: Normal rate and regular rhythm.   Pulmonary/Chest: Effort normal and breath sounds normal. No respiratory distress. She has no wheezes. She has no rales.  Harsh bs Good air exch  Lymphadenopathy:    She has no cervical adenopathy.  Neurological: She is alert. No cranial nerve deficit.  Skin: Skin is warm and dry. No rash noted.  Psychiatric: She has a normal mood and affect.          Assessment & Plan:   Problem List Items Addressed This Visit      Respiratory   Acute sinusitis    S/p uri with ST and congestion and low grade temp elevation 2 wk after start of uri tx with augmentin bid 7 d Tessalon for cough Fluids/rest Disc symptomatic care - see instructions on AVS  Update if not starting to improve in a week or if worsening        Relevant Medications   amoxicillin-clavulanate (AUGMENTIN) 875-125 MG tablet   benzonatate (TESSALON) 200 MG capsule

## 2016-01-04 NOTE — Progress Notes (Signed)
Pre visit review using our clinic review tool, if applicable. No additional management support is needed unless otherwise documented below in the visit note. 

## 2016-01-04 NOTE — Patient Instructions (Signed)
I think you are developing a sinus infection from the cold you have been fighting  Take the augmentin as directed with food Drink lots of fluids and rest  Gargle with salt water for throat pain/ also lozenges are ok (do not go to sleep with a lozenge)  Try the tessalon for cough  Update if not starting to improve in a week or if worsening

## 2016-01-11 ENCOUNTER — Other Ambulatory Visit: Payer: Self-pay | Admitting: Family Medicine

## 2016-01-11 NOTE — Telephone Encounter (Signed)
Rx called in to requested pharmacy 

## 2016-01-11 NOTE — Telephone Encounter (Signed)
Pt has not been seen for obesity since 10/2015. Is she needing a f/u? pls advise

## 2016-01-17 ENCOUNTER — Other Ambulatory Visit: Payer: Self-pay | Admitting: Family Medicine

## 2016-01-17 ENCOUNTER — Encounter: Payer: Self-pay | Admitting: Family Medicine

## 2016-01-17 MED ORDER — AMOXICILLIN-POT CLAVULANATE 875-125 MG PO TABS: 1.0000 | ORAL_TABLET | Freq: Two times a day (BID) | ORAL | 0 refills | Status: DC

## 2016-01-31 LAB — HM PAP SMEAR: HM Pap smear: NEGATIVE

## 2016-02-02 ENCOUNTER — Ambulatory Visit (INDEPENDENT_AMBULATORY_CARE_PROVIDER_SITE_OTHER): Payer: BC Managed Care – PPO | Admitting: Family Medicine

## 2016-02-02 ENCOUNTER — Encounter: Payer: Self-pay | Admitting: Family Medicine

## 2016-02-02 DIAGNOSIS — E0822 Diabetes mellitus due to underlying condition with diabetic chronic kidney disease: Secondary | ICD-10-CM | POA: Diagnosis not present

## 2016-02-02 DIAGNOSIS — F331 Major depressive disorder, recurrent, moderate: Secondary | ICD-10-CM

## 2016-02-02 LAB — COMPREHENSIVE METABOLIC PANEL
ALBUMIN: 3.9 g/dL (ref 3.5–5.2)
ALT: 23 U/L (ref 0–35)
AST: 13 U/L (ref 0–37)
Alkaline Phosphatase: 59 U/L (ref 39–117)
BILIRUBIN TOTAL: 0.3 mg/dL (ref 0.2–1.2)
BUN: 14 mg/dL (ref 6–23)
CO2: 30 mEq/L (ref 19–32)
Calcium: 9.6 mg/dL (ref 8.4–10.5)
Chloride: 99 mEq/L (ref 96–112)
Creatinine, Ser: 0.84 mg/dL (ref 0.40–1.20)
GFR: 79.06 mL/min (ref >60.00)
Glucose, Bld: 130 mg/dL — ABNORMAL HIGH (ref 70–99)
Potassium: 4.4 mEq/L (ref 3.5–5.1)
SODIUM: 134 meq/L — AB (ref 135–145)
TOTAL PROTEIN: 7.7 g/dL (ref 6.0–8.3)

## 2016-02-02 LAB — LIPID PANEL
Cholesterol: 117 mg/dL (ref 0–200)
HDL: 38.1 mg/dL — ABNORMAL LOW (ref >39.00)
LDL Cholesterol: 58 mg/dL (ref 0–99)
NonHDL: 78.55
Total CHOL/HDL Ratio: 3
Triglycerides: 104 mg/dL (ref 0.0–149.0)
VLDL: 20.8 mg/dL (ref 0.0–40.0)

## 2016-02-02 LAB — CBC WITH DIFFERENTIAL/PLATELET
BASOS ABS: 0.1 10*3/uL (ref 0.0–0.1)
Basophils Relative: 1.2 % (ref 0.0–3.0)
EOS PCT: 2 % (ref 0.0–5.0)
Eosinophils Absolute: 0.2 10*3/uL (ref 0.0–0.7)
HEMATOCRIT: 34.5 % — AB (ref 36.0–46.0)
Hemoglobin: 10.8 g/dL — ABNORMAL LOW (ref 12.0–15.0)
LYMPHS ABS: 2.2 10*3/uL (ref 0.7–4.0)
LYMPHS PCT: 23.5 % (ref 12.0–46.0)
MCHC: 31.2 g/dL (ref 30.0–36.0)
MCV: 73.4 fl — AB (ref 78.0–100.0)
MONOS PCT: 10.3 % (ref 3.0–12.0)
Monocytes Absolute: 1 10*3/uL (ref 0.1–1.0)
NEUTROS ABS: 6 10*3/uL (ref 1.4–7.7)
NEUTROS PCT: 63 % (ref 43.0–77.0)
PLATELETS: 495 10*3/uL — AB (ref 150.0–400.0)
RBC: 4.7 Mil/uL (ref 3.87–5.11)
RDW: 18.3 % — ABNORMAL HIGH (ref 11.5–15.5)
WBC: 9.5 10*3/uL (ref 4.0–10.5)

## 2016-02-02 LAB — HEMOGLOBIN A1C: HEMOGLOBIN A1C: 7.1 % — AB (ref 4.6–6.5)

## 2016-02-02 MED ORDER — PHENTERMINE HCL 37.5 MG PO TABS: 37.5000 mg | ORAL_TABLET | Freq: Every morning | ORAL | 3 refills | Status: DC

## 2016-02-02 MED ORDER — BUPROPION HCL ER (XL) 300 MG PO TB24: 300.0000 mg | ORAL_TABLET | Freq: Every day | ORAL | 3 refills | Status: DC

## 2016-02-02 NOTE — Progress Notes (Signed)
41 yo pleasant female with h/o morbid obesity, DM, and depression, here for follow up.  Obesity-   Feels phentermine is working well.  Still trying to remain active.  She has stopped drinking sweet tea!  DM- not checking FSBS anymore.  No recent symptoms of hypoglycemia. Lab Results  Component Value Date   HGBA1C 7.6 (H) 11/01/2015    On Metformin 1000 mg bid.      Lab Results  Component Value Date   CHOL 113 04/12/2015   HDL 38.70 (L) 04/12/2015   LDLCALC 53 04/12/2015   TRIG 108.0 04/12/2015   CHOLHDL 3 04/12/2015    OSA- She still has not used her CPAP because she feels it is uncomfortable.   Wt Readings from Last 3 Encounters:  02/02/16 (!) 329 lb 4 oz (149.3 kg)  01/04/16 (!) 332 lb 8 oz (150.8 kg)  11/01/15 (!) 335 lb 8 oz (152.2 kg)   Depression- deteriorated.  Feels more tearful and has for months.  Feels more like being isolated from friends and staying home.  Sleeping ok.  Denies SI or HI.  Patient Active Problem List  Diagnosis  . Diabetes mellitus with renal complications (HCC)  . POLYCYSTIC OVARIES  . DEPRESSION, MAJOR, RECURRENT, MODERATE  . Obesity, morbid, BMI 50 or higher (HCC)  . OSA (obstructive sleep apnea)  . Edema  . Transient adjustment reaction with anxiety   Past Medical History:  Diagnosis Date  . Depression   . Diabetes mellitus   . Obesity   . PCOS (polycystic ovarian syndrome)    Past Surgical History:  Procedure Laterality Date  . BREAST SURGERY     breast reduction  . CHOLECYSTECTOMY     Social History  Substance Use Topics  . Smoking status: Never Smoker  . Smokeless tobacco: Never Used  . Alcohol use Yes     Comment: 4-5 drinks per week   Family History  Problem Relation Age of Onset  . Heart attack Father 2048  . Cancer Mother     adenoid cystic carcinoma  . Emphysema Paternal Grandfather   . Emphysema Maternal Grandfather   . Heart disease Maternal Grandmother   . Cancer Maternal Grandfather   . Cancer Paternal  Grandfather    Allergies  Allergen Reactions  . Morphine     REACTION: vomiting   Current Outpatient Prescriptions on File Prior to Visit  Medication Sig Dispense Refill  . ALPRAZolam (XANAX) 0.25 MG tablet Take 1 tablet (0.25 mg total) by mouth 2 (two) times daily as needed for anxiety. 60 tablet 0  . benzonatate (TESSALON) 200 MG capsule Take 1 capsule (200 mg total) by mouth 3 (three) times daily as needed. Swallow whole, do not bite pill 30 capsule 1  . buPROPion (WELLBUTRIN SR) 150 MG 12 hr tablet TAKE 1 TABLET (150 MG TOTAL) BY MOUTH DAILY. 90 tablet 1  . Ferrous Sulfate (IRON) 28 MG TABS Take 1 tablet by mouth daily.    . folic acid (FOLVITE) 1 MG tablet Take 1 mg by mouth daily.    Marland Kitchen. lisinopril (PRINIVIL,ZESTRIL) 5 MG tablet TAKE 1 TABLET (5 MG TOTAL) BY MOUTH DAILY. 90 tablet 2  . metFORMIN (GLUCOPHAGE) 1000 MG tablet TAKE 1 TABLET  BY MOUTH 2 (TWO) TIMES DAILY WITH A MEAL. 60 tablet 1  . phentermine (ADIPEX-P) 37.5 MG tablet TAKE ONE TABLET BY MOUTH EVERY MORNING 30 tablet 0  . tranexamic acid (LYSTEDA) 650 MG TABS tablet Take 1,300 mg by mouth 3 (three) times  daily. While on menstrual cycle     No current facility-administered medications on file prior to visit.       The PMH, PSH, Social History, Family History, Medications, and allergies have been reviewed in [facility], and have been updated if relevant.   Review of Systems    Review of Systems  Constitutional: Negative.   HENT: Negative.   Eyes: Negative.   Respiratory: Negative.   Cardiovascular: Negative.   Gastrointestinal: Negative.   Endocrine: Negative.   Musculoskeletal: Negative.   Skin: Negative.   Allergic/Immunologic: Negative.   Neurological: Negative.   Hematological: Negative.   Psychiatric/Behavioral: Positive for dysphoric mood. Negative for self-injury and sleep disturbance.     Physical Exam BP 124/62   Pulse 77   Temp 98.2 F (36.8 C) (Oral)   Wt (!) 329 lb 4 oz (149.3 kg)   LMP  01/14/2016   SpO2 99%   BMI 54.79 kg/m    Wt Readings from Last 3 Encounters:  02/02/16 (!) 329 lb 4 oz (149.3 kg)  01/04/16 (!) 332 lb 8 oz (150.8 kg)  11/01/15 (!) 335 lb 8 oz (152.2 kg)   Physical Exam  Constitutional: She is oriented to person, place, and time. No distress.  Morbidly obese  HENT:  Head: Normocephalic and atraumatic.  Eyes: Conjunctivae are normal.  Cardiovascular: Normal rate and regular rhythm.   Pulmonary/Chest: Effort normal and breath sounds normal.  Musculoskeletal: She exhibits no edema.  Neurological: She is alert and oriented to person, place, and time.  Skin: Skin is warm and dry. She is not diaphoretic.  Psychiatric: Mood, memory, affect and judgment normal.  Nursing note and vitals reviewed.

## 2016-02-02 NOTE — Assessment & Plan Note (Signed)
Deteriorated. Increase wellbutrin to 300 mg XL daily. She will update me in a few weeks. The patient indicates understanding of these issues and agrees with the plan.

## 2016-02-02 NOTE — Progress Notes (Signed)
Pre visit review using our clinic review tool, if applicable. No additional management support is needed unless otherwise documented below in the visit note. 

## 2016-02-02 NOTE — Assessment & Plan Note (Signed)
Likely improved- cut out sweet tea and is losing weight. Check labs today. The patient indicates understanding of these issues and agrees with the plan. Orders Placed This Encounter  Procedures  . Hemoglobin A1c  . Comprehensive metabolic panel  . Lipid panel  . CBC with Differential/Platelet

## 2016-02-02 NOTE — Assessment & Plan Note (Signed)
Improved.  Advised to restart an exercise routine- minimum of 30 minutes per day 4 days per week. Continue phentermine.  She is aware of risks. Rx printed and given to pt. Follow up in 3 months.

## 2016-02-02 NOTE — Patient Instructions (Addendum)
Great to see you. I will call you with your lab results from today.  Have a wonderful [birth year]!  We are increasing your Wellbutrin to 300 mg XL daily.  Please keep me updated.

## 2016-02-10 ENCOUNTER — Other Ambulatory Visit: Payer: Self-pay | Admitting: Family Medicine

## 2016-02-10 NOTE — Telephone Encounter (Signed)
Last f/u 02/2016

## 2016-02-13 NOTE — Telephone Encounter (Signed)
Rx called in to requested pharmacy 

## 2016-02-20 ENCOUNTER — Encounter: Payer: Self-pay | Admitting: Family Medicine

## 2016-02-20 ENCOUNTER — Other Ambulatory Visit (INDEPENDENT_AMBULATORY_CARE_PROVIDER_SITE_OTHER): Payer: BC Managed Care – PPO

## 2016-02-20 DIAGNOSIS — N912 Amenorrhea, unspecified: Secondary | ICD-10-CM | POA: Diagnosis not present

## 2016-02-20 NOTE — Telephone Encounter (Signed)
Pt called and scheduled labs for Tues 2/20. She needs orders.

## 2016-02-20 NOTE — Telephone Encounter (Signed)
Disregard prev msg, pt is coming this afternoon at 4pm. Needs orders.

## 2016-02-21 ENCOUNTER — Other Ambulatory Visit: Payer: BC Managed Care – PPO

## 2016-02-21 LAB — HCG, QUANTITATIVE, PREGNANCY: hCG, Beta Chain, Quant, S: <2 m[IU]/mL

## 2016-02-24 ENCOUNTER — Other Ambulatory Visit: Payer: Self-pay | Admitting: Family Medicine

## 2016-02-27 ENCOUNTER — Other Ambulatory Visit: Payer: Self-pay | Admitting: Family Medicine

## 2016-02-27 NOTE — Telephone Encounter (Signed)
Routing to PCP, Dr. Milinda Antisower saw pt 01/04/16 for an acute appt

## 2016-03-12 ENCOUNTER — Other Ambulatory Visit: Payer: Self-pay | Admitting: Family Medicine

## 2016-03-12 NOTE — Telephone Encounter (Signed)
Left refill on voice mail at pharmacy  

## 2016-03-12 NOTE — Telephone Encounter (Signed)
Last filled 02-13-16 #60 Last OV 02-02-16 No Future OV

## 2016-03-27 ENCOUNTER — Other Ambulatory Visit: Payer: Self-pay | Admitting: Family Medicine

## 2016-03-27 MED ORDER — BENZONATATE 200 MG PO CAPS: ORAL_CAPSULE | ORAL | 0 refills | Status: DC

## 2016-04-26 ENCOUNTER — Other Ambulatory Visit: Payer: Self-pay | Admitting: Family Medicine

## 2016-05-03 ENCOUNTER — Ambulatory Visit (INDEPENDENT_AMBULATORY_CARE_PROVIDER_SITE_OTHER): Payer: BC Managed Care – PPO | Admitting: Family Medicine

## 2016-05-03 ENCOUNTER — Encounter: Payer: Self-pay | Admitting: Family Medicine

## 2016-05-03 VITALS — BP 110/80 | HR 86 | Temp 98.8°F | Wt 326.2 lb

## 2016-05-03 DIAGNOSIS — E0822 Diabetes mellitus due to underlying condition with diabetic chronic kidney disease: Secondary | ICD-10-CM

## 2016-05-03 MED ORDER — PHENTERMINE HCL 37.5 MG PO TABS: 37.5000 mg | ORAL_TABLET | Freq: Every morning | ORAL | 3 refills | Status: DC

## 2016-05-03 MED ORDER — BENZONATATE 200 MG PO CAPS: ORAL_CAPSULE | ORAL | 3 refills | Status: DC

## 2016-05-03 NOTE — Progress Notes (Signed)
Pre visit review using our clinic review tool, if applicable. No additional management support is needed unless otherwise documented below in the visit note. 

## 2016-05-03 NOTE — Assessment & Plan Note (Signed)
Continue current rx. No signs of hypoglycemia even with weight loss. Check labs today. Orders Placed This Encounter  Procedures  . Comprehensive metabolic panel  . Lipid panel  . Hemoglobin A1c   The patient indicates understanding of these issues and agrees with the plan.

## 2016-05-03 NOTE — Progress Notes (Signed)
Subjective:   Patient ID: Kristy Conrad, female    DOB: 07-08-1975, 41 y.o.   MRN: [id number]  Kristy Conrad is a pleasant 41 y.o. year old female who presents to clinic today with 3 month follow-up Weight (She is doing awesome!!!)  on 05/03/2016  HPI:  Obesity-  Has 30 pounds!  Feels phentermine is working well.  Still trying to remain active.  She has stopped drinking sweet tea! She is now a Research scientist (life sciences).  Clothes fit differently.  She had a buy a new wardrobe.  She feels great. Wt Readings from Last 3 Encounters:  05/03/16 300 lb (136.1 kg)  02/02/16 (!) 329 lb 4 oz (149.3 kg)  01/04/16 (!) 332 lb 8 oz (150.8 kg)    DM-DM- not checking FSBS anymore.  No recent symptoms of hypoglycemia.   On Metformin 1000 mg bid.      Lab Results  Component Value Date   HGBA1C 7.1 (H) 02/02/2016     Current Outpatient Prescriptions on File Prior to Visit  Medication Sig Dispense Refill  . ALPRAZolam (XANAX) 0.25 MG tablet TAKE ONE TABLET BY MOUTH TWICE A DAY AS NEEDED FOR ANXIETY 60 tablet 0  . buPROPion (WELLBUTRIN XL) 300 MG 24 hr tablet Take 1 tablet (300 mg total) by mouth daily. 30 tablet 3  . Ferrous Sulfate (IRON) 28 MG TABS Take 1 tablet by mouth daily.    . folic acid (FOLVITE) 1 MG tablet Take 1 mg by mouth daily.    Marland Kitchen lisinopril (PRINIVIL,ZESTRIL) 5 MG tablet TAKE 1 TABLET (5 MG TOTAL) BY MOUTH DAILY. 90 tablet 2  . metFORMIN (GLUCOPHAGE) 1000 MG tablet TAKE 1 TABLET  BY MOUTH 2 (TWO) TIMES DAILY WITH A MEAL. 60 tablet 11  . tranexamic acid (LYSTEDA) 650 MG TABS tablet Take 1,300 mg by mouth 3 (three) times daily. While on menstrual cycle     No current facility-administered medications on file prior to visit.     Allergies  Allergen Reactions  . Morphine     REACTION: vomiting    Past Medical History:  Diagnosis Date  . Depression   . Diabetes mellitus   . Obesity   . PCOS (polycystic ovarian syndrome)     Past Surgical History:    Procedure Laterality Date  . BREAST SURGERY     breast reduction  . CHOLECYSTECTOMY      Family History  Problem Relation Age of Onset  . Heart attack Father 83  . Cancer Mother     adenoid cystic carcinoma  . Emphysema Paternal Grandfather   . Cancer Paternal Grandfather   . Emphysema Maternal Grandfather   . Cancer Maternal Grandfather   . Heart disease Maternal Grandmother     Social History   Social History  . Marital status: Single    Spouse name: N/A  . Number of children: N/A  . 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 Smoking status: Never Smoker  . Smokeless tobacco: Never Used  . Alcohol use Yes     Comment: 4-5 drinks per week  . Drug use: No  . Sexual activity: Yes    Partners: Male   Other Topics Concern  . Not on file   Social History Narrative   Regular exercise: no   Caffeine use: daily, tea   The PMH, PSH, Social History, Family History, Medications, and allergies [facility]  [facility], and have been updated if relevant.  Review of Systems  Constitutional: Negative.   HENT: Negative.   Eyes: Negative.   Respiratory: Negative.   Cardiovascular: Negative.   Gastrointestinal: Negative.   Endocrine: Negative.   Genitourinary: Negative.   Musculoskeletal: Negative.   Allergic/Immunologic: Negative.   Neurological: Negative.  Negative for dizziness.  Hematological: Negative.   Psychiatric/Behavioral: Negative.   All other systems reviewed and are negative.      Objective:    BP 110/80 (BP Location: Left Arm, Patient Position: Sitting, Cuff Size: Large)   Pulse 86   Temp 98.8 F (37.1 C) (Oral)   Wt 300 lb (136.1 kg)   LMP 04/22/2016 (Exact Date)   SpO2 98%   BMI 49.92 kg/m   Wt Readings from Last 3 Encounters:  05/03/16 300 lb (136.1 kg)  02/02/16 (!) 329 lb 4 oz (149.3 kg)  01/04/16 (!) 332 lb 8 oz (150.8 kg)    Physical Exam  Constitutional: She is  oriented to person, place, and time. She appears well-developed and well-nourished. No distress.  HENT:  Head: Normocephalic and atraumatic.  Eyes: Conjunctivae are normal.  Cardiovascular: Normal rate.   Pulmonary/Chest: Effort normal.  Musculoskeletal: Normal range of motion.  Neurological: She is alert and oriented to person, place, and time. No cranial nerve deficit.  Skin: Skin is warm and dry. She is not diaphoretic.  Psychiatric: She has a normal mood and affect. Her behavior is normal. Judgment and thought content normal.  Nursing note and vitals reviewed.         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

## 2016-05-03 NOTE — Assessment & Plan Note (Signed)
Has lost 30 pounds!  Congratulated her and encouraged her to continue with her lifestyle changes. She is aware of risks of phentermine. Phentermine rx printed and given to the patient. Follow up in 3-4 months. The patient indicates understanding of these issues and agrees with the plan.

## 2016-05-04 LAB — LIPID PANEL
Cholesterol: 128 mg/dL (ref 0–200)
HDL: 41.4 mg/dL (ref >39.00)
LDL CALC: 57 mg/dL (ref 0–99)
NONHDL: 86.17
Total CHOL/HDL Ratio: 3
Triglycerides: 144 mg/dL (ref 0.0–149.0)
VLDL: 28.8 mg/dL (ref 0.0–40.0)

## 2016-05-04 LAB — COMPREHENSIVE METABOLIC PANEL
ALT: 19 U/L (ref 0–35)
AST: 22 U/L (ref 0–37)
Albumin: 4.1 g/dL (ref 3.5–5.2)
Alkaline Phosphatase: 57 U/L (ref 39–117)
BUN: 15 mg/dL (ref 6–23)
CHLORIDE: 101 meq/L (ref 96–112)
CO2: 28 mEq/L (ref 19–32)
Calcium: 9.8 mg/dL (ref 8.4–10.5)
Creatinine, Ser: 0.87 mg/dL (ref 0.40–1.20)
GFR: 75.83 mL/min (ref >60.00)
GLUCOSE: 82 mg/dL (ref 70–99)
POTASSIUM: 4.2 meq/L (ref 3.5–5.1)
Sodium: 136 mEq/L (ref 135–145)
Total Bilirubin: 0.2 mg/dL (ref 0.2–1.2)
Total Protein: 8.2 g/dL (ref 6.0–8.3)

## 2016-05-04 LAB — HEMOGLOBIN A1C: HEMOGLOBIN A1C: 7.2 % — AB (ref 4.6–6.5)

## 2016-05-05 ENCOUNTER — Encounter: Payer: Self-pay | Admitting: Family Medicine

## 2016-05-07 NOTE — Telephone Encounter (Signed)
I have corrected it in the system. I wish she would have told me when I was weighing her that she did not think it was right.

## 2016-05-14 ENCOUNTER — Other Ambulatory Visit: Payer: Self-pay | Admitting: Family Medicine

## 2016-06-10 ENCOUNTER — Other Ambulatory Visit: Payer: Self-pay | Admitting: Family Medicine

## 2016-06-11 NOTE — Telephone Encounter (Signed)
Last refill 02/02/16 #30 +3 Last OV 05/03/16 Ok to refill?

## 2016-06-16 ENCOUNTER — Other Ambulatory Visit: Payer: Self-pay | Admitting: Family Medicine

## 2016-06-18 NOTE — Telephone Encounter (Signed)
Rx faxed to [facility] pharmacy.  

## 2016-06-18 NOTE — Telephone Encounter (Signed)
Pt is requesting refill on alprazolam 0.25mg   Last OV: 05/31/2016 Last Fill: 03/12/2016 #60 and 0RF UDS: None  Please advise.

## 2016-06-18 NOTE — Telephone Encounter (Signed)
Ok to refill as requested 

## 2016-08-31 ENCOUNTER — Other Ambulatory Visit: Payer: Self-pay | Admitting: Family Medicine

## 2016-09-04 NOTE — Telephone Encounter (Signed)
Rx request for Tessalon 200 mg Last OV 05/03/2016 Last refilled 05/03/2016 Next OV: none

## 2016-09-07 ENCOUNTER — Other Ambulatory Visit: Payer: Self-pay | Admitting: Family Medicine

## 2016-09-16 ENCOUNTER — Other Ambulatory Visit: Payer: Self-pay | Admitting: Family Medicine

## 2016-09-17 NOTE — Telephone Encounter (Signed)
Rx called to pharmacy as instructed. 

## 2016-09-17 NOTE — Telephone Encounter (Signed)
Received refill electronically Last refill 06/18/16 #60 Last office visit 05/03/16

## 2016-09-24 ENCOUNTER — Encounter: Payer: Self-pay | Admitting: Family Medicine

## 2016-09-24 ENCOUNTER — Ambulatory Visit (INDEPENDENT_AMBULATORY_CARE_PROVIDER_SITE_OTHER): Payer: BC Managed Care – PPO | Admitting: Family Medicine

## 2016-09-24 DIAGNOSIS — N926 Irregular menstruation, unspecified: Secondary | ICD-10-CM

## 2016-09-24 DIAGNOSIS — E0822 Diabetes mellitus due to underlying condition with diabetic chronic kidney disease: Secondary | ICD-10-CM | POA: Diagnosis not present

## 2016-09-24 MED ORDER — PHENTERMINE HCL 37.5 MG PO TABS: 37.5000 mg | ORAL_TABLET | Freq: Every morning | ORAL | 3 refills | Status: DC

## 2016-09-24 NOTE — Assessment & Plan Note (Signed)
Due for a1c. Continue current rxs.

## 2016-09-24 NOTE — Progress Notes (Signed)
Subjective:   Patient ID: Kristy Conrad, female    DOB: 1975-02-14, 41 y.o.   MRN: [id number]  Kristy Conrad is a pleasant 41 y.o. year old female year old female who presents to clinic today with Follow-up (3 month f/u)  on 09/24/2016  HPI:  Obesity-    Feels phentermine is working well.  Still trying to remain active.  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   Wt Readings from Last 3 Encounters:  09/24/16 (!) 329 lb 4 oz (149.3 kg)  05/03/16 (!) 326 lb 4 oz (148 kg)  02/02/16 (!) 329 lb 4 oz (149.3 kg)    DM-DM- not checking FSBS anymore.  No recent symptoms of hypoglycemia.   On Metformin 1000 mg bid.      Lab Results  Component Value Date   HGBA1C 7.2 (H) 05/03/2016     Current Outpatient Prescriptions on File Prior to Visit  Medication Sig Dispense Refill  . ALPRAZolam (XANAX) 0.25 MG tablet TAKE ONE TABLET BY MOUTH TWICE A DAY AS NEEDED FOR ANXIETY 60 tablet 0  . benzonatate (TESSALON) 200 MG capsule TAKE ONE CAPSULE BY MOUTH THREE TIMES A DAY AS NEEDED ... SWALLOW WHOLE, DO NOT BITE PILL 30 capsule 3  . benzonatate (TESSALON) 200 MG capsule TAKE ONE CAPSULE BY MOUTH THREE TIMES A DAY AS NEEDED. SWALLOW WHOLE. DO NOT BITE PILL. 30 capsule 0  . buPROPion (WELLBUTRIN XL) 300 MG 24 hr tablet TAKE ONE TABLET BY MOUTH DAILY 30 tablet 1  . Ferrous Sulfate (IRON) 28 MG TABS Take 1 tablet by mouth daily.    . folic acid (FOLVITE) 1 MG tablet Take 1 mg by mouth daily.    Marland Kitchen lisinopril (PRINIVIL,ZESTRIL) 5 MG tablet TAKE 1 TABLET (5 MG TOTAL) BY MOUTH DAILY. 90 tablet 3  . metFORMIN (GLUCOPHAGE) 1000 MG tablet TAKE 1 TABLET  BY MOUTH 2 (TWO) TIMES DAILY WITH A MEAL. 60 tablet 11  . phentermine (ADIPEX-P) 37.5 MG tablet Take 1 tablet (37.5 mg total) by mouth every morning. 30 tablet 3   No current facility-administered medications on file prior to visit.     Allergies  Allergen Reactions  . Morphine    REACTION: vomiting    Past Medical History:  Diagnosis Date  . Depression   . Diabetes mellitus   . Obesity   . PCOS (polycystic ovarian syndrome)     Past Surgical History:  Procedure Laterality Date  . BREAST SURGERY     breast reduction  . CHOLECYSTECTOMY      Family History  Problem Relation Age of Onset  . Heart attack Father 30  . Cancer Mother        adenoid cystic carcinoma  . Emphysema Paternal Grandfather   . Cancer Paternal Grandfather   . Emphysema Maternal Grandfather   . Cancer Maternal Grandfather   . Heart disease Maternal Grandmother     Social History   Social History  . Marital status: Single    Spouse name: N/A  . Number of children: N/A  . 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 Smoking status: Never Smoker  . Smokeless tobacco: Never Used  . Alcohol use Yes     Comment: 4-5 drinks per week  . Drug use: No  . Sexual activity: Yes    Partners: Male   Other Topics Concern  .  Not on file   Social History Narrative   Regular exercise: no   Caffeine use: daily, tea   The PMH, PSH, Social History, Family History, Medications, and allergies have been reviewed in [facility], and have been updated if relevant.  Review of Systems  Constitutional: Negative.   HENT: Negative.   Eyes: Negative.   Respiratory: Negative.   Cardiovascular: Negative.   Gastrointestinal: Negative.   Endocrine: Negative.   Genitourinary: Negative.   Musculoskeletal: Negative.   Allergic/Immunologic: Negative.   Neurological: Negative.  Negative for dizziness.  Hematological: Negative.   Psychiatric/Behavioral: Negative.   All other systems reviewed and are negative.      Objective:    BP (!) 142/80 (BP Location: Left Wrist, Patient Position: Sitting, Cuff Size: Normal)   Pulse 88   Temp 98.7 F (37.1 C) (Oral)   Wt (!) 329 lb 4 oz (149.3 kg)   SpO2 98%   BMI 54.79 kg/m   Wt Readings  from Last 3 Encounters:  09/24/16 (!) 329 lb 4 oz (149.3 kg)  05/03/16 (!) 326 lb 4 oz (148 kg)  02/02/16 (!) 329 lb 4 oz (149.3 kg)    Physical Exam  Constitutional: She is oriented to person, place, and time. She appears well-developed and well-nourished. No distress.  HENT:  Head: Normocephalic and atraumatic.  Eyes: Conjunctivae are normal.  Cardiovascular: Normal rate.   Pulmonary/Chest: Effort normal.  Musculoskeletal: Normal range of motion.  Neurological: She is alert and oriented to person, place, and time. No cranial nerve deficit.  Skin: Skin is warm and dry. She is not diaphoretic.  Psychiatric: She has a normal mood and affect. Her behavior is normal. Judgment and thought content normal.  Nursing note and vitals reviewed.         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

## 2016-09-24 NOTE — Assessment & Plan Note (Signed)
Aware of risks of phentermine and she would like to continue . Rx printed and given to pt. Follow up with new PCP in 3 months.

## 2016-09-25 LAB — HCG, QUANTITATIVE, PREGNANCY: QUANTITATIVE HCG: 0 m[IU]/mL

## 2016-09-25 LAB — HEMOGLOBIN A1C: HEMOGLOBIN A1C: 6.9 % — AB (ref 4.6–6.5)

## 2016-10-08 ENCOUNTER — Other Ambulatory Visit: Payer: Self-pay | Admitting: Family Medicine

## 2016-10-08 NOTE — Telephone Encounter (Signed)
Last refill 09/04/16  Last OV 09/24/16  Ok to refill?

## 2016-10-29 ENCOUNTER — Encounter: Payer: Self-pay | Admitting: Family Medicine

## 2016-11-06 ENCOUNTER — Other Ambulatory Visit: Payer: Self-pay | Admitting: Family Medicine

## 2016-11-11 ENCOUNTER — Other Ambulatory Visit: Payer: Self-pay | Admitting: Family Medicine

## 2016-11-12 ENCOUNTER — Other Ambulatory Visit: Payer: Self-pay

## 2016-11-12 NOTE — Telephone Encounter (Signed)
TA-Unsure if this is meant to be continued? Plz advise about refill req for Benzonatate/thx dmf

## 2016-11-13 ENCOUNTER — Other Ambulatory Visit: Payer: Self-pay | Admitting: Family Medicine

## 2016-11-13 NOTE — Telephone Encounter (Signed)
Faxed Rx to pharm/thx dmf

## 2016-11-13 NOTE — Telephone Encounter (Signed)
Requesting: Alprazolam Contract: None UDS: None Last OV: 9.24.2018 Next OV: Not scheduled Last Refill: 9.17.2018   Please advise

## 2016-12-23 ENCOUNTER — Other Ambulatory Visit: Payer: Self-pay | Admitting: Family Medicine

## 2017-02-03 ENCOUNTER — Other Ambulatory Visit: Payer: Self-pay | Admitting: Nurse Practitioner

## 2017-02-05 NOTE — Telephone Encounter (Signed)
Pt was instructed to F/U in Dec/denied/thx dmf

## 2017-02-19 ENCOUNTER — Other Ambulatory Visit: Payer: Self-pay | Admitting: Family Medicine

## 2017-03-01 ENCOUNTER — Other Ambulatory Visit: Payer: Self-pay | Admitting: Family Medicine

## 2017-03-05 ENCOUNTER — Other Ambulatory Visit: Payer: Self-pay | Admitting: Family Medicine

## 2017-03-05 MED ORDER — PHENTERMINE HCL 37.5 MG PO TABS: 37.5000 mg | ORAL_TABLET | Freq: Every morning | ORAL | 2 refills | Status: DC

## 2017-03-05 NOTE — Telephone Encounter (Signed)
Rx last filled 01/22/17

## 2017-03-05 NOTE — Addendum Note (Signed)
Addended by: Marcell AngerSELF, Wess Baney E on: 03/05/2017 04:13 PM   Modules accepted: Orders

## 2017-03-07 ENCOUNTER — Other Ambulatory Visit: Payer: Self-pay | Admitting: Family Medicine

## 2017-03-08 ENCOUNTER — Other Ambulatory Visit: Payer: Self-pay | Admitting: Family Medicine

## 2017-03-25 ENCOUNTER — Encounter: Payer: Self-pay | Admitting: Family Medicine

## 2017-03-25 ENCOUNTER — Telehealth: Payer: Self-pay | Admitting: Family Medicine

## 2017-03-25 ENCOUNTER — Ambulatory Visit: Payer: BC Managed Care – PPO | Admitting: Family Medicine

## 2017-03-25 VITALS — BP 126/78 | HR 85 | Temp 99.2°F | Ht 64.0 in | Wt 336.2 lb

## 2017-03-25 DIAGNOSIS — E0822 Diabetes mellitus due to underlying condition with diabetic chronic kidney disease: Secondary | ICD-10-CM

## 2017-03-25 LAB — POCT GLYCOSYLATED HEMOGLOBIN (HGB A1C): Hemoglobin A1C: 7.6

## 2017-03-25 MED ORDER — ALPRAZOLAM 0.25 MG PO TABS: ORAL_TABLET | ORAL | 2 refills | Status: DC

## 2017-03-25 MED ORDER — LISINOPRIL 5 MG PO TABS: ORAL_TABLET | ORAL | 3 refills | Status: DC

## 2017-03-25 NOTE — Progress Notes (Signed)
Subjective:   Patient ID: Kristy Conrad, female    DOB: [birth year], 42 y.o.   MRN: [id number]  Kristy Conrad is a pleasant 42 y.o. year old female who presents to clinic today with Diabetes (Pt is here today for a 43-month-F/U DM.  She currently takes Metformin 100mg  bid and occas forgets the am dosage.  She has an eye exam scheduled for 3.27.19 @ 2:15pm.)  on 03/25/2017  HPI:  Obesity-    Feels phentermine is working well.  Still trying to remain active.  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  Admits to not following her diet as well during the holidays.   Wt Readings from Last 3 Encounters:  03/25/17 (!) 336 lb 3.2 oz (152.5 kg)  09/24/16 (!) 329 lb 4 oz (149.3 kg)  05/03/16 (!) 326 lb 4 oz (148 kg)    DM-DM- not checking FSBS anymore.  No recent symptoms of hypoglycemia.   On Metformin 1000 mg bid.       Lab Results  Component Value Date   HGBA1C 7.6 03/25/2017     Current Outpatient Medications on File Prior to Visit  Medication Sig Dispense Refill  . ALPRAZolam (XANAX) 0.25 MG tablet TAKE ONE TABLET BY MOUTH TWICE A DAY AS NEEDED FOR ANXIETY 60 tablet 2  . Biotin (BIOTIN 5000) 5 MG CAPS Take by mouth.    Marland Kitchen buPROPion (WELLBUTRIN XL) 300 MG 24 hr tablet TAKE ONE TABLET BY MOUTH DAILY 30 tablet 0  . cetirizine (ZYRTEC) 10 MG tablet Take 10 mg by mouth daily.    . folic acid (FOLVITE) 1 MG tablet Take 1 mg by mouth daily.    Marland Kitchen lisinopril (PRINIVIL,ZESTRIL) 5 MG tablet TAKE 1 TABLET (5 MG TOTAL) BY MOUTH DAILY. 90 tablet 3  . meloxicam (MOBIC) 15 MG tablet TAKE ONE TABLET BY MOUTH DAILY WITH FOOD    . metFORMIN (GLUCOPHAGE) 1000 MG tablet TAKE 1 TABLET  BY MOUTH 2 (TWO) TIMES DAILY WITH A MEAL. 180 tablet 0  . phentermine (ADIPEX-P) 37.5 MG tablet Take 1 tablet (37.5 mg total) by mouth every morning. 30 tablet 2  . Turmeric Curcumin 500 MG CAPS Take by mouth.     No current  facility-administered medications on file prior to visit.     Allergies  Allergen Reactions  . Morphine     REACTION: vomiting    Past Medical History:  Diagnosis Date  . Depression   . Diabetes mellitus   . Obesity   . PCOS (polycystic ovarian syndrome)     Past Surgical History:  Procedure Laterality Date  . BREAST SURGERY     breast reduction  . CHOLECYSTECTOMY      Family History  Problem Relation Age of Onset  . Heart attack Father 40  . Cancer Mother        adenoid cystic carcinoma  . Emphysema Paternal Grandfather   . Cancer Paternal Grandfather   . Emphysema Maternal Grandfather   . Cancer Maternal Grandfather   . Heart disease Maternal Grandmother     Social History   Socioeconomic History  . Marital status: Single    Spouse name: Not on file  . Number of children: Not on file  . Years of education: Not on file  . Highest education level: Not on file  Occupational History  . Occupation: Magazine features editor: [employer]  Social Needs  .  Financial resource strain: Not on file  . Food insecurity:    Worry: Not on file    Inability: Not on file  . Transportation needs:    Medical: Not on file    Non-medical: Not on file  Tobacco Use  . Smoking status: Never Smoker  . Smokeless tobacco: Never Used  Substance and Sexual Activity  . Alcohol use: Yes    Comment: 4-5 drinks per week  . Drug use: No  . Sexual activity: Yes    Partners: Male  Lifestyle  . Physical activity:    Days per week: Not on file    Minutes per session: Not on file  . Stress: Not on file  Relationships  . Social connections:    Talks on phone: Not on file    Gets together: Not on file    Attends religious service: Not on file    Active member of club or organization: Not on file    Attends meetings of clubs or organizations: Not on file    Relationship status: Not on file  . Intimate partner violence:    Fear of current or ex partner: Not on file     Emotionally abused: Not on file    Physically abused: Not on file    Forced sexual activity: Not on file  Other Topics Concern  . Not on file  Social History Narrative   Regular exercise: no   Caffeine use: daily, tea   The PMH, PSH, Social History, Family History, Medications, and allergies have been reviewed in [facility], [facility]  Review of Systems  Constitutional: Negative.   HENT: Negative.   Eyes: Negative.   Respiratory: Negative.   Cardiovascular: Negative.   Gastrointestinal: Negative.   Endocrine: Negative.   Genitourinary: Negative.   Musculoskeletal: Negative.   Allergic/Immunologic: Negative.   Neurological: Negative.  Negative for dizziness.  Hematological: Negative.   Psychiatric/Behavioral: Negative.   All other systems reviewed and are negative.      Objective:    BP 126/78 (BP Location: Left Arm, Patient Position: Sitting, Cuff Size: Normal)   Pulse 85   Temp 99.2 F (37.3 C) (Oral)   Ht 5\' 4"  (1.626 m)   Wt (!) 336 lb 3.2 oz (152.5 kg)   SpO2 97%   BMI 57.71 kg/m   Wt Readings from Last 3 Encounters:  03/25/17 (!) 336 lb 3.2 oz (152.5 kg)  09/24/16 (!) 329 lb 4 oz (149.3 kg)  05/03/16 (!) 326 lb 4 oz (148 kg)    Physical Exam  Constitutional: She is oriented to person, place, and time. She appears well-developed and well-nourished. No distress.  HENT:  Head: Normocephalic and atraumatic.  Eyes: Conjunctivae are normal.  Cardiovascular: Normal rate.  Pulmonary/Chest: Effort normal.  Musculoskeletal: Normal range of motion.  Neurological: She is alert and oriented to person, place, and time. No cranial nerve deficit.  Skin: Skin is warm and dry. She is not diaphoretic.  Psychiatric: She has a normal mood and affect. Her behavior is normal. Judgment and thought content normal.  Nursing note and vitals reviewed.         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

## 2017-03-25 NOTE — Assessment & Plan Note (Signed)
Deteriorated.  See above. 

## 2017-03-25 NOTE — Patient Instructions (Signed)
Great to see you. Please make an appointment to see Dr. Jordan LikesSchmitz on your way out and to see me in 6 months.

## 2017-03-25 NOTE — Telephone Encounter (Signed)
FYI: when checking out, pt states will call to schedule six month follow up appointment.

## 2017-03-25 NOTE — Assessment & Plan Note (Signed)
Deteriorated but admits to not following her diet over the holidays. Motivated to restart portion control, start exercising and restart phentermine.

## 2017-03-27 ENCOUNTER — Encounter: Payer: Self-pay | Admitting: Family Medicine

## 2017-03-27 ENCOUNTER — Ambulatory Visit: Payer: BC Managed Care – PPO | Admitting: Family Medicine

## 2017-03-27 VITALS — BP 116/82 | HR 91 | Ht 64.0 in

## 2017-03-27 DIAGNOSIS — M25561 Pain in right knee: Secondary | ICD-10-CM | POA: Diagnosis not present

## 2017-03-27 DIAGNOSIS — G8929 Other chronic pain: Secondary | ICD-10-CM | POA: Diagnosis not present

## 2017-03-27 DIAGNOSIS — M25562 Pain in left knee: Secondary | ICD-10-CM | POA: Insufficient documentation

## 2017-03-27 LAB — HM DIABETES EYE EXAM

## 2017-03-27 MED ORDER — DICLOFENAC SODIUM 2 % TD SOLN: 1.0000 "application " | Freq: Two times a day (BID) | TRANSDERMAL | 3 refills | Status: DC

## 2017-03-27 NOTE — Progress Notes (Signed)
[name] - 42 y.o. female MRN [id number]  Date of birth: [birth year]  SUBJECTIVE:  Including CC & ROS.  Chief Complaint  Patient presents with  . Knee Pain    right x2 years last inject 11/2016.     Avaree KAILEY ESQUILIN is a 42 y.o. female that is presenting with bilateral knee pain. The pain is acute on chronic in nature. Pain is medial and intermittent in nature. The pain is worse after she drives in her car to [facility]. Has received steroid injections in the past with limited success. Has been doing water aerobics which help. She denies any injury or surgery. Pain is moderate to severe in nature. No locking or giving way. Has swelling intermittently.  Review of xray of left and right knee shows degenerative changes, primarily in the medial compartment.    Review of Systems  Constitutional: Negative for fever.  HENT: Negative for congestion.   Respiratory: Negative for cough.   Cardiovascular: Negative for chest pain.  Gastrointestinal: Negative for abdominal pain.  Musculoskeletal: Positive for joint swelling. Negative for gait problem.  Skin: Negative for color change.  Neurological: Negative for weakness.  Hematological: Negative for adenopathy.  Psychiatric/Behavioral: Negative for agitation.    HISTORY: Past Medical, Surgical, Social, and Family History Reviewed & Updated per EMR.   Pertinent Historical Findings include:  Past Medical History:  Diagnosis Date  . Depression   . Diabetes mellitus   . Obesity   . PCOS (polycystic ovarian syndrome)     Past Surgical History:  Procedure Laterality Date  . BREAST SURGERY     breast reduction  . CHOLECYSTECTOMY      Allergies  Allergen Reactions  . Morphine     REACTION: vomiting    Family History  Problem Relation Age of Onset  . Heart attack Father 51  . Cancer Mother        adenoid cystic carcinoma  . Emphysema Paternal Grandfather   . Cancer Paternal Grandfather   . Emphysema Maternal Grandfather   .  Cancer Maternal Grandfather   . Heart disease Maternal Grandmother      Social History   Socioeconomic History  . Marital status: Single    Spouse name: Not on file  . Number of children: Not on file  . Years of education: Not on file  . Highest education level: Not on file  Occupational History  . Occupation: Magazine features editor: [employer]  Social Needs  . Financial resource strain: Not on file  . Food insecurity:    Worry: Not on file    Inability: Not on file  . Transportation needs:    Medical: Not on file    Non-medical: Not on file  Tobacco Use  . Smoking status: Never Smoker  . Smokeless tobacco: Never Used  Substance and Sexual Activity  . Alcohol use: Yes    Comment: 4-5 drinks per week  . Drug use: No  . Sexual activity: Yes    Partners: Male  Lifestyle  . Physical activity:    Days per week: Not on file    Minutes per session: Not on file  . Stress: Not on file  Relationships  . Social connections:    Talks on phone: Not on file    Gets together: Not on file    Attends religious service: Not on file    Active member of club or organization: Not on file    Attends meetings of clubs or organizations:  Not on file    Relationship status: Not on file  . Intimate partner violence:    Fear of current or ex partner: Not on file    Emotionally abused: Not on file    Physically abused: Not on file    Forced sexual activity: Not on file  Other Topics Concern  . Not on file  Social History Narrative   Regular exercise: no   Caffeine use: daily, tea     PHYSICAL EXAM:  VS: BP 116/82   Pulse 91   Ht 5\' 4"  (1.626 m)   SpO2 96%   BMI 57.71 kg/m  Physical Exam Gen: NAD, alert, cooperative with exam, well-appearing ENT: normal lips, normal nasal mucosa,  Eye: normal EOM, normal conjunctiva and lids CV:  no edema, +2 pedal pulses   Resp: no accessory muscle use, non-labored,  Skin: no rashes, no areas of induration  Neuro: normal tone, normal  sensation to touch Psych:  normal insight, alert and oriented MSK:  Right and left knee:  No obvious effusion  TTP of the medial joint line  Normal flexion and extension  Normal strength to resistance  Normal gait  Negative McMurray's  Neurovascularly intact.   Limited ultrasound: right knee:  No effusion in SPP  Normal QT and PT.  Significant degenerative change so the medial joint line with outpouching of the medial meniscus  Summary: medial joint space degenerative changes.   Ultrasound and interpretation by Clare GandyJeremy Lamira Borin, MD        ASSESSMENT & PLAN:   Chronic pain of right knee Pain likely related to degenerative changes. 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

## 2017-03-27 NOTE — Assessment & Plan Note (Signed)
Pain likely related to degenerative changes. 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

## 2017-03-27 NOTE — Patient Instructions (Addendum)
Take tylenol 650 mg three times a day is the best evidence based medicine we have for arthritis.   Glucosamine sulfate 750mg  twice a day is a supplement that has been shown to help moderate to severe arthritis.  Vitamin D 2000 IU daily  Fish oil 2 grams daily.   Tumeric 500mg  twice daily.   Capsaicin topically up to four times a day may also help with pain.  Cortisone injections are an option if these interventions do not seem to make a difference or need more relief.   Please follow up with me if you would like to try injections. Please let me know if you would like to try physical therapy.

## 2017-04-01 ENCOUNTER — Ambulatory Visit: Payer: BC Managed Care – PPO | Admitting: Family Medicine

## 2017-04-01 ENCOUNTER — Ambulatory Visit: Payer: Self-pay

## 2017-04-01 ENCOUNTER — Encounter: Payer: Self-pay | Admitting: Family Medicine

## 2017-04-01 VITALS — HR 88 | Temp 98.7°F | Ht 64.0 in | Wt 336.0 lb

## 2017-04-01 DIAGNOSIS — M25561 Pain in right knee: Principal | ICD-10-CM

## 2017-04-01 DIAGNOSIS — G8929 Other chronic pain: Secondary | ICD-10-CM

## 2017-04-01 NOTE — Assessment & Plan Note (Signed)
Pain has become severe in nature. 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

## 2017-04-01 NOTE — Progress Notes (Signed)
Kristy Conrad - 42 y.o. female MRN [id number]  Date of birth: 10-08-1975  SUBJECTIVE:  Including CC & ROS.  Chief Complaint  Patient presents with  . Follow-up    Kristy Conrad is a 42 y.o. female that is here for right knee pain follow up. Her pain has not improved. Pain is mild to severe. Admits to swelling and tenderness. Pain is located on the medial aspect of her right knee. Pain is mild to severe upon flexion. She drives long distances to work, notices the pain more after driving. Denies any mechanical symptoms. Has been trying Pennsaid samples. Has been doing aquatic aerobics. Has received an injection before.    Review of Systems  Constitutional: Negative for fever.  HENT: Negative for congestion.   Respiratory: Negative for choking.   Cardiovascular: Negative for chest pain.  Gastrointestinal: Negative for abdominal pain.  Musculoskeletal: Positive for arthralgias.  Skin: Negative for color change.  Allergic/Immunologic: Negative for immunocompromised state.  Neurological: Negative for weakness.  Hematological: Negative for adenopathy.  Psychiatric/Behavioral: Negative for agitation.    HISTORY: Past Medical, Surgical, Social, and Family History Reviewed & Updated per EMR.   Pertinent Historical Findings include:  Past Medical History:  Diagnosis Date  . Depression   . Diabetes mellitus   . Obesity   . PCOS (polycystic ovarian syndrome)     Past Surgical History:  Procedure Laterality Date  . BREAST SURGERY     breast reduction  . CHOLECYSTECTOMY      Allergies  Allergen Reactions  . Morphine     REACTION: vomiting    Family History  Problem Relation Age of Onset  . Heart attack Father 59  . Cancer Mother        adenoid cystic carcinoma  . Emphysema Paternal Grandfather   . Cancer Paternal Grandfather   . Emphysema Maternal Grandfather   . Cancer Maternal Grandfather   . Heart disease Maternal Grandmother      Social History    Socioeconomic History  . Marital status: Single    Spouse name: Not on file  . Number of children: Not on file  . Years of education: Not on file  . Highest education level: Not on file  Occupational History  . Occupation: Magazine features editor: [employer]  Social Needs  . Financial resource strain: Not on file  . Food insecurity:    Worry: Not on file    Inability: Not on file  . Transportation needs:    Medical: Not on file    Non-medical: Not on file  Tobacco Use  . Smoking status: Never Smoker  . Smokeless tobacco: Never Used  Substance and Sexual Activity  . Alcohol use: Yes    Comment: 4-5 drinks per week  . Drug use: No  . Sexual activity: Yes    Partners: Male  Lifestyle  . Physical activity:    Days per week: Not on file    Minutes per session: Not on file  . Stress: Not on file  Relationships  . Social connections:    Talks on phone: Not on file    Gets together: Not on file    Attends religious service: Not on file    Active member of club or organization: Not on file    Attends meetings of clubs or organizations: Not on file    Relationship status: Not on file  . Intimate partner violence:    Fear of current or ex partner: Not  on file    Emotionally abused: Not on file    Physically abused: Not on file    Forced sexual activity: Not on file  Other Topics Concern  . Not on file  Social History Narrative   Regular exercise: no   Caffeine use: daily, tea     PHYSICAL EXAM:  VS: Pulse 88   Temp 98.7 F (37.1 C) (Oral)   Ht 5\' 4"  (1.626 m)   Wt (!) 336 lb (152.4 kg)   SpO2 98%   BMI 57.67 kg/m  Physical Exam Gen: NAD, alert, cooperative with exam, well-appearing ENT: normal lips, normal nasal mucosa,  Eye: normal EOM, normal conjunctiva and lids CV:  no edema, +2 pedal pulses   Resp: no accessory muscle use, non-labored,  Skin: no rashes, no areas of induration  Neuro: normal tone, normal sensation to touch Psych:  normal insight,  alert and oriented MSK:  Right knee:  No effusion  Normal ROM  Normal strength to resistance  No instability  Negative McMurray's  No TTP of the medial or lateral joint line   Neurovascularly intact    Aspiration/Injection Procedure Note Kristy Conrad 01/27/1975  Procedure: Injection Indications: right knee pain   Procedure Details Consent: Risks of procedure as well as the alternatives and risks of each were explained to the (patient/caregiver).  Consent for procedure obtained. Time Out: Verified patient identification, verified procedure, site/side was marked, verified correct patient position, special equipment/implants available, medications/allergies/relevent history reviewed, required imaging and test results available.  Performed.  The area was cleaned with iodine and alcohol swabs.    The right knee superiorlateral SPP was injected using 1 cc's of 40 mg Depomedrol and 4 cc's of 0.25% benzocaine with a 22 1 1/2" needle.  Ultrasound was used. Images were obtained in Long views showing the injection.   A sterile dressing was applied.  Patient did tolerate procedure well.      ASSESSMENT & PLAN:   Chronic pain of right knee Pain has become severe in nature. 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

## 2017-04-07 ENCOUNTER — Other Ambulatory Visit: Payer: Self-pay | Admitting: Family Medicine

## 2017-04-08 ENCOUNTER — Other Ambulatory Visit: Payer: Self-pay

## 2017-04-08 ENCOUNTER — Encounter: Payer: Self-pay | Admitting: Family Medicine

## 2017-04-08 MED ORDER — MELOXICAM 15 MG PO TABS: ORAL_TABLET | ORAL | 1 refills | Status: DC

## 2017-05-18 ENCOUNTER — Other Ambulatory Visit: Payer: Self-pay | Admitting: Family Medicine

## 2017-06-04 ENCOUNTER — Encounter: Payer: Self-pay | Admitting: Family Medicine

## 2017-06-09 ENCOUNTER — Other Ambulatory Visit: Payer: Self-pay | Admitting: Family Medicine

## 2017-06-14 ENCOUNTER — Telehealth: Payer: Self-pay

## 2017-06-14 NOTE — Telephone Encounter (Signed)
PA initiated via CMM for Phentermine 37.5mg  #30 per 30d/thx dmf

## 2017-06-19 NOTE — Telephone Encounter (Signed)
PA approved from 6.14.19-9.14.19/thx dmf

## 2017-06-25 NOTE — Telephone Encounter (Signed)
Pt doesn't use this pharmacy. [facility] [address] [id number] [phone] (Phone) [phone] (Fax)

## 2017-06-25 NOTE — Telephone Encounter (Signed)
Pt just wants to make sure we do not have [facility] on file for her anymore, she only use [facility] for her pharmacy.

## 2017-09-03 ENCOUNTER — Ambulatory Visit: Payer: BC Managed Care – PPO | Admitting: Family Medicine

## 2017-09-03 ENCOUNTER — Encounter: Payer: Self-pay | Admitting: Family Medicine

## 2017-09-03 DIAGNOSIS — E0822 Diabetes mellitus due to underlying condition with diabetic chronic kidney disease: Secondary | ICD-10-CM | POA: Diagnosis not present

## 2017-09-03 DIAGNOSIS — F331 Major depressive disorder, recurrent, moderate: Secondary | ICD-10-CM | POA: Diagnosis not present

## 2017-09-03 DIAGNOSIS — M791 Myalgia, unspecified site: Secondary | ICD-10-CM | POA: Diagnosis not present

## 2017-09-03 DIAGNOSIS — G8929 Other chronic pain: Secondary | ICD-10-CM

## 2017-09-03 DIAGNOSIS — M25561 Pain in right knee: Secondary | ICD-10-CM

## 2017-09-03 LAB — LIPID PANEL
CHOL/HDL RATIO: 3
Cholesterol: 124 mg/dL (ref 0–200)
HDL: 37.8 mg/dL — ABNORMAL LOW (ref >39.00)
LDL CALC: 65 mg/dL (ref 0–99)
NonHDL: 85.82
Triglycerides: 103 mg/dL (ref 0.0–149.0)
VLDL: 20.6 mg/dL (ref 0.0–40.0)

## 2017-09-03 LAB — COMPREHENSIVE METABOLIC PANEL
ALBUMIN: 3.8 g/dL (ref 3.5–5.2)
ALT: 32 U/L (ref 0–35)
AST: 32 U/L (ref 0–37)
Alkaline Phosphatase: 60 U/L (ref 39–117)
BUN: 13 mg/dL (ref 6–23)
CHLORIDE: 99 meq/L (ref 96–112)
CO2: 26 meq/L (ref 19–32)
CREATININE: 0.73 mg/dL (ref 0.40–1.20)
Calcium: 9.5 mg/dL (ref 8.4–10.5)
GFR: 92.26 mL/min (ref >60.00)
Glucose, Bld: 194 mg/dL — ABNORMAL HIGH (ref 70–99)
POTASSIUM: 4.5 meq/L (ref 3.5–5.1)
SODIUM: 134 meq/L — AB (ref 135–145)
Total Bilirubin: 0.4 mg/dL (ref 0.2–1.2)
Total Protein: 7.8 g/dL (ref 6.0–8.3)

## 2017-09-03 LAB — VITAMIN D 25 HYDROXY (VIT D DEFICIENCY, FRACTURES): VITD: 27.08 ng/mL — AB (ref 30.00–100.00)

## 2017-09-03 LAB — HEMOGLOBIN A1C: Hgb A1c MFr Bld: 8.2 % — ABNORMAL HIGH (ref 4.6–6.5)

## 2017-09-03 MED ORDER — MELOXICAM 15 MG PO TABS: ORAL_TABLET | ORAL | 1 refills | Status: DC

## 2017-09-03 MED ORDER — METFORMIN HCL 1000 MG PO TABS: ORAL_TABLET | ORAL | 1 refills | Status: DC

## 2017-09-03 MED ORDER — ALPRAZOLAM 0.25 MG PO TABS: ORAL_TABLET | ORAL | 2 refills | Status: DC

## 2017-09-03 MED ORDER — PHENTERMINE HCL 37.5 MG PO TABS: 37.5000 mg | ORAL_TABLET | Freq: Every morning | ORAL | 2 refills | Status: DC

## 2017-09-03 NOTE — Progress Notes (Signed)
Subjective:   Patient ID: Kristy Conrad, female    DOB: 07/18/1975, 42 y.o.   MRN: [id number]  Kristy Conrad is a pleasant 42 y.o. year old female who presents to clinic today with Diabetes (Patient is here today for a 65-month-F/U DM.  At 3.25.19 OV she weighed 336 lbs and A1C was 7.6 and admitted to not following her diet over the holidays but expreessed that she was motiated to rester portion control, exercise, and Rx Phentermine.  She states that she does not check her BS and did not watch what she ate like she should have over the summer.  )  on 09/03/2017  HPI:  DM- admits to not checking her FSBS and watching her diet during the summer. On Metformin 1000 mg twice daily. Was not taking phentermine or at least not "listening to what it was telling her body." Knees were both bothering her and she injured her foot.  For several months, she has not been exercising the way that she was. 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  Work has been stressful but she has gone back to taking lower dose of xanax as (no more than 0.25 mg daily.  Also taking Wellbutrin 300 mg XR daily. She is unhappy at work but does not feel overtly depressed or anxious. Wt Readings from Last 3 Encounters:  09/03/17 (!) 338 lb (153.3 kg)  04/01/17 (!) 336 lb (152.4 kg)  03/25/17 (!) 336 lb 3.2 oz (152.5 kg)   BP Readings from Last 3 Encounters:  09/03/17 126/90  03/27/17 116/82  03/25/17 126/78      Lab Results  Component Value Date   HGBA1C 7.6 03/25/2017   Lab Results  Component Value Date   CHOL 128 05/03/2016   HDL 41.40 05/03/2016   LDLCALC 57 05/03/2016   TRIG 144.0 05/03/2016   CHOLHDL 3 05/03/2016     Current Outpatient Medications on File Prior to Visit  Medication Sig Dispense Refill  . Biotin (BIOTIN 5000) 5 MG CAPS Take by mouth.    Marland Kitchen buPROPion (WELLBUTRIN XL) 300 MG 24 hr tablet TAKE ONE TABLET BY MOUTH DAILY 90 tablet 1  . cetirizine (ZYRTEC) 10 MG  tablet Take 10 mg by mouth daily.    . folic acid (FOLVITE) 1 MG tablet Take 1 mg by mouth daily.    . Glucosamine HCl-MSM (GLUCOSAMINE-MSM PO) Take 1,500 mg by mouth daily.    Marland Kitchen lisinopril (PRINIVIL,ZESTRIL) 5 MG tablet TAKE 1 TABLET (5 MG TOTAL) BY MOUTH DAILY. 90 tablet 3  . Turmeric Curcumin 500 MG CAPS Take 3 capsules by mouth daily.      No current facility-administered medications on file prior to visit.     Allergies  Allergen Reactions  . Morphine     REACTION: vomiting    Past Medical History:  Diagnosis Date  . Depression   . Diabetes mellitus   . Obesity   . PCOS (polycystic ovarian syndrome)     Past Surgical History:  Procedure Laterality Date  . BREAST SURGERY     breast reduction  . CHOLECYSTECTOMY      Family History  Problem Relation Age of Onset  . Heart attack Father 68  . Cancer Mother        adenoid cystic carcinoma  . Emphysema Paternal Grandfather   . Cancer Paternal Grandfather   . Emphysema Maternal Grandfather   . Cancer Maternal Grandfather   . Heart disease Maternal Grandmother  Social History   Socioeconomic History  . Marital status: Single    Spouse name: Not on file  . Number of children: Not on file  . Years of education: Not on file  . Highest education level: Not on file  Occupational History  . Occupation: Magazine features editor: [employer]  Social Needs  . Financial resource strain: Not on file  . Food insecurity:    Worry: Not on file    Inability: Not on file  . Transportation needs:    Medical: Not on file    Non-medical: Not on file  Tobacco Use  . Smoking status: Never Smoker  . Smokeless tobacco: Never Used  Substance and Sexual Activity  . Alcohol use: Yes    Comment: 4-5 drinks per week  . Drug use: No  . Sexual activity: Yes    Partners: Male  Lifestyle  . Physical activity:    Days per week: Not on file    Minutes per session: Not on file  . Stress: Not on file  Relationships  . Social  connections:    Talks on phone: Not on file    Gets together: Not on file    Attends religious service: Not on file    Active member of club or organization: Not on file    Attends meetings of clubs or organizations: Not on file    Relationship status: Not on file  . Intimate partner violence:    Fear of current or ex partner: Not on file    Emotionally abused: Not on file    Physically abused: Not on file    Forced sexual activity: Not on file  Other Topics Concern  . Not on file  Social History Narrative   Regular exercise: no   Caffeine use: daily, tea   The PMH, PSH, Social History, Family History, Medications, and allergies have been reviewed in [facility], and have been updated if relevant.   Review of Systems  Constitutional: Negative.   Respiratory: Negative.   Cardiovascular: Negative.   Gastrointestinal: Negative.   Endocrine: Negative.   Genitourinary: Negative.   Musculoskeletal: Positive for arthralgias, back pain, gait problem, joint swelling and myalgias.  Skin: Negative.   Hematological: Negative.   Psychiatric/Behavioral: Negative.   All other systems reviewed and are negative.      Objective:    BP 126/90 (BP Location: Left Arm, Patient Position: Sitting, Cuff Size: Normal)   Pulse 96   Temp 98.4 F (36.9 C) (Oral)   Ht 5\' 4"  (1.626 m)   Wt (!) 338 lb (153.3 kg)   SpO2 98%   BMI 58.02 kg/m    Physical Exam  Constitutional: She is oriented to person, place, and time. She appears well-developed.  Morbidly obese  HENT:  Head: Normocephalic and atraumatic.  Eyes: EOM are normal.  Cardiovascular: Normal rate and regular rhythm.  Pulmonary/Chest: Effort normal and breath sounds normal.  Neurological: She is alert and oriented to person, place, and time. She displays normal reflexes. No cranial nerve deficit. Coordination normal.  Skin: Skin is warm and dry.  Psychiatric: She has a normal mood and affect. Her behavior is normal. Judgment and thought  content normal.  Nursing note and vitals reviewed.         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

## 2017-09-03 NOTE — Assessment & Plan Note (Signed)
Deteriorated. Discussed weight loss plan.   She would like to restart phentermine and lifestyle changes.  She is aware of potential risks and has tolerated it well.  Follow up in 3 months.

## 2017-09-03 NOTE — Assessment & Plan Note (Signed)
>  25 minutes spent in face to face time with patient, >50% spent in counselling or coordination of care discussing diabetes, obesity.  She is aware of the changes she needs to make and she was concerned about what her weight would be today when she got on the scale.  She wants to get back into a healthier life style.  Labs today.  No changes made to rxs.

## 2017-09-03 NOTE — Patient Instructions (Signed)
Great to see you. I will call you with your lab results from today and you can view them online.   

## 2017-09-19 ENCOUNTER — Encounter: Payer: Self-pay | Admitting: Family Medicine

## 2017-10-18 ENCOUNTER — Other Ambulatory Visit: Payer: Self-pay | Admitting: Family Medicine

## 2017-11-27 LAB — HM MAMMOGRAPHY

## 2017-12-23 ENCOUNTER — Other Ambulatory Visit: Payer: Self-pay | Admitting: Family Medicine

## 2017-12-23 NOTE — Telephone Encounter (Signed)
Yes.  She needs to be seen.  Let's check an a1c before we change her DM medications as that can be dangerous without knowing what her blood sugars have been.  I will refill phentermine now.

## 2017-12-23 NOTE — Telephone Encounter (Signed)
TA-LOV 9.3.19/advised to F/U in 3 months/her labs were completed and A1C was at 8.2 and you had asked her about if she is ok with medication/well she said she was ok with medication back on 9.19.19/are you ok with sending a month supply in and her scheduling an appt for January? And do you want to add something for DM? Plz advise/thx dmf

## 2017-12-31 ENCOUNTER — Ambulatory Visit (INDEPENDENT_AMBULATORY_CARE_PROVIDER_SITE_OTHER): Payer: BC Managed Care – PPO

## 2017-12-31 ENCOUNTER — Ambulatory Visit: Payer: BC Managed Care – PPO | Admitting: Family Medicine

## 2017-12-31 ENCOUNTER — Encounter: Payer: Self-pay | Admitting: Family Medicine

## 2017-12-31 VITALS — BP 122/78 | HR 108 | Temp 98.7°F | Ht 64.0 in | Wt 340.0 lb

## 2017-12-31 DIAGNOSIS — M25561 Pain in right knee: Secondary | ICD-10-CM

## 2017-12-31 DIAGNOSIS — G8929 Other chronic pain: Secondary | ICD-10-CM | POA: Diagnosis not present

## 2017-12-31 DIAGNOSIS — M25562 Pain in left knee: Secondary | ICD-10-CM

## 2017-12-31 NOTE — Patient Instructions (Signed)
Good to see you  I will call you with the results from today  You will get a call from the rep for the brace  Happy New year.

## 2017-12-31 NOTE — Progress Notes (Signed)
Kristy Conrad - 42 y.o. female MRN [id number]  Date of birth: 03/14/1975  SUBJECTIVE:  Including CC & ROS.  Chief Complaint  Patient presents with  . 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 female that is following up for left and right knee pain.  The right knee has been here as of late.  The left knee seems to be bothering her worse.  She is having pain over the medial aspect of the knee.  The pain is localized to the knee.  The pain is moderate to severe.  Pain is worse after she has been walking and at night.  No improvement with modalities to date.  Denies any mechanical symptoms.  No history of surgery.   Review of Systems  Constitutional: Negative for fever.  HENT: Negative for congestion.   Respiratory: Negative for cough.   Cardiovascular: Negative for chest pain.  Gastrointestinal: Negative for abdominal pain.  Musculoskeletal: Positive for arthralgias.  Skin: Negative for color change.  Neurological: Negative for weakness.  Hematological: Negative for adenopathy.  Psychiatric/Behavioral: Negative for agitation.    HISTORY: Past Medical, Surgical, Social, and Family History Reviewed & Updated per EMR.   Pertinent Historical Findings include:  Past Medical History:  Diagnosis Date  . Depression   . Diabetes mellitus   . Obesity   . PCOS (polycystic ovarian syndrome)     Past Surgical History:  Procedure Laterality Date  . BREAST SURGERY     breast reduction  . CHOLECYSTECTOMY      Allergies  Allergen Reactions  . Morphine     REACTION: vomiting    Family History  Problem Relation Age of Onset  . Heart attack Father [phone]  . Cancer Mother        adenoid cystic carcinoma  . Emphysema Paternal Grandfather   . Cancer Paternal Grandfather   . Emphysema Maternal Grandfather   . Cancer Maternal Grandfather   . Heart disease Maternal Grandmother      Social History   Socioeconomic History  . Marital status: Single    Spouse name: Not on file  . Number of children: Not on file  . Years of education: Not on file  . Highest education level: Not on file  Occupational History  . Occupation: Magazine features editorTeacher    Employer: [employer]  Social Needs  . Financial resource strain: Not on file  . Food insecurity:    Worry: Not on file    Inability: Not on file  . Transportation needs:    Medical: Not on file    Non-medical: Not on file  Tobacco Use  . Smoking status: Never Smoker  . Smokeless tobacco: Never Used  Substance and Sexual Activity  . Alcohol use: Yes    Comment: 4-5 drinks per week  . Drug use: No  . Sexual activity: Yes    Partners: Male  Lifestyle  . Physical activity:    Days per week: Not on file    Minutes per session: Not on file  . Stress: Not on file  Relationships  . Social connections:    Talks on phone: Not on file    Gets together: Not on file    Attends religious service: Not on file    Active member of club or organization: Not on file    Attends meetings of clubs or organizations: Not on file    Relationship status: Not on file  .  Intimate partner violence:    Fear of current or ex partner: Not on file    Emotionally abused: Not on file    Physically abused: Not on file    Forced sexual activity: Not on file  Other Topics Concern  . Not on file  Social History Narrative   Regular exercise: no   Caffeine use: daily, tea     PHYSICAL EXAM:  VS: BP 122/78   Pulse (!) 108   Temp 98.7 F (37.1 C) (Oral)   Ht 5\' 4"  (1.626 m)   Wt (!) 340 lb (154.2 kg)   SpO2 94%   BMI 58.36 kg/m  Physical Exam Gen: NAD, alert, cooperative with exam, well-appearing ENT: normal lips, normal nasal mucosa,  Eye: normal EOM, normal conjunctiva and lids CV:  no edema, +2 pedal pulses   Resp: no accessory muscle use, non-labored,  Skin: no rashes, no areas of induration  Neuro: normal tone, normal sensation to touch Psych:  normal insight, alert and oriented MSK:  Left and  right knee: No obvious effusion. Normal range of motion. Tenderness to palpation of the medial joint line on the left. Negative McMurray's test. Normal strength resistance. No pain with patellar grind or compression. Instability with valgus and varus stress testing. Neurovascular intact   Aspiration/Injection Procedure Note Kristy Conrad 01/01/1976  Procedure: Injection Indications: Left knee pain  Procedure Details Consent: Risks of procedure as well as the alternatives and risks of each were explained to the (patient/caregiver).  Consent for procedure obtained. Time Out: Verified patient identification, verified procedure, site/side was marked, verified correct patient position, special equipment/implants available, medications/allergies/relevent history reviewed, required imaging and test results available.  Performed.  The area was cleaned with iodine and alcohol swabs.    The left knee superior lateral suprapatellar pouch was injected using 1 cc's of 40 mg Kenalog and 4 cc's of 0.25% bupivacaine with a 22 1 1/2" needle.  Ultrasound was used. Images were obtained in Long views showing the injection.     A sterile dressing was applied.  Patient did tolerate procedure well.       ASSESSMENT & PLAN:   Acute pain of left knee Little no effusion in the suprapatellar pouch.  Pain is likely patellofemoral as well as degenerative. -Injection today. -X-ray. -She may benefit from a medial unloader brace due her thigh to calf ratio -Consider gel injections.  Chronic pain of right knee Has had improvement with the last injection.  Does have pain intermittently. -X-ray. -May benefit from a medial unloader brace due to her thigh to calf ratio.

## 2018-01-02 ENCOUNTER — Encounter: Payer: Self-pay | Admitting: Family Medicine

## 2018-01-02 ENCOUNTER — Telehealth: Payer: Self-pay | Admitting: Family Medicine

## 2018-01-02 ENCOUNTER — Ambulatory Visit: Payer: BC Managed Care – PPO | Admitting: Family Medicine

## 2018-01-02 NOTE — Progress Notes (Signed)
Subjective:   Patient ID: Kristy BonineAlyce O Deeney, female    DOB: 10/31/1975, 43 y.o.   MRN: [id number]  Kristy Boninelyce O Boldin is a pleasant 43 y.o. year old female who presents to clinic today with discuss health questions  on 01/02/2018  HPI:  Here to discuss several questions, mostly about her weight and her diabetes.  Has been on phentermine for some time and is now doing water aerobic. She is having severe knee pain (recently saw Dr. Jordan LikesSchmitz) and is asking what else she can do for weight loss.  Diabetes- currently taking Metformin 1000 mg twice daily. Lab Results  Component Value Date   HGBA1C 8.2 (H) 09/03/2017       Current Outpatient Medications on File Prior to Visit  Medication Sig Dispense Refill  . ALPRAZolam (XANAX) 0.25 MG tablet TAKE ONE TABLET BY MOUTH TWICE A DAY AS NEEDED FOR ANXIETY 60 tablet 2  . Biotin (BIOTIN 5000) 5 MG CAPS Take by mouth.    Marland Kitchen. buPROPion (WELLBUTRIN XL) 300 MG 24 hr tablet TAKE ONE TABLET BY MOUTH DAILY 90 tablet 0  . cetirizine (ZYRTEC) 10 MG tablet Take 10 mg by mouth daily.    . folic acid (FOLVITE) 1 MG tablet Take 1 mg by mouth daily.    . Glucosamine HCl-MSM (GLUCOSAMINE-MSM PO) Take 1,500 mg by mouth daily.    Marland Kitchen. lisinopril (PRINIVIL,ZESTRIL) 5 MG tablet TAKE 1 TABLET (5 MG TOTAL) BY MOUTH DAILY. 90 tablet 3  . meloxicam (MOBIC) 15 MG tablet TAKE ONE TABLET BY MOUTH DAILY WITH FOOD 90 tablet 1  . metFORMIN (GLUCOPHAGE) 1000 MG tablet TAKE 1 TABLET  BY MOUTH 2 (TWO) TIMES DAILY WITH A MEAL. 180 tablet 1  . phentermine (ADIPEX-P) 37.5 MG tablet TAKE ONE TABLET BY MOUTH EVERY MORNING 30 tablet 0  . Turmeric Curcumin 500 MG CAPS Take 3 capsules by mouth daily.      No current facility-administered medications on file prior to visit.     Allergies  Allergen Reactions  . Morphine     REACTION: vomiting    Past Medical History:  Diagnosis Date  . Depression   . Diabetes mellitus   . Obesity   . PCOS (polycystic ovarian syndrome)     Past  Surgical History:  Procedure Laterality Date  . BREAST SURGERY     breast reduction  . CHOLECYSTECTOMY      Family History  Problem Relation Age of Onset  . Heart attack Father [phone]  . Cancer Mother        adenoid cystic carcinoma  . Emphysema Paternal Grandfather   . Cancer Paternal Grandfather   . Emphysema Maternal Grandfather   . Cancer Maternal Grandfather   . Heart disease Maternal Grandmother     Social History   Socioeconomic History  . Marital status: Single    Spouse name: Not on file  . Number of children: Not on file  . Years of education: Not on file  . Highest education level: Not on file  Occupational History  . Occupation: Magazine features editorTeacher    Employer: [employer]  Social Needs  . Financial resource strain: Not on file  . Food insecurity:    Worry: Not on file    Inability: Not on file  . Transportation needs:    Medical: Not on file    Non-medical: Not on file  Tobacco Use  . Smoking status: Never Smoker  . Smokeless tobacco: Never Used  Substance and Sexual Activity  .  Alcohol use: Yes    Comment: 4-5 drinks per week  . Drug use: No  . Sexual activity: Yes    Partners: Male  Lifestyle  . Physical activity:    Days per week: Not on file    Minutes per session: Not on file  . Stress: Not on file  Relationships  . Social connections:    Talks on phone: Not on file    Gets together: Not on file    Attends religious service: Not on file    Active member of club or organization: Not on file    Attends meetings of clubs or organizations: Not on file    Relationship status: Not on file  . Intimate partner violence:    Fear of current or ex partner: Not on file    Emotionally abused: Not on file    Physically abused: Not on file    Forced sexual activity: Not on file  Other Topics Concern  . Not on file  Social History Narrative   Regular exercise: no   Caffeine use: daily, tea   The PMH, PSH, Social History, Family History, Medications, and  allergies have been reviewed in [facility], and have been updated if relevant.   Review of Systems  Constitutional: Negative.   HENT: Negative.   Eyes: Negative.   Respiratory: Negative.   Cardiovascular: Negative.   Musculoskeletal: Positive for arthralgias.  Skin: Negative.   Hematological: Negative.   Psychiatric/Behavioral: Negative.   All other systems reviewed and are negative.      Objective:    Pulse 84   Temp 98.8 F (37.1 C) (Oral)   Ht 5\' 4"  (1.626 m)   Wt (!) 336 lb 12.8 oz (152.8 kg)   SpO2 98%   BMI 57.81 kg/m   Wt Readings from Last 3 Encounters:  01/02/18 (!) 336 lb 12.8 oz (152.8 kg)  12/31/17 (!) 340 lb (154.2 kg)  09/03/17 (!) 338 lb (153.3 kg)    Physical Exam Vitals signs and nursing note reviewed.  Constitutional:      Appearance: She is obese.  HENT:     Head: Normocephalic.     Right Ear: Tympanic membrane normal.     Left Ear: Tympanic membrane normal.  Pulmonary:     Effort: Pulmonary effort is normal.  Neurological:     General: No focal deficit present.     Mental Status: She is alert. Mental status is at baseline.  Psychiatric:        Mood and Affect: Mood normal.        Thought Content: Thought content normal.        Judgment: Judgment normal.           Assessment & Plan:   No diagnosis found. No follow-ups on file.

## 2018-01-02 NOTE — Telephone Encounter (Signed)
Spoke with patient about results.   Myra Rude, MD [facility] 01/02/2018, 2:07 PM

## 2018-01-02 NOTE — Assessment & Plan Note (Signed)
>  25 minutes spent in face to face time with patient, >50% spent in counselling or coordination of care with diabetes.  ASK: We discussed the diagnosis of obesity with today and she agreed to give [place] permission to discuss obesity behavioral modification therapy today.   ASSESS:  has the diagnosis of obesity and her BMI today is 57.81 And Kristy Conrad is in the action stage of change    ADVISE: was educated on the multiple health risks of obesity as well as the benefit of weight loss to improve her health. She was advised of the need for long term treatment and the importance of lifestyle modifications to improve her current health and to decrease her risk of future health problems.   AGREE: Multiple dietary modification options and treatment options were discussed and patient agreed to follow the recommendations documented in the above note.   ARRANGE: Referredn to [facility] - ? trulicity as an option for her. Also discussed weight watchers.  She is ready to join.

## 2018-01-02 NOTE — Assessment & Plan Note (Signed)
Little no effusion in the suprapatellar pouch.  Pain is likely patellofemoral as well as degenerative. -Injection today. -X-ray. -She may benefit from a medial unloader brace due her thigh to calf ratio -Consider gel injections.

## 2018-01-02 NOTE — Assessment & Plan Note (Signed)
Has had improvement with the last injection.  Does have pain intermittently. -X-ray. -May benefit from a medial unloader brace due to her thigh to calf ratio.

## 2018-01-07 ENCOUNTER — Encounter: Payer: Self-pay | Admitting: Family Medicine

## 2018-01-18 ENCOUNTER — Other Ambulatory Visit: Payer: Self-pay | Admitting: Family Medicine

## 2018-02-01 ENCOUNTER — Other Ambulatory Visit: Payer: Self-pay | Admitting: Family Medicine

## 2018-03-10 ENCOUNTER — Other Ambulatory Visit: Payer: Self-pay | Admitting: Family Medicine

## 2018-03-24 ENCOUNTER — Encounter: Payer: Self-pay | Admitting: Family Medicine

## 2018-04-01 ENCOUNTER — Telehealth: Payer: Self-pay | Admitting: Family Medicine

## 2018-04-01 NOTE — Telephone Encounter (Signed)
Tried to call patient about message she sent wanting to set up a check up visit, left message

## 2018-04-14 ENCOUNTER — Encounter: Payer: Self-pay | Admitting: Family Medicine

## 2018-04-14 ENCOUNTER — Ambulatory Visit (INDEPENDENT_AMBULATORY_CARE_PROVIDER_SITE_OTHER): Payer: BC Managed Care – PPO | Admitting: Family Medicine

## 2018-04-14 DIAGNOSIS — F331 Major depressive disorder, recurrent, moderate: Secondary | ICD-10-CM | POA: Diagnosis not present

## 2018-04-14 DIAGNOSIS — E0822 Diabetes mellitus due to underlying condition with diabetic chronic kidney disease: Secondary | ICD-10-CM

## 2018-04-14 DIAGNOSIS — F4322 Adjustment disorder with anxiety: Secondary | ICD-10-CM

## 2018-04-14 MED ORDER — PHENTERMINE HCL 37.5 MG PO TABS: 37.5000 mg | ORAL_TABLET | Freq: Every morning | ORAL | 0 refills | Status: DC

## 2018-04-14 MED ORDER — MELOXICAM 15 MG PO TABS: ORAL_TABLET | ORAL | 1 refills | Status: DC

## 2018-04-14 MED ORDER — METFORMIN HCL 1000 MG PO TABS: ORAL_TABLET | ORAL | 1 refills | Status: DC

## 2018-04-14 MED ORDER — LISINOPRIL 5 MG PO TABS: ORAL_TABLET | ORAL | 3 refills | Status: DC

## 2018-04-14 MED ORDER — BUPROPION HCL ER (XL) 300 MG PO TB24: 300.0000 mg | ORAL_TABLET | Freq: Every day | ORAL | 1 refills | Status: DC

## 2018-04-14 MED ORDER — ALPRAZOLAM 0.25 MG PO TABS: ORAL_TABLET | ORAL | 1 refills | Status: DC

## 2018-04-14 NOTE — Assessment & Plan Note (Addendum)
She is aware of risks of phentermine, will try to find ways to exercise at home. Phentermine rx refilled. She has not yet called [facility].

## 2018-04-14 NOTE — Assessment & Plan Note (Signed)
>  25 minutes spent in face to face time with patient, >50% spent in counselling or coordination of care discussing diabetes, depression, anxiety and obesity. I did advise for her to check FSBS at home since we are not seeing her in the office (and therefore cannot check an a1c). Refill Metformin and lisinopril. She will follow up with me in 1- 2 months. The patient indicates understanding of these issues and agrees with the plan.

## 2018-04-14 NOTE — Assessment & Plan Note (Addendum)
She feels wellbutrin is working but adjusting to new schedule has caused increased anxiety. Refill current rxs and she will update me in the next 1- 2 months.

## 2018-04-14 NOTE — Progress Notes (Signed)
Virtual Visit via Video   I connected with Kristy Conrad on 04/14/18 at  8:40 AM EDT by a video enabled telemedicine application and verified that I am speaking with the correct person using two identifiers. Location patient: Home Location provider: [facility], Office Persons participating in the virtual visit: Yamilex Zenon Mayo, Ruthe Mannan, MD   I discussed the limitations of evaluation and management by telemedicine and the availability of in person appointments. The patient expressed understanding and agreed to proceed.  Subjective:   HPI:  Follow up-  DM- admits to not checking her FSBS regularly. On Metformin 1000 mg twice daily.  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  Left knee is really bothering her.  She is not exercising like she was.  She is working from home. Does want a refill of her phentermine.  Admits to stress eating but trying to eat healthier. Denies CP, HA, blurred vision, or SOB.  Anxiety /depression- she feels she cannot answer PHQ or GAD screening well given strange new situation working from home.  Work has been stressful but she has gone back to taking lower dose of xanax as (no more than 0.25 mg daily).  Also taking Wellbutrin 300 mg XR daily. She feels xanax is not working as well since covid crisis started and she has had to work from home but she is adjusting.  Depression screen [facility] 2/9 03/25/2017  Decreased Interest 0  Down, Depressed, Hopeless 0  PHQ - 2 Score 0  Altered sleeping 3  Tired, decreased energy 1  Change in appetite 0  Feeling bad or failure about yourself  0  Trouble concentrating 1  Moving slowly or fidgety/restless 2  Suicidal thoughts 0  PHQ-9 Score 7  Difficult doing work/chores Not difficult at all   No flowsheet data found.     ROS: See pertinent positives and negatives per HPI.  Review of Systems  Constitutional: Negative.    HENT: Negative.   Eyes: Negative.   Respiratory: Negative.   Cardiovascular: Negative.   Gastrointestinal: Negative.   Endocrine: Negative.   Genitourinary: Negative.   Musculoskeletal: Negative.   Skin: Negative.   Allergic/Immunologic: Negative.   Neurological: Negative.   Hematological: Negative.   Psychiatric/Behavioral: Negative for agitation, behavioral problems, confusion, decreased concentration, dysphoric mood, hallucinations and self-injury. The patient is nervous/anxious. The patient is not hyperactive.   All other systems reviewed and are negative.   Patient Active Problem List   Diagnosis Date Noted  . Chronic pain of right knee 03/27/2017  . Transient adjustment reaction with anxiety 05/02/2015  . Edema 01/19/2013  . OSA (obstructive sleep apnea) 09/15/2012  . Obesity, morbid, BMI 50 or higher (HCC) 11/12/2011  . 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 POLYCYSTIC OVARIES 07/08/2009  . DEPRESSION, MAJOR, RECURRENT, MODERATE 07/08/2009    Social History   Tobacco Use  . Smoking status: Never Smoker  . Smokeless tobacco: Never Used  Substance Use Topics  . Alcohol use: Yes    Comment: 4-5 drinks per week    Current Outpatient Medications:  .  ALPRAZolam (XANAX) 0.25 MG tablet, TAKE ONE TABLET BY MOUTH TWICE A DAY AS NEEDED FOR ANXIETY, Disp: 60 tablet, Rfl: 1 .  Biotin (BIOTIN 5000) 5 MG CAPS, Take by mouth., Disp: , Rfl:  .  buPROPion (WELLBUTRIN XL) 300 MG 24 hr tablet, Take 1 tablet (300 mg total)  by mouth daily., Disp: 90 tablet, Rfl: 1 .  cetirizine (ZYRTEC) 10 MG tablet, Take 10 mg by mouth daily., Disp: , Rfl:  .  folic acid (FOLVITE) 1 MG tablet, Take 1 mg by mouth daily., Disp: , Rfl:  .  Glucosamine HCl-MSM (GLUCOSAMINE-MSM PO), Take 1,500 mg by mouth daily., Disp: , Rfl:  .  lisinopril (PRINIVIL,ZESTRIL) 5 MG tablet, TAKE 1 TABLET (5 MG TOTAL) BY MOUTH DAILY., Disp: 90 tablet, Rfl: 3 .  meloxicam (MOBIC) 15 MG tablet, TAKE ONE  TABLET BY MOUTH DAILY WITH FOOD, Disp: 90 tablet, Rfl: 1 .  metFORMIN (GLUCOPHAGE) 1000 MG tablet, TAKE 1 TABLET  BY MOUTH 2 (TWO) TIMES DAILY WITH A MEAL., Disp: 180 tablet, Rfl: 1 .  phentermine (ADIPEX-P) 37.5 MG tablet, Take 1 tablet (37.5 mg total) by mouth every morning., Disp: 30 tablet, Rfl: 0 .  Turmeric Curcumin 500 MG CAPS, Take 3 capsules by mouth daily. , Disp: , Rfl:   Allergies  Allergen Reactions  . Morphine     REACTION: vomiting    Objective:   There were no vitals taken for this visit.  VITALS: Per patient if applicable, see vitals. GENERAL: Alert, appears well and in no acute distress. HEENT: Atraumatic, conjunctiva clear, no obvious abnormalities on inspection of external nose and ears. NECK: Normal movements of the head and neck. CARDIOPULMONARY: No increased WOB. Speaking in clear sentences. I:E ratio WNL.  MS: Moves all visible extremities without noticeable abnormality. PSYCH: Pleasant and cooperative, well-groomed. Speech normal rate and rhythm. Affect is appropriate. Insight and judgement are appropriate. Attention is focused, linear, and appropriate.  NEURO: CN grossly intact. Oriented as arrived to appointment on time with no prompting. Moves both UE equally.  SKIN: No obvious lesions, wounds, erythema, or cyanosis noted on face or hands.  Assessment and Plan:   Ameka was seen today for follow-up.  Diagnoses and all orders for this visit:  DEPRESSION, MAJOR, RECURRENT, MODERATE  Diabetes mellitus due to underlying condition with chronic kidney disease, without long-term current use of insulin, unspecified CKD stage (HCC)  Transient adjustment reaction with anxiety  Obesity, morbid, BMI 50 or higher (HCC)  Other orders -     buPROPion (WELLBUTRIN XL) 300 MG 24 hr tablet; Take 1 tablet (300 mg total) by mouth daily. -     lisinopril (PRINIVIL,ZESTRIL) 5 MG tablet; TAKE 1 TABLET (5 MG TOTAL) BY MOUTH DAILY. -     metFORMIN (GLUCOPHAGE) 1000 MG  tablet; TAKE 1 TABLET  BY MOUTH 2 (TWO) TIMES DAILY WITH A MEAL. -     phentermine (ADIPEX-P) 37.5 MG tablet; Take 1 tablet (37.5 mg total) by mouth every morning. -     meloxicam (MOBIC) 15 MG tablet; TAKE ONE TABLET BY MOUTH DAILY WITH FOOD    . Reviewed expectations re: course of current medical issues. . Discussed self-management of symptoms. . Outlined signs and symptoms indicating need for more acute intervention. . Patient verbalized understanding and all questions were answered. Marland Kitchen. Health Maintenance issues including appropriate healthy diet, exercise, and smoking avoidance were discussed with patient. . See orders for this visit as documented in the electronic medical record.  Ruthe Mannanalia Bellina Tokarczyk, MD 04/14/2018

## 2018-04-17 ENCOUNTER — Ambulatory Visit: Payer: Self-pay | Admitting: Family Medicine

## 2018-04-17 NOTE — Telephone Encounter (Signed)
Agent attempted to put pt. Through and call was dropped. Left message for pt. To call back.

## 2018-04-17 NOTE — Telephone Encounter (Signed)
Pt called in c/o having bad chills a scratchy throat and a headache since waking up this morning.   She normally very hot natured so it's unusual for her to be cold and can't get warm.   She also doesn't feel well. No known exposures to anyone with the COVID-19 virus. I let her know the doctors are doing video calls due to the COVID-19 pandemic.    See triage notes.  I warm transferred her to Dr. Elmer Sow office and connected her with [facility]. Reason for Disposition . [1] Sore throat is the only symptom AND [2] sore throat present < 48 hours  Answer Assessment - Initial Assessment Questions 1. ONSET: "When did the throat start hurting?" (Hours or days ago)      It's a scratchy throat.    I don't feel it's sore.   I woke up with it and a headache.  I'm a very hot natured person.  I woke up with chills.  No cough.   I have my smell.   I don't have a fever if so it's very low.    2. SEVERITY: "How bad is the sore throat?" (Scale 1-10; mild, moderate or severe)   - MILD (1-3):  doesn't interfere with eating or normal activities   - MODERATE (4-7): interferes with eating some solids and normal activities   - SEVERE (8-10):  excruciating pain, interferes with most normal activities   - SEVERE DYSPHAGIA: can't swallow liquids, drooling     I'm working from home.   I can't keep my eyes open because I didn't sleep well last night.   I was relestless. 3. STREP EXPOSURE: "Has there been any exposure to strep within the past week?" If so, ask: "What type of contact occurred?"      No exposure.   I live alone and stay inside except to go to the grocery store once a week. 4.  VIRAL SYMPTOMS: "Are there any symptoms of a cold, such as a runny nose, cough, hoarse voice or red eyes?"      No other symptoms.   I have a headache.   Sometimes I get a headache if I don't sleep well. 5. FEVER: "Do you have a fever?" If so, ask: "What is your temperature, how was it measured, and when did it start?"     I don't think  so.   Don't have a thermometer. 6. PUS ON THE TONSILS: "Is there pus on the tonsils in the back of your throat?"     No 7. OTHER SYMPTOMS: "Do you have any other symptoms?" (e.g., difficulty breathing, headache, rash)     I get a dry cough at night which is normal for me.   8. PREGNANCY: "Is there any chance you are pregnant?" "When was your last menstrual period?"     No  I have an IUD.  Protocols used: SORE THROAT-A-AH

## 2018-04-17 NOTE — Telephone Encounter (Signed)
Patient was transferred to office to schedule appointment. Patient was offered an appointment, but declined due to it being a virtual visit and wanted to be tested for COVID-19. Patient informed that we do not do any testing here and patient declined appointment.

## 2018-04-23 ENCOUNTER — Other Ambulatory Visit: Payer: Self-pay | Admitting: Family Medicine

## 2018-05-15 ENCOUNTER — Other Ambulatory Visit: Payer: Self-pay | Admitting: Family Medicine

## 2018-05-16 NOTE — Telephone Encounter (Signed)
MC-Plz see in TA's absence/pt last seen in April and advised to return in 1-2 months/I added note to pharmacy to advise pt that must schedule appt for June/Per [place]MP pt is compliant without red flags/plz advise/thx dmf

## 2018-05-16 NOTE — Telephone Encounter (Signed)
Rx refilled as requested and agree with need for OV with PCP in 06/2018

## 2018-05-22 ENCOUNTER — Other Ambulatory Visit: Payer: Self-pay | Admitting: Family Medicine

## 2018-06-05 ENCOUNTER — Encounter: Payer: Self-pay | Admitting: Family Medicine

## 2018-06-10 ENCOUNTER — Other Ambulatory Visit: Payer: Self-pay | Admitting: Family Medicine

## 2018-06-16 ENCOUNTER — Other Ambulatory Visit: Payer: Self-pay | Admitting: Family Medicine

## 2018-06-17 NOTE — Telephone Encounter (Signed)
Please advise 

## 2018-07-27 ENCOUNTER — Telehealth: Payer: Self-pay | Admitting: Family Medicine

## 2018-07-28 NOTE — Telephone Encounter (Signed)
Must schedule virtual visit for refill/thx dmf

## 2018-07-29 NOTE — Telephone Encounter (Signed)
I called patient to schedule appointment, patient declined appointment. Patient stated the day I gave her she will be back to work, patient will call back at another time to schedule.

## 2018-08-18 ENCOUNTER — Encounter: Payer: Self-pay | Admitting: Family Medicine

## 2018-08-21 ENCOUNTER — Telehealth (INDEPENDENT_AMBULATORY_CARE_PROVIDER_SITE_OTHER): Payer: BC Managed Care – PPO | Admitting: Family Medicine

## 2018-08-21 ENCOUNTER — Encounter: Payer: Self-pay | Admitting: Family Medicine

## 2018-08-21 DIAGNOSIS — E0822 Diabetes mellitus due to underlying condition with diabetic chronic kidney disease: Secondary | ICD-10-CM

## 2018-08-21 DIAGNOSIS — F4322 Adjustment disorder with anxiety: Secondary | ICD-10-CM

## 2018-08-21 DIAGNOSIS — L559 Sunburn, unspecified: Secondary | ICD-10-CM | POA: Diagnosis not present

## 2018-08-21 NOTE — Assessment & Plan Note (Signed)
Feels she needs to continue xanax twice daily with her wellbutrin for now but anticipates her anxiety will improve once they get the kinks out with school restarting. Call or send my chart message prn if these symptoms worsen or fail to improve as anticipated. The patient indicates understanding of these issues and agrees with the plan.

## 2018-08-21 NOTE — Assessment & Plan Note (Addendum)
Overdue for labs and she is willing to get them done if done at [facility] by her house.  Orders entered.  Continue current dose of Metformin. Orders Placed This Encounter  Procedures  . Hemoglobin A1c  . Lipid panel  . Comprehensive metabolic panel  . CBC with Differential/Platelet  . TSH

## 2018-08-21 NOTE — Assessment & Plan Note (Signed)
Difficult to see on video and her recent pictures did not upload.  Per her report- no blistering, no longer painful, now itching and peeling so it appears to be healing well. Aloe is all she is currently using for it.

## 2018-08-21 NOTE — Progress Notes (Signed)
Virtual Visit via Video   Due to the COVID-19 pandemic, this visit was completed with telemedicine (audio/video) technology to reduce patient and provider exposure as well as to preserve personal protective equipment.   I connected with Kristy Conrad by a video enabled telemedicine application and verified that I am speaking with the correct person using two identifiers. Location patient: Home Location provider: [facility], Office Persons participating in the virtual visit: Kristy Conrad, Kristy Mannanalia Ayane Delancey, MD   I discussed the limitations of evaluation and management by telemedicine and the availability of in person appointments. The patient expressed understanding and agreed to proceed.  Care Team   Patient Care Team: Dianne DunAron, Syniyah Bourne M, MD as PCP - General  Subjective:   HPI:   Follow up-  Last had virtual visit with pt on 04/14/18.  Note reviewed.  DM- Lab Results  Component Value Date   HGBA1C 8.2 (H) 09/03/2017   On Metformin 1000 mg twice daily.  She does not check her FSBS regularly.  She does feel she hasn't gained weight- pants feel loose.   Anxiety /depression- In April she felt she could no answer PHQ or GAD screening well given strange new situation working from home.  Work has been stressful but she had gone back to taking lower dose of xanax as (no more than 0.25 mg daily). Also taking Wellbutrin 300 mg XR daily. She felt xanax was not working as well since covid crisis started and she has had to work from home but she is adjusting.  She is also going back to graduate school.  We increased her xanax to 0.25 mg twice daily which she thinks has helped some but now that she is going to school and working, she is under stress especially since school is restarting.    She says she does feel less depressed- she has enjoyed the app house party with her friends. And she is going to the beach next week.  Sunburn on her thigh. She has sent pictures. Tx has been with an Aloe  plant.  She feels it is much better.  Not as painful.  Just itchy and she is trying not to scratch it.  Now barely warm to touch.  Not weeping.   Obesity- has not yet called weight loss clinic.    Review of Systems  Constitutional: Negative.   HENT: Negative.   Eyes: Negative.   Respiratory: Negative.   Cardiovascular: Negative.   Genitourinary: Negative.   Musculoskeletal: Negative.   Skin: Positive for itching and rash.  Neurological: Negative.   Endo/Heme/Allergies: Negative.   Psychiatric/Behavioral: Negative for depression. The patient is nervous/anxious and has insomnia.   All other systems reviewed and are negative.    Patient Active Problem List   Diagnosis Date Noted  . Chronic pain of right knee 03/27/2017  . Transient adjustment reaction with anxiety 05/02/2015  . Edema 01/19/2013  . OSA (obstructive sleep apnea) 09/15/2012  . Obesity, morbid, BMI 50 or higher (HCC) 11/12/2011  . 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 POLYCYSTIC OVARIES 07/08/2009  . DEPRESSION, MAJOR, RECURRENT, MODERATE 07/08/2009    Social History   Tobacco Use  . Smoking status: Never Smoker  . Smokeless tobacco: Never Used  Substance Use Topics  . Alcohol use: Yes    Comment: 4-5 drinks per week    Current Outpatient Medications:  .  ALPRAZolam (XANAX) 0.25 MG tablet, TAKE 1 TABLET BY MOUTH TWICE A DAY AS NEEDED FOR ANXIETY, Disp:  60 tablet, Rfl: 1 .  Biotin (BIOTIN 5000) 5 MG CAPS, Take by mouth., Disp: , Rfl:  .  buPROPion (WELLBUTRIN XL) 300 MG 24 hr tablet, TAKE ONE TABLET BY MOUTH DAILY, Disp: 90 tablet, Rfl: 1 .  cetirizine (ZYRTEC) 10 MG tablet, Take 10 mg by mouth daily., Disp: , Rfl:  .  folic acid (FOLVITE) 1 MG tablet, Take 1 mg by mouth daily., Disp: , Rfl:  .  Glucosamine HCl-MSM (GLUCOSAMINE-MSM PO), Take 1,500 mg by mouth daily., Disp: , Rfl:  .  lisinopril (ZESTRIL) 5 MG tablet, TAKE ONE TABLET BY MOUTH DAILY, Disp: 90 tablet, Rfl: 2 .   meloxicam (MOBIC) 15 MG tablet, TAKE ONE TABLET BY MOUTH DAILY WITH FOOD, Disp: 90 tablet, Rfl: 1 .  metFORMIN (GLUCOPHAGE) 1000 MG tablet, TAKE ONE TABLET BY MOUTH TWICE A DAY WITH MEALS, Disp: 180 tablet, Rfl: 0 .  phentermine (ADIPEX-P) 37.5 MG tablet, TAKE 1 TABLET (37.5 MG TOTAL) BY MOUTH EVERY MORNING., Disp: 30 tablet, Rfl: 0 .  Turmeric Curcumin 500 MG CAPS, Take 3 capsules by mouth daily. , Disp: , Rfl:   Allergies  Allergen Reactions  . Morphine     REACTION: vomiting    Objective:  Temp 98.7 F (37.1 C) (Oral)   VITALS: Per patient if applicable, see vitals. GENERAL: Alert, appears well and in no acute distress. HEENT: Atraumatic, conjunctiva clear, no obvious abnormalities on inspection of external nose and ears. NECK: Normal movements of the head and neck. CARDIOPULMONARY: No increased WOB. Speaking in clear sentences. I:E ratio WNL.  MS: Moves all visible extremities without noticeable abnormality. PSYCH: Pleasant and cooperative, well-groomed. Speech normal rate and rhythm. Affect is appropriate. Insight and judgement are appropriate. Attention is focused, linear, and appropriate.  NEURO: CN grossly intact. Oriented as arrived to appointment on time with no prompting. Moves both UE equally.  SKIN: No obvious lesions, wounds, erythema, or cyanosis noted on face or hands.  Depression screen [facility] 2/9 08/21/2018 03/25/2017  Decreased Interest 0 0  Down, Depressed, Hopeless 0 0  PHQ - 2 Score 0 0  Altered sleeping - 3  Tired, decreased energy - 1  Change in appetite - 0  Feeling bad or failure about yourself  - 0  Trouble concentrating - 1  Moving slowly or fidgety/restless - 2  Suicidal thoughts - 0  PHQ-9 Score - 7  Difficult doing work/chores - Not difficult at all    Assessment and Plan:   Kiondra was seen today for follow-up and sunburn.  Diagnoses and all orders for this visit:  Obesity, morbid, BMI 50 or higher ([place])  Diabetes mellitus due to underlying  condition with chronic kidney disease, without long-term current use of insulin, unspecified CKD stage (HCC)  Transient adjustment reaction with anxiety    . COVID-19 Education: The signs and symptoms of COVID-19 were discussed with the patient and how to seek care for testing if needed. The importance of social distancing was discussed today. . Reviewed expectations re: course of current medical issues. . Discussed self-management of symptoms. . Outlined signs and symptoms indicating need for more acute intervention. . Patient verbalized understanding and all questions were answered. Marland Kitchen Health Maintenance issues including appropriate healthy diet, exercise, and smoking avoidance were discussed with patient. . See orders for this visit as documented in the electronic medical record.  Arnette Norris, MD  Records requested if needed. Time spent: 25  minutes, of which >50% was spent in obtaining information about her symptoms, reviewing  her previous labs, evaluations, and treatments, counseling her about her condition (please see the discussed topics above), and developing a plan to further investigate it; she had a number of questions which I addressed.

## 2018-09-01 ENCOUNTER — Other Ambulatory Visit: Payer: Self-pay | Admitting: Family Medicine

## 2018-09-02 ENCOUNTER — Encounter: Payer: Self-pay | Admitting: Family Medicine

## 2018-10-07 ENCOUNTER — Other Ambulatory Visit: Payer: Self-pay | Admitting: Family Medicine

## 2018-10-08 ENCOUNTER — Other Ambulatory Visit: Payer: Self-pay | Admitting: Family Medicine

## 2018-10-20 ENCOUNTER — Other Ambulatory Visit: Payer: Self-pay | Admitting: Family Medicine

## 2018-10-21 NOTE — Telephone Encounter (Signed)
Last fill 09/21/18  #30/0 Last OV 08/21/18

## 2018-10-24 ENCOUNTER — Encounter: Payer: Self-pay | Admitting: Family Medicine

## 2018-11-13 ENCOUNTER — Encounter: Payer: Self-pay | Admitting: Family Medicine

## 2018-11-17 ENCOUNTER — Encounter: Payer: Self-pay | Admitting: Family Medicine

## 2018-11-17 ENCOUNTER — Other Ambulatory Visit: Payer: Self-pay

## 2018-11-17 MED ORDER — ALPRAZOLAM 0.25 MG PO TABS: ORAL_TABLET | ORAL | 1 refills | Status: DC

## 2018-11-17 NOTE — Telephone Encounter (Signed)
Medication request teed up in a refill request encounter.

## 2018-11-17 NOTE — Telephone Encounter (Signed)
Last ov 04/14/18 Last fill 09/03/18  #60/1

## 2018-12-08 ENCOUNTER — Other Ambulatory Visit: Payer: Self-pay

## 2018-12-08 MED ORDER — METFORMIN HCL 1000 MG PO TABS: ORAL_TABLET | ORAL | 0 refills | Status: DC

## 2018-12-16 ENCOUNTER — Encounter: Payer: Self-pay | Admitting: Family Medicine

## 2018-12-22 ENCOUNTER — Encounter: Payer: Self-pay | Admitting: Family Medicine

## 2019-01-02 ENCOUNTER — Other Ambulatory Visit: Payer: Self-pay | Admitting: Family Medicine

## 2019-01-05 NOTE — Telephone Encounter (Signed)
Last OV 08/21/18 Last Fill 12/08/18  #60/0

## 2019-01-06 ENCOUNTER — Other Ambulatory Visit: Payer: Self-pay | Admitting: Family Medicine

## 2019-01-07 LAB — LIPID PANEL
Chol/HDL Ratio: 3.4 ratio (ref 0.0–4.4)
Cholesterol, Total: 128 mg/dL (ref 100–199)
HDL: 38 mg/dL — ABNORMAL LOW (ref >39)
LDL Chol Calc (NIH): 70 mg/dL (ref 0–99)
Triglycerides: 108 mg/dL (ref 0–149)
VLDL Cholesterol Cal: 20 mg/dL (ref 5–40)

## 2019-01-07 LAB — COMPREHENSIVE METABOLIC PANEL
ALT: 34 IU/L — ABNORMAL HIGH (ref 0–32)
AST: 29 IU/L (ref 0–40)
Albumin/Globulin Ratio: 1.1 — ABNORMAL LOW (ref 1.2–2.2)
Albumin: 3.9 g/dL (ref 3.8–4.8)
Alkaline Phosphatase: 62 IU/L (ref 39–117)
BUN/Creatinine Ratio: 16 (ref 9–23)
BUN: 11 mg/dL (ref 6–24)
Bilirubin Total: 0.3 mg/dL (ref 0.0–1.2)
CO2: 23 mmol/L (ref 20–29)
Calcium: 9 mg/dL (ref 8.7–10.2)
Chloride: 100 mmol/L (ref 96–106)
Creatinine, Ser: 0.7 mg/dL (ref 0.57–1.00)
GFR calc Af Amer: 122 mL/min/{1.73_m2} (ref >59)
GFR calc non Af Amer: 106 mL/min/{1.73_m2} (ref >59)
Globulin, Total: 3.4 g/dL (ref 1.5–4.5)
Glucose: 244 mg/dL — ABNORMAL HIGH (ref 65–99)
Potassium: 4.5 mmol/L (ref 3.5–5.2)
Sodium: 136 mmol/L (ref 134–144)
Total Protein: 7.3 g/dL (ref 6.0–8.5)

## 2019-01-07 LAB — CBC WITH DIFFERENTIAL/PLATELET
Basophils Absolute: 0.1 10*3/uL (ref 0.0–0.2)
Basos: 1 %
EOS (ABSOLUTE): 0.2 10*3/uL (ref 0.0–0.4)
Eos: 3 %
Hematocrit: 36.8 % (ref 34.0–46.6)
Hemoglobin: 12.3 g/dL (ref 11.1–15.9)
Immature Grans (Abs): 0 10*3/uL (ref 0.0–0.1)
Immature Granulocytes: 0 %
Lymphocytes Absolute: 1.9 10*3/uL (ref 0.7–3.1)
Lymphs: 24 %
MCH: 28.2 pg (ref 26.6–33.0)
MCHC: 33.4 g/dL (ref 31.5–35.7)
MCV: 84 fL (ref 79–97)
Monocytes Absolute: 0.7 10*3/uL (ref 0.1–0.9)
Monocytes: 9 %
Neutrophils Absolute: 5.1 10*3/uL (ref 1.4–7.0)
Neutrophils: 63 %
Platelets: 332 10*3/uL (ref 150–450)
RBC: 4.36 x10E6/uL (ref 3.77–5.28)
RDW: 13.2 % (ref 11.7–15.4)
WBC: 7.9 10*3/uL (ref 3.4–10.8)

## 2019-01-07 LAB — HEMOGLOBIN A1C
Est. average glucose Bld gHb Est-mCnc: 212 mg/dL
Hgb A1c MFr Bld: 9 % — ABNORMAL HIGH (ref 4.8–5.6)

## 2019-01-07 LAB — TSH: TSH: 3.08 u[IU]/mL (ref 0.450–4.500)

## 2019-01-11 NOTE — Progress Notes (Signed)
Virtual Visit via Video   Due to the COVID-19 pandemic, this visit was completed with telemedicine (audio/video) technology to reduce patient and provider exposure as well as to preserve personal protective equipment.   I connected with Kristy Conrad by a video enabled telemedicine application and verified that I am speaking with the correct person using two identifiers. Location patient: Home Location provider: [facility], Office Persons participating in the virtual visit: Kristy Conrad, Arnette Norris, MD   I discussed the limitations of evaluation and management by telemedicine and the availability of in person appointments. The patient expressed understanding and agreed to proceed.  Interactive audio and video telecommunications were attempted between this provider and patient, however failed, due to patient having technical difficulties OR patient did not have access to video capability.  We continued and completed visit with audio only.   Care Team   Patient Care Team: Lucille Passy, MD as PCP - General  Subjective:   HPI:   Here for follow up of diabetes, obesity, anxiety/depression.  Review of Systems  Constitutional: Negative.  Negative for fever and malaise/fatigue.  HENT: Negative.  Negative for congestion and hearing loss.   Eyes: Negative.  Negative for blurred vision, discharge and redness.  Respiratory: Negative.  Negative for cough and shortness of breath.   Cardiovascular: Negative.  Negative for chest pain, palpitations and leg swelling.  Gastrointestinal: Negative.  Negative for abdominal pain and heartburn.  Genitourinary: Negative.  Negative for dysuria.  Musculoskeletal: Negative.  Negative for falls.  Skin: Negative.  Negative for rash.  Neurological: Negative.  Negative for loss of consciousness and headaches.  Endo/Heme/Allergies: Negative.  Does not bruise/bleed easily.  Psychiatric/Behavioral: Negative.  Negative for depression.  All other  systems reviewed and are negative.    Patient Active Problem List   Diagnosis Date Noted  . Sunburn 08/21/2018  . Chronic pain of right knee 03/27/2017  . Transient adjustment reaction with anxiety 05/02/2015  . Edema 01/19/2013  . OSA (obstructive sleep apnea) 09/15/2012  . Obesity, morbid, BMI 50 or higher ([place]) 11/12/2011  . Diabetes mellitus with renal complications ([place]) 70/62/3762  . POLYCYSTIC OVARIES 07/08/2009  . DEPRESSION, MAJOR, RECURRENT, MODERATE 07/08/2009    Social History   Tobacco Use  . Smoking status: Never Smoker  . Smokeless tobacco: Never Used  Substance Use Topics  . Alcohol use: Yes    Comment: 4-5 drinks per week    Current Outpatient Medications:  .  ALPRAZolam (XANAX) 0.25 MG tablet, TAKE 1 TABLET BY MOUTH TWICE A DAY AS NEEDED FOR ANXIETY, Disp: 60 tablet, Rfl: 1 .  Biotin (BIOTIN 5000) 5 MG CAPS, Take by mouth., Disp: , Rfl:  .  blood glucose meter kit and supplies, Dispense based on patient and insurance preference. Use up to four times daily as directed. (FOR ICD-10 E10.9, E11.9)., Disp: 1 each, Rfl: 3 .  buPROPion (WELLBUTRIN XL) 300 MG 24 hr tablet, Take 1 tablet (300 mg total) by mouth daily., Disp: 90 tablet, Rfl: 3 .  cetirizine (ZYRTEC) 10 MG tablet, Take 10 mg by mouth daily., Disp: , Rfl:  .  folic acid (FOLVITE) 1 MG tablet, Take 1 mg by mouth daily., Disp: , Rfl:  .  glipiZIDE (GLUCOTROL) 5 MG tablet, Take 1 tablet (5 mg total) by mouth 2 (two) times daily before a meal., Disp: 60 tablet, Rfl: 3 .  Glucosamine HCl-MSM (GLUCOSAMINE-MSM PO), Take 1,500 mg by mouth daily., Disp: , Rfl:  .  lisinopril (ZESTRIL)  5 MG tablet, TAKE ONE TABLET BY MOUTH DAILY, Disp: 90 tablet, Rfl: 3 .  meloxicam (MOBIC) 15 MG tablet, TAKE 1 TABLET BY MOUTH EVERY DAY WITH FOOD, Disp: 90 tablet, Rfl: 3 .  metFORMIN (GLUCOPHAGE) 1000 MG tablet, TAKE 1 TABLET BY MOUTH TWICE A DAY (PLZ SCHEDULE LAB VISIT THEN VIRTUAL VISIT FOR FUTURE FILLS), Disp: 60 tablet, Rfl: 0 .   phentermine (ADIPEX-P) 37.5 MG tablet, TAKE 1 TABLET (37.5 MG TOTAL) BY MOUTH EVERY MORNING., Disp: 30 tablet, Rfl: 0 .  Turmeric Curcumin 500 MG CAPS, Take 3 capsules by mouth daily. , Disp: , Rfl:   Allergies  Allergen Reactions  . Morphine     REACTION: vomiting    Objective:  There were no vitals taken for this visit.  VITALS: Per patient if applicable, see vitals. GENERAL: Alert, appears well and in no acute distress. HEENT: Atraumatic, conjunctiva clear, no obvious abnormalities on inspection of external nose and ears. NECK: Normal movements of the head and neck. CARDIOPULMONARY: No increased WOB. Speaking in clear sentences. I:E ratio WNL.  MS: Moves all visible extremities without noticeable abnormality. PSYCH: Pleasant and cooperative, well-groomed. Speech normal rate and rhythm. Affect is appropriate. Insight and judgement are appropriate. Attention is focused, linear, and appropriate.  NEURO: CN grossly intact. Oriented as arrived to appointment on time with no prompting. Moves both UE equally.  SKIN: No obvious lesions, wounds, erythema, or cyanosis noted on face or hands.  Depression screen [facility] 2/9 08/21/2018 03/25/2017  Decreased Interest 0 0  Down, Depressed, Hopeless 0 0  PHQ - 2 Score 0 0  Altered sleeping - 3  Tired, decreased energy - 1  Change in appetite - 0  Feeling bad or failure about yourself  - 0  Trouble concentrating - 1  Moving slowly or fidgety/restless - 2  Suicidal thoughts - 0  PHQ-9 Score - 7  Difficult doing work/chores - Not difficult at all     . COVID-19 Education: The signs and symptoms of COVID-19 were discussed with the patient and how to seek care for testing if needed. The importance of social distancing was discussed today. . Reviewed expectations re: course of current medical issues. . Discussed self-management of symptoms. . Outlined signs and symptoms indicating need for more acute intervention. . Patient verbalized understanding  and all questions were answered. Marland Kitchen Health Maintenance issues including appropriate healthy diet, exercise, and smoking avoidance were discussed with patient. . See orders for this visit as documented in the electronic medical record.  Arnette Norris, MD  Records requested if needed. Time spent: 40 minutes, of which >50% was spent in obtaining information about her symptoms, reviewing her previous labs, evaluations, and treatments, counseling her about her condition (please see the discussed topics above), and developing a plan to further investigate it; she had a number of questions which I addressed.   Lab Results  Component Value Date   WBC 7.9 01/06/2019   HGB 12.3 01/06/2019   HCT 36.8 01/06/2019   PLT 332 01/06/2019   GLUCOSE 244 (H) 01/06/2019   CHOL 128 01/06/2019   TRIG 108 01/06/2019   HDL 38 (L) 01/06/2019   LDLCALC 70 01/06/2019   ALT 34 (H) 01/06/2019   AST 29 01/06/2019   NA 136 01/06/2019   K 4.5 01/06/2019   CL 100 01/06/2019   CREATININE 0.70 01/06/2019   BUN 11 01/06/2019   CO2 23 01/06/2019   TSH 3.080 01/06/2019   HGBA1C 9.0 (H) 01/06/2019   MICROALBUR  1.9 03/31/2014    Lab Results  Component Value Date   TSH 3.080 01/06/2019   Lab Results  Component Value Date   WBC 7.9 01/06/2019   HGB 12.3 01/06/2019   HCT 36.8 01/06/2019   MCV 84 01/06/2019   PLT 332 01/06/2019   Lab Results  Component Value Date   NA 136 01/06/2019   K 4.5 01/06/2019   CO2 23 01/06/2019   GLUCOSE 244 (H) 01/06/2019   BUN 11 01/06/2019   CREATININE 0.70 01/06/2019   BILITOT 0.3 01/06/2019   ALKPHOS 62 01/06/2019   AST 29 01/06/2019   ALT 34 (H) 01/06/2019   PROT 7.3 01/06/2019   ALBUMIN 3.9 01/06/2019   CALCIUM 9.0 01/06/2019   GFR 92.26 09/03/2017   Lab Results  Component Value Date   CHOL 128 01/06/2019   Lab Results  Component Value Date   HDL 38 (L) 01/06/2019   Lab Results  Component Value Date   LDLCALC 70 01/06/2019   Lab Results  Component Value Date    TRIG 108 01/06/2019   Lab Results  Component Value Date   CHOLHDL 3.4 01/06/2019   Lab Results  Component Value Date   HGBA1C 9.0 (H) 01/06/2019       Assessment & Plan:   Problem List Items Addressed This Visit      Active Problems   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 Lab Results  Component Value Date   HGBA1C 9.0 (H) 01/06/2019   HGBA1C 8.2 (H) 09/03/2017   HGBA1C 7.6 03/25/2017   Lab Results  Component Value Date   MICROALBUR 1.9 03/31/2014   LDLCALC 70 01/06/2019   CREATININE 0.70 01/06/2019    Health Maintenance 1.  Patient is counseled on appropriate foot care. 2.  BP goal < 130/80. 3.  LDL goal of < 100, HDL > 40 and TG < 150. All diabetic should be on a statin unless contraindication. 4.  Eye Exam yearly and Dental Exam every 6 months. 5.  Dietary recommendations: < 100 g carbohydrates daily. 6.  Physical Activity recommendations: As tolerated aerobic and strength exercises.  7.  Appropriate vaccines reviewed.   Add glipizide 5 mg twice daily before meals, continue Metformin 1000 mg twice daily. Advised to check FSBS a few times weekly or when she feels hypoglycemic.  On ACEI.        Relevant Medications   glipiZIDE (GLUCOTROL) 5 MG tablet   metFORMIN (GLUCOPHAGE) 1000 MG tablet   lisinopril (ZESTRIL) 5 MG tablet   Transient adjustment reaction with anxiety    Assessment: Current symptoms:  Feels on edge, anxious, not concentrating as well, worrying a lot.  Frustrated with colleagues at work.  No current suicidal and homicidal ideation. Side effects from treatment: none.   In June, her anxiety was worsening so we increased her  xanax to 0.25 mg twice daily as needed and advised to continue Wellbutrin XL daily.  PDMP reviewed- no red flags.  Anxiety is still not great.  She cared for a sick relative, is in grad school and still working.  Plan: 1. Medications: Wellbutrin and Xanax. She would like to continue current rxs- no panic attacks. 2. Discussed potential  risks, expected benefits, possible side effects of the medicine. We also discussed how to take it correctly and dosing instructions.   3. If she has any significant side effects to the medicine, she is to stop it and call for advice. Instructed patient to contact office or on-call physician promptly should condition worsen or any new symptoms appear.           I have changed Kristy Conrad's buPROPion. I am also having her start on glipiZIDE. Additionally, I am having her maintain her folic acid, Turmeric Curcumin, cetirizine, Biotin, Glucosamine HCl-MSM (GLUCOSAMINE-MSM PO), phentermine, blood glucose meter kit and supplies, metFORMIN, meloxicam, lisinopril, and ALPRAZolam.  Meds ordered this encounter  Medications  . glipiZIDE (GLUCOTROL) 5 MG tablet    Sig: Take 1 tablet (5 mg total) by mouth 2 (two) times daily before a meal.    Dispense:  60 tablet    Refill:  3  . DISCONTD: blood glucose meter kit and supplies    Sig: Dispense based on patient and insurance preference. Use up to four times daily as directed. (FOR ICD-10 E10.9, E11.9).    Dispense:  1 each    Refill:  3    Order Specific Question:   Number of strips    Answer:   100    Order Specific Question:   Number of lancets    Answer:   100  . blood glucose meter kit and supplies    Sig: Dispense based on patient and insurance preference. Use up to four times daily as directed. (FOR ICD-10 E10.9, E11.9).    Dispense:  1 each    Refill:  3    Order Specific Question:   Number of strips    Answer:   100    Order Specific Question:   Number of lancets    Answer:   100  . metFORMIN  (GLUCOPHAGE) 1000 MG tablet    Sig: TAKE 1 TABLET BY MOUTH TWICE A DAY (PLZ SCHEDULE LAB VISIT THEN VIRTUAL VISIT FOR FUTURE FILLS)    Dispense:  60 tablet    Refill:  0  . meloxicam (MOBIC) 15 MG tablet    Sig: TAKE 1 TABLET BY MOUTH EVERY DAY WITH FOOD    Dispense:  90 tablet    Refill:  3  . lisinopril (ZESTRIL) 5 MG tablet    Sig: TAKE ONE TABLET BY MOUTH DAILY    Dispense:  90 tablet    Refill:  3  . ALPRAZolam (XANAX) 0.25 MG tablet    Sig: TAKE 1 TABLET BY MOUTH TWICE A DAY AS NEEDED FOR ANXIETY    Dispense:  60 tablet    Refill:  1    Not to exceed 4 additional fills before 12/14/2018  . buPROPion (WELLBUTRIN XL) 300 MG 24 hr tablet    Sig: Take 1 tablet (300 mg total) by mouth daily.    Dispense:  90 tablet    Refill:  3     Arnette Norris, MD

## 2019-01-12 ENCOUNTER — Encounter: Payer: Self-pay | Admitting: Family Medicine

## 2019-01-12 ENCOUNTER — Ambulatory Visit (INDEPENDENT_AMBULATORY_CARE_PROVIDER_SITE_OTHER): Payer: BC Managed Care – PPO | Admitting: Family Medicine

## 2019-01-12 DIAGNOSIS — F4322 Adjustment disorder with anxiety: Secondary | ICD-10-CM

## 2019-01-12 DIAGNOSIS — E0822 Diabetes mellitus due to underlying condition with diabetic chronic kidney disease: Secondary | ICD-10-CM

## 2019-01-12 DIAGNOSIS — E1129 Type 2 diabetes mellitus with other diabetic kidney complication: Secondary | ICD-10-CM

## 2019-01-12 MED ORDER — LISINOPRIL 5 MG PO TABS: ORAL_TABLET | ORAL | 3 refills | Status: DC

## 2019-01-12 MED ORDER — BLOOD GLUCOSE METER KIT: PACK | 3 refills | Status: DC

## 2019-01-12 MED ORDER — GLIPIZIDE 5 MG PO TABS: 5.0000 mg | ORAL_TABLET | Freq: Two times a day (BID) | ORAL | 3 refills | Status: DC

## 2019-01-12 MED ORDER — ALPRAZOLAM 0.25 MG PO TABS: ORAL_TABLET | ORAL | 1 refills | Status: DC

## 2019-01-12 MED ORDER — METFORMIN HCL 1000 MG PO TABS: ORAL_TABLET | ORAL | 0 refills | Status: DC

## 2019-01-12 MED ORDER — MELOXICAM 15 MG PO TABS: ORAL_TABLET | ORAL | 3 refills | Status: DC

## 2019-01-12 MED ORDER — BUPROPION HCL ER (XL) 300 MG PO TB24: 300.0000 mg | ORAL_TABLET | Freq: Every day | ORAL | 3 refills | Status: DC

## 2019-01-12 NOTE — Assessment & Plan Note (Signed)
Assessment: Current symptoms:  Feels on edge, anxious, not concentrating as well, worrying a lot.  Frustrated with colleagues at work.  No current suicidal and homicidal ideation. Side effects from treatment: none.   In June, her anxiety was worsening so we increased her xanax to 0.25 mg twice daily as needed and advised to continue Wellbutrin XL daily.  PDMP reviewed- no red flags.  Anxiety is still not great.  She cared for a sick relative, is in grad school and still working.  Plan: 1. Medications: Wellbutrin and Xanax. She would like to continue current rxs- no panic attacks. 2. Discussed potential risks, expected benefits, possible side effects of the medicine. We also discussed how to take it correctly and dosing instructions.   3. If she has any significant side effects to the medicine, she is to stop it and call for advice. Instructed patient to contact office or on-call physician promptly should condition worsen or any new symptoms appear.

## 2019-01-12 NOTE — Assessment & Plan Note (Signed)
Medication compliance: compliant most of the time, diabetic diet non  compliance: compliant most of the time, home glucose monitoring: does not check sugars at home, further diabetic ROS: no polyuria or polydipsia, no chest pain, dyspnea or TIA's, no numbness, tingling or pain in extremities  . Lab Results  Component Value Date   HGBA1C 9.0 (H) 01/06/2019   HGBA1C 8.2 (H) 09/03/2017   HGBA1C 7.6 03/25/2017   Lab Results  Component Value Date   MICROALBUR 1.9 03/31/2014   LDLCALC 70 01/06/2019   CREATININE 0.70 01/06/2019    Health Maintenance 1.  Patient is counseled on appropriate foot care. 2.  BP goal < 130/80. 3.  LDL goal of < 100, HDL > 40 and TG < 150. All diabetic should be on a statin unless contraindication. 4.  Eye Exam yearly and Dental Exam every 6 months. 5.  Dietary recommendations: < 100 g carbohydrates daily. 6.  Physical Activity recommendations: As tolerated aerobic and strength exercises.  7.  Appropriate vaccines reviewed.   Add glipizide 5 mg twice daily before meals, continue Metformin 1000 mg twice daily. Advised to check FSBS a few times weekly or when she feels hypoglycemic.  On ACEI.

## 2019-01-20 ENCOUNTER — Encounter: Payer: Self-pay | Admitting: Family Medicine

## 2019-01-21 NOTE — Telephone Encounter (Signed)
Please see message . Thank you .

## 2019-01-30 ENCOUNTER — Other Ambulatory Visit: Payer: Self-pay | Admitting: Family Medicine

## 2019-02-20 ENCOUNTER — Telehealth: Payer: Self-pay | Admitting: Family Medicine

## 2019-02-20 ENCOUNTER — Ambulatory Visit: Payer: BC Managed Care – PPO | Admitting: Family Medicine

## 2019-02-20 NOTE — Telephone Encounter (Signed)
Dr. Selena Batten is out of the office this week, will return Monday. If the patient prefers virtual over in person then that's fine. In person would be ideal given that she'll be new to Dr. Selena Batten.  When you do schedule her, please allot plenty of time for both the [facility] and her chief issue, she has a lot going on and will need extra time.

## 2019-02-20 NOTE — Telephone Encounter (Signed)
Patient called today to schedule Acute visit She stated that she has been trying to get in touch with the [place] office and can not get a hold of anyone there. She is needing to see a provider to discuss medical leave for her self for depression and what she should do.  Is a virtual acute visit okay for this.  Also explained to patient that a TOC visit will still be needed.  Please advise

## 2019-02-23 ENCOUNTER — Ambulatory Visit (INDEPENDENT_AMBULATORY_CARE_PROVIDER_SITE_OTHER): Payer: BC Managed Care – PPO | Admitting: Family Medicine

## 2019-02-23 ENCOUNTER — Encounter: Payer: Self-pay | Admitting: Family Medicine

## 2019-02-23 ENCOUNTER — Ambulatory Visit: Payer: BC Managed Care – PPO | Admitting: Family Medicine

## 2019-02-23 DIAGNOSIS — F331 Major depressive disorder, recurrent, moderate: Secondary | ICD-10-CM | POA: Diagnosis not present

## 2019-02-23 DIAGNOSIS — F411 Generalized anxiety disorder: Secondary | ICD-10-CM

## 2019-02-23 MED ORDER — FLUOXETINE HCL 20 MG PO CAPS: 20.0000 mg | ORAL_CAPSULE | Freq: Every day | ORAL | 1 refills | Status: DC

## 2019-02-23 MED ORDER — BUPROPION HCL ER (XL) 150 MG PO TB24: 150.0000 mg | ORAL_TABLET | Freq: Every day | ORAL | 0 refills | Status: DC

## 2019-02-23 NOTE — Assessment & Plan Note (Signed)
Severe depression which has worsened over the last year. Long discussion about changing medication to get better control over anxiety/depression. Pt will switch to Fluoxetine and taper Bupropion. Continue xanax for now, but discussed goal of limited and reducing to just prn dosage. Therapy referral. Discussed FMLA - pt feels she needs to time to focus on her health including exercise, diet, and mental health - she finds that when she exercises and eats right mental health in improved. Advised deferring time off for now, as changing medication and may improve if depression/anxiety are better managed. Will reassess at f/u appointment.

## 2019-02-23 NOTE — Progress Notes (Signed)
I connected with Kristy Conrad on 02/23/19 at 12:20 PM EST by video and verified that I am speaking with the correct person using two identifiers.   I discussed the limitations, risks, security and privacy concerns of performing an evaluation and management service by video and the availability of in person appointments. I also discussed with the patient that there may be a patient responsible charge related to this service. The patient expressed understanding and agreed to proceed.  Patient location: Home Provider Location: [place] Participants: Lesleigh Noe and Amisadai Jennette Bill   Subjective:     Kristy Conrad is a 44 y.o. female presenting for Depression (x2 months )     HPI  #Depression #Anxiety - diagnosed with depression since 04-25-98 when her parents died - symptoms worse over the last 1-2 months - but did have symptoms bad over the last 6-7 months - anxiety also worse - depression keeps her from doing things, and then the anxiety worsens - cycle of one causing the other  #Prior treatment - Has been on welbutrin x 10 years - does not remember what she was on 20 years ago - not currently in therapy   #Xanax - taking this twice a day since September -   Work - wanting to see a therapist - wanting to start exercise and weight loss regimen - feels better when she exercises  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   Physical Exam Constitutional:      Appearance: Normal appearance. She is obese. She is  not ill-appearing.  HENT:     Head: Normocephalic and atraumatic.     Right Ear: External ear normal.     Left Ear: External ear normal.  Eyes:     Conjunctiva/sclera: Conjunctivae normal.  Pulmonary:     Effort: Pulmonary effort is normal. No respiratory distress.  Neurological:     Mental Status: She is alert. Mental status is at baseline.  Psychiatric:        Mood and Affect: Mood normal.        Behavior: Behavior normal.        Thought Content: Thought content normal.        Judgment: Judgment normal.      Depression screen [facility] 2/9 02/23/2019 02/23/2019 08/21/2018  Decreased Interest 3 3 0  Down, Depressed, Hopeless 3 3 0  PHQ - 2 Score 6 6 0  Altered sleeping 3 - -  Tired, decreased energy 3 - -  Change in appetite 2 - -  Feeling bad or failure about yourself  3 - -  Trouble concentrating 3 - -  Moving slowly or fidgety/restless 1 - -  Suicidal thoughts 2 - -  PHQ-9 Score 23 - -  Difficult doing work/chores Extremely dIfficult - -           Assessment & Plan:   Problem List Items Addressed This Visit  Other   DEPRESSION, MAJOR, RECURRENT, MODERATE - Primary    Severe depression which has worsened over the last year. Long discussion about changing medication to get better control over anxiety/depression. Pt will switch to Fluoxetine and taper Bupropion. Continue xanax for now, but discussed goal of limited and reducing to just prn dosage. Therapy referral. Discussed FMLA - pt feels she needs to time to focus on her health including exercise, diet, and mental health - she finds that when she exercises and eats right mental health in improved. Advised deferring time off for now, as changing medication and may improve if depression/anxiety are better managed. Will reassess at f/u appointment.        Relevant Medications   FLUoxetine (PROZAC) 20 MG capsule   buPROPion (WELLBUTRIN XL) 150 MG 24 hr tablet   Generalized anxiety disorder   Relevant Medications    FLUoxetine (PROZAC) 20 MG capsule   buPROPion (WELLBUTRIN XL) 150 MG 24 hr tablet     Safety plan discussed for SI.   Return in about 4 weeks (around 03/23/2019).  Lynnda Child, MD

## 2019-02-23 NOTE — Patient Instructions (Addendum)
Today - Start 150 mg of Bupropion daily - Start 20 mg of Fluoxetine daily  After 2 week - stop the Bupropion   Schedule another visit in 4 weeks   You are going to start a new antidepressant medication.   One of the risks of this medication is increase in suicidal thoughts.   Your suicide Action plan is as follows:  1) Call Cindee Lame or another friends 2) Call the Suicide Hotline [phone] which is available 24 hours 3) Call the Clinic    The most common side effect is stomach upset. If this happens it means the medication is working. It should get better in 1-3 weeks.   Medication for depression and anxiety often takes 6-8 weeks to have a noticeable difference so stick with it. Also the best way for recovery is taking medication and seeing a therapist -- this is so important.    How to help anxiety and depression  1) Regular Exercise - walking, jogging, cycling, dancing, strength training - aiming for 150 minutes of exercise a week --> Yoga has been shown in research to reduce depression and anxiety -- with even just one hour long session per week  2)  Begin a Mindfulness/Meditation practice -- this can take a little as 3 minutes and is helpful for all kinds of mood issues -- You can find resources in books -- Or you can download apps like  ---- Headspace App  ---- Calm  ---- Insignt Timer ---- Stop, Breathe & Think  # With each of these Apps - you should decline the "start free trial" offer and as you search through the App should be able to access some of their free content. You can also chose to pay for the content if you find one that works well for you.   # Many of them also offer sleep specific content which may help with insomnia  3) Healthy Diet -- Avoid or decrease Caffeine -- Avoid or decrease Alcohol -- Drink plenty of water, have a balanced diet -- Avoid cigarettes and marijuana (as well as other recreational drugs)  4) Find a therapist  -- [facility] is one option. Call [phone] -- Or you can check out www.psychologytoday.com -- you can read bios of therapists and see if they accept insurance -- Check with your insurance to see if you have coverage and who may take your insurance

## 2019-02-25 ENCOUNTER — Telehealth: Payer: Self-pay | Admitting: Family Medicine

## 2019-02-25 NOTE — Telephone Encounter (Signed)
Called pt to schedule a 4 wk follow up per Dr. Selena Batten.

## 2019-03-20 ENCOUNTER — Other Ambulatory Visit: Payer: Self-pay | Admitting: Family Medicine

## 2019-03-20 DIAGNOSIS — F331 Major depressive disorder, recurrent, moderate: Secondary | ICD-10-CM

## 2019-03-20 DIAGNOSIS — F411 Generalized anxiety disorder: Secondary | ICD-10-CM

## 2019-03-20 NOTE — Telephone Encounter (Signed)
Received refill request from pharmacy asking for 90 day supply.

## 2019-03-20 NOTE — Telephone Encounter (Signed)
Refill request from the pharmacy for 90 day supply

## 2019-03-24 ENCOUNTER — Ambulatory Visit (INDEPENDENT_AMBULATORY_CARE_PROVIDER_SITE_OTHER): Payer: BC Managed Care – PPO | Admitting: Family Medicine

## 2019-03-24 ENCOUNTER — Encounter: Payer: Self-pay | Admitting: Family Medicine

## 2019-03-24 ENCOUNTER — Other Ambulatory Visit: Payer: Self-pay

## 2019-03-24 DIAGNOSIS — E1129 Type 2 diabetes mellitus with other diabetic kidney complication: Secondary | ICD-10-CM | POA: Diagnosis not present

## 2019-03-24 DIAGNOSIS — F411 Generalized anxiety disorder: Secondary | ICD-10-CM | POA: Diagnosis not present

## 2019-03-24 DIAGNOSIS — F331 Major depressive disorder, recurrent, moderate: Secondary | ICD-10-CM

## 2019-03-24 DIAGNOSIS — E0822 Diabetes mellitus due to underlying condition with diabetic chronic kidney disease: Secondary | ICD-10-CM

## 2019-03-24 NOTE — Assessment & Plan Note (Signed)
Last Hgb A1c elevated and medication started. Due for Hgb A1c in 1 month, but with job is unable to come to the office. Will try to get it through [facility] facility. Follow-up in office for DM as soon as able.

## 2019-03-24 NOTE — Progress Notes (Signed)
I connected with Kristy Conrad on 03/24/19 at  9:00 AM EDT by video and verified that I am speaking with the correct person using two identifiers.   I discussed the limitations, risks, security and privacy concerns of performing an evaluation and management service by video and the availability of in person appointments. I also discussed with the patient that there may be a patient responsible charge related to this service. The patient expressed understanding and agreed to proceed.  Patient location: Home Provider Location: [place] [facility] Participants: Kristy Conrad and Kristy Conrad   Subjective:     Kristy Conrad is a 44 y.o. 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 Stopping welbutrin and starting Fluoxetine. Advised reducing xanax use. Deferred FMLA request to see how the medication change goes  Social History   Tobacco Use  Smoking Status Never Smoker  Smokeless Tobacco Never Used        Objective:   BP Readings from Last 3 Encounters:  12/31/17 122/78  09/03/17 126/90  03/27/17 116/82   Wt Readings from Last 3 Encounters:  01/02/18 (!) 336 lb 12.8 oz (152.8 kg)  12/31/17 (!) 340 lb (154.2 kg)  09/03/17 (!) 338 lb (153.3 kg)    There were no vitals taken for this visit.     Physical Exam Constitutional:      Appearance: Normal appearance. She is not ill-appearing.  HENT:     Head: Normocephalic and atraumatic.     Right Ear: External ear normal.     Left Ear: External ear normal.  Eyes:     Conjunctiva/sclera: Conjunctivae normal.  Pulmonary:     Effort: Pulmonary effort is normal. No respiratory distress.  Neurological:     Mental Status: She is alert. Mental status is at baseline.  Psychiatric:        Mood and Affect: Mood normal.        Behavior: Behavior normal.        Thought Content: Thought content normal.        Judgment: Judgment normal.    Depression screen [facility] 2/9 03/24/2019 02/23/2019 02/23/2019 08/21/2018 03/25/2017  Decreased Interest 1 3 3  0 0  Down, Depressed, Hopeless 1 3 3  0 0  PHQ - 2 Score 2 6 6  0 0  Altered sleeping 1 3 - - 3  Tired, decreased energy 1 3 - - 1  Change in appetite 0 2 - - 0  Feeling bad or failure about yourself  1 3 - - 0  Trouble concentrating 2 3 - - 1  Moving slowly or fidgety/restless 0 1 - - 2  Suicidal thoughts 0 2 - - 0  PHQ-9 Score  7 23 - - 7  Difficult doing work/chores Not difficult at all Extremely dIfficult - - Not difficult at all           Assessment & Plan:   Problem List Items Addressed This Visit      Endocrine   Diabetes mellitus with renal complications ([place])    Last Hgb A1c elevated and medication started. Due for Hgb A1c in 1 month, but with job is unable to come to the office. Will try to get it through [facility] facility. Follow-up in office for DM as soon as able.       Relevant Orders   Hemoglobin A1c     Other   DEPRESSION, MAJOR, RECURRENT, MODERATE    Symptoms significantly better and she even noted that being back around people has been helping too. Continue Prozac at 20 mg.       Generalized anxiety disorder - Primary       No follow-ups on file.  Lesleigh Noe, MD

## 2019-03-24 NOTE — Assessment & Plan Note (Addendum)
Symptoms significantly better and she even noted that being back around people has been helping too. Continue Prozac at 20 mg.

## 2019-04-07 ENCOUNTER — Other Ambulatory Visit: Payer: Self-pay

## 2019-04-07 MED ORDER — GLIPIZIDE 5 MG PO TABS: 5.0000 mg | ORAL_TABLET | Freq: Two times a day (BID) | ORAL | 0 refills | Status: DC

## 2019-04-23 ENCOUNTER — Encounter: Payer: BC Managed Care – PPO | Admitting: Family Medicine

## 2019-06-16 ENCOUNTER — Encounter: Payer: Self-pay | Admitting: Family Medicine

## 2019-06-16 ENCOUNTER — Ambulatory Visit: Payer: BC Managed Care – PPO | Admitting: Family Medicine

## 2019-06-16 ENCOUNTER — Other Ambulatory Visit: Payer: Self-pay

## 2019-06-16 VITALS — BP 124/82 | HR 90 | Ht 64.0 in | Wt 346.0 lb

## 2019-06-16 DIAGNOSIS — M25561 Pain in right knee: Secondary | ICD-10-CM

## 2019-06-16 DIAGNOSIS — F411 Generalized anxiety disorder: Secondary | ICD-10-CM

## 2019-06-16 DIAGNOSIS — F331 Major depressive disorder, recurrent, moderate: Secondary | ICD-10-CM

## 2019-06-16 DIAGNOSIS — E0822 Diabetes mellitus due to underlying condition with diabetic chronic kidney disease: Secondary | ICD-10-CM

## 2019-06-16 DIAGNOSIS — G8929 Other chronic pain: Secondary | ICD-10-CM

## 2019-06-16 LAB — POCT GLYCOSYLATED HEMOGLOBIN (HGB A1C): Hemoglobin A1C: 8.8 % — AB (ref 4.0–5.6)

## 2019-06-16 MED ORDER — LISINOPRIL 5 MG PO TABS: ORAL_TABLET | ORAL | 3 refills | Status: DC

## 2019-06-16 NOTE — Assessment & Plan Note (Signed)
Stable on supplements and meloxicam. Discussed seeing if she could use voltaren gel prn and limit meloxicam to as needed. She will try. She is also working on weight loss to help with knee pain.

## 2019-06-16 NOTE — Assessment & Plan Note (Signed)
Taking medication daily. Hgb A1c elevated but lower than prior. She is motivated to make dietary changes and wants to avoid an additional medication. If needing 3rd agent may consider SGLT-2 for weight loss support.

## 2019-06-16 NOTE — Progress Notes (Signed)
Subjective:     Kristy Conrad is a 44 y.o. female presenting for Establish Care     HPI  #Diabetes Currently taking metformin (Glucophage, Riomet) and glipizide Using medications without difficulties: No Hypoglycemic episodes:No  Hyperglycemic episodes:No  Feet problems:No  Blood Sugars averaging: does not check Last HgbA1c:  Lab Results  Component Value Date   HGBA1C 8.8 (A) 06/16/2019   Does not follow a diabetic diet - "eating my feelings" is trying   Diabetes Health Maintenance Due:    Diabetes Health Maintenance Due  Topic Date Due  . OPHTHALMOLOGY EXAM  03/28/2018  . HEMOGLOBIN A1C  12/16/2019  . FOOT EXAM  06/15/2020    #Depression - will be losing job but overall good  #HTN - no ha, vision issues, sob, cp - does not check her bp at home - taking lisinopril  #osteoarthritis - taking turmeric, collagen, glucosamine - taking meloxicam as well  - knees and hands primarily location   Review of Systems   Social History   Tobacco Use  Smoking Status Never Smoker  Smokeless Tobacco Never Used        Objective:    BP Readings from Last 3 Encounters:  06/16/19 124/82  12/31/17 122/78  09/03/17 126/90   Wt Readings from Last 3 Encounters:  06/16/19 (!) 346 lb (156.9 kg)  01/02/18 (!) 336 lb 12.8 oz (152.8 kg)  12/31/17 (!) 340 lb (154.2 kg)    BP 124/82   Pulse 90   Ht 5\' 4"  (1.626 m)   Wt (!) 346 lb (156.9 kg)   LMP  (LMP Unknown)   SpO2 98%   BMI 59.39 kg/m    Physical Exam Constitutional:      General: She is not in acute distress.    Appearance: She is well-developed. She is not diaphoretic.  HENT:     Right Ear: External ear normal.     Left Ear: External ear normal.  Eyes:     Conjunctiva/sclera: Conjunctivae normal.  Cardiovascular:     Rate and Rhythm: Normal rate and regular rhythm.  Pulmonary:     Effort: Pulmonary effort is normal. No respiratory distress.     Breath sounds: Normal breath sounds. No wheezing.   Musculoskeletal:     Cervical back: Neck supple.  Skin:    General: Skin is warm and dry.     Capillary Refill: Capillary refill takes less than 2 seconds.  Neurological:     Mental Status: She is alert. Mental status is at baseline.  Psychiatric:        Mood and Affect: Mood normal.        Behavior: Behavior normal.    Diabetic Foot Exam - Simple   Simple Foot Form Diabetic Foot exam was performed with the following findings: Yes 06/16/2019  9:18 AM  Visual Inspection No deformities, no ulcerations, no other skin breakdown bilaterally: Yes Sensation Testing Intact to touch and monofilament testing bilaterally: Yes Pulse Check Posterior Tibialis and Dorsalis pulse intact bilaterally: Yes Comments           Assessment & Plan:   Problem List Items Addressed This Visit      Endocrine   Diabetes mellitus with renal complications ([facility])    Taking medication daily. Hgb A1c elevated but lower than prior. She is motivated to make dietary changes and wants to avoid an additional medication. If needing 3rd agent may consider SGLT-2 for weight loss support.       Relevant Medications  lisinopril (ZESTRIL) 5 MG tablet   Other Relevant Orders   POCT glycosylated hemoglobin (Hb A1C) (Completed)     Other   DEPRESSION, MAJOR, RECURRENT, MODERATE - Primary    Stable, however, recently found out she is losing her job for next year. Will reassess at follow-up. Has 2 cats which are helping. Cont prozac      Obesity, morbid, BMI 50 or higher ([place])    Discussed candidate for surgery, she has considered but does not have social support to help with post-op course at this time. She will continue to work on diet and continue regular water aerobics.       Chronic pain of right knee    Stable on supplements and meloxicam. Discussed seeing if she could use voltaren gel prn and limit meloxicam to as needed. She will try. She is also working on weight loss to help with knee pain.        Generalized anxiety disorder       Return in about 3 months (around 09/16/2019).  Lesleigh Noe, MD

## 2019-06-16 NOTE — Assessment & Plan Note (Signed)
Discussed candidate for surgery, she has considered but does not have social support to help with post-op course at this time. She will continue to work on diet and continue regular water aerobics.

## 2019-06-16 NOTE — Assessment & Plan Note (Addendum)
Stable, however, recently found out she is losing her job for next year. Will reassess at follow-up. Has 2 cats which are helping. Cont prozac

## 2019-06-16 NOTE — Patient Instructions (Addendum)
Voltaren Gel - topical NSAIDs which you could try instead of meloxicam   Diabetes - work on diet - continue to take medication - check out [email or web address].org

## 2019-06-17 NOTE — Addendum Note (Signed)
Addended by: Gweneth Dimitri R on: 06/17/2019 12:06 PM   Modules accepted: Orders

## 2019-07-02 ENCOUNTER — Other Ambulatory Visit: Payer: Self-pay

## 2019-07-02 ENCOUNTER — Ambulatory Visit (INDEPENDENT_AMBULATORY_CARE_PROVIDER_SITE_OTHER)
Admission: RE | Admit: 2019-07-02 | Discharge: 2019-07-02 | Disposition: A | Discharge status: Home / Self Care | Payer: Self-pay | Source: Ambulatory Visit | Attending: Family Medicine | Admitting: Family Medicine

## 2019-07-02 DIAGNOSIS — E0822 Diabetes mellitus due to underlying condition with diabetic chronic kidney disease: Secondary | ICD-10-CM

## 2019-07-07 ENCOUNTER — Other Ambulatory Visit: Payer: Self-pay | Admitting: Family Medicine

## 2019-07-30 ENCOUNTER — Other Ambulatory Visit: Payer: Self-pay | Admitting: *Deleted

## 2019-07-30 MED ORDER — METFORMIN HCL 1000 MG PO TABS: ORAL_TABLET | ORAL | 1 refills | Status: DC

## 2019-09-15 ENCOUNTER — Other Ambulatory Visit: Payer: Self-pay | Admitting: Family Medicine

## 2019-09-15 DIAGNOSIS — F331 Major depressive disorder, recurrent, moderate: Secondary | ICD-10-CM

## 2019-09-15 DIAGNOSIS — F411 Generalized anxiety disorder: Secondary | ICD-10-CM

## 2019-09-15 NOTE — Telephone Encounter (Signed)
Left message for Kristy Conrad that we got a refill request for her medication but Dr. Selena Batten had wanted to follow up with her in 3 months and there are no follow up appointment scheduled.  I ask that she call the office to schedule appointment with Dr. Selena Batten and then we can send in refill to get her to that appointment.

## 2019-09-16 ENCOUNTER — Other Ambulatory Visit: Payer: Self-pay | Admitting: Family Medicine

## 2019-09-21 ENCOUNTER — Ambulatory Visit: Payer: BC Managed Care – PPO | Admitting: Family Medicine

## 2019-09-21 ENCOUNTER — Encounter: Payer: Self-pay | Admitting: Family Medicine

## 2019-09-22 ENCOUNTER — Ambulatory Visit: Payer: BC Managed Care – PPO | Admitting: Family Medicine

## 2019-09-22 NOTE — Telephone Encounter (Signed)
Last office visit 06/16/2019 for Establish Care.  AVS states to follow up in follow up in 3 months.  Patient cancelled her appointments on 09/21/2019 and 09/22/2019 due to being in training.  No future appointments.  Patient states she will follow up in a couple days to reschedule.  Ok to refill?

## 2019-10-12 ENCOUNTER — Telehealth: Payer: Self-pay

## 2019-10-12 LAB — NOVEL CORONAVIRUS, NAA: SARS-CoV-2, NAA: POSITIVE

## 2019-10-12 NOTE — Telephone Encounter (Signed)
[facility] Night - Client TELEPHONE ADVICE RECORD [facility] Patient Name: Kristy Conrad Gender: Female DOB: 14-May-1975 Age: 44 Y 7 M 8 D Return Phone Number: [phone] (Primary) Address: City/State/Zip: [place] [address] Client [facility] Night - Client Client Site [facility] - Night Physician Gweneth Dimitri- MD Contact Type Call Who Is Calling Patient / Member / Family / Caregiver Call Type Triage / Clinical Relationship To Patient Self Return Phone Number [phone] (Primary) Chief Complaint Headache Reason for Call Request to Schedule Office Appointment Initial Comment Caller has sxs sinus pressure headaches and congestion and drainage, fever, coughing fits. Asks for Covid test and flu tests. Translation No Nurse Assessment Nurse: Doylene Canard, RN, Rinaldo Cloud Date/Time (Eastern Time): 10/12/2019 8:33:10 AM Confirm and document reason for call. If symptomatic, describe symptoms. ---Caller states she has sinus pressure, headache, congestion and drainage, fever, body aches and cough. Symptoms started on Friday. Does the patient have any new or worsening symptoms? ---Yes Will a triage be completed? ---Yes Related visit to physician within the last 2 weeks? ---No Does the PT have any chronic conditions? (i.e. diabetes, asthma, this includes High risk factors for pregnancy, etc.) ---Yes List chronic conditions. ---Diabetes Is the patient pregnant or possibly pregnant? (Ask all females between the ages of 7-55) ---No Is this a behavioral health or substance abuse call? ---No Guidelines Guideline Title Affirmed Question Affirmed Notes Nurse Date/Time (Eastern Time) COVID-19 - Diagnosed or Suspected COVID-19 Testing, questions about Doylene Canard, RNRinaldo Cloud 10/12/2019 8:35:47 AM Disp. Time Lamount Cohen Time) Disposition Final User 10/12/2019 8:40:43 AM Home Care Yes Conner, RN, Joaquin Courts Disagree/Comply  Comply PLEASE NOTE: All timestamps contained within this report are represented as [place] Standard Time. CONFIDENTIALTY NOTICE: This fax transmission is intended only for the addressee. It contains information that is legally privileged, confidential or otherwise protected from use or disclosure. If you are not the intended recipient, you are strictly prohibited from reviewing, disclosing, copying using or disseminating any of this information or taking any action in reliance on or regarding this information. If you have received this fax in error, please notify us immediately by telephone so that we can arrange for its return to [place]. Phone: [phone], Toll-Free: [phone], Fax: [phone] Page: 2 of 2 Call Id: [id number] Caller Understands Yes PreDisposition Did not know what to do Care Advice Given Per Guideline HOME CARE: * You should be able to treat this at home. NOTE TO TRIAGER - COVID-19 TESTING: COVID-19 - WHO NEEDS TESTING? * SYMPTOMS: All people who have symptoms of COVID-19 should get tested WITHIN 3 DAYS of becoming ill. COVID-19 - WHERE TO GO FOR TESTING? * Your doctor (or NP/PA) can order a COVID-19 test for you. * Many clinics, retail clinics (such as [facility], [facility]), and urgent care centers perform testing. * Testing is also available at some local and state public health departments. * Self-tests (such as [employer]bot BinaxNow) for use at home are now available in some drugstores (such as [facility], [facility]). CALL BACK IF: * You have more questions. CARE ADVICE given per COVID-19 - DIAGNOSED OR SUSPECTED (Adult) guideline. Comments User: Hassan Rowan, RN Date/Time Lamount Cohen Time): 10/12/2019 8:43:24 AM transferred to office line

## 2019-10-12 NOTE — Telephone Encounter (Signed)
Per appt notes pt already scheduled 10/13/19 at 12 noon with DR Selena Batten for video visit.

## 2019-10-13 ENCOUNTER — Other Ambulatory Visit: Payer: Self-pay

## 2019-10-13 ENCOUNTER — Encounter: Payer: Self-pay | Admitting: Family Medicine

## 2019-10-13 ENCOUNTER — Telehealth (INDEPENDENT_AMBULATORY_CARE_PROVIDER_SITE_OTHER): Payer: BC Managed Care – PPO | Admitting: Family Medicine

## 2019-10-13 ENCOUNTER — Other Ambulatory Visit: Payer: Self-pay | Admitting: Primary Care

## 2019-10-13 DIAGNOSIS — U071 COVID-19: Secondary | ICD-10-CM | POA: Insufficient documentation

## 2019-10-13 NOTE — Telephone Encounter (Signed)
See encounter from today.

## 2019-10-13 NOTE — Progress Notes (Signed)
    I connected with Kristy Conrad on 10/13/19 at 12:00 PM EDT by video and verified that I am speaking with the correct person using two identifiers.   I discussed the limitations, risks, security and privacy concerns of performing an evaluation and management service by video and the availability of in person appointments. I also discussed with the patient that there may be a patient responsible charge related to this service. The patient expressed understanding and agreed to proceed.  Patient location: Home Provider Location: [facility] [facility] Participants: Lynnda Child and Shirelle Zenon Mayo   Subjective:     Kristy Conrad is a 44 y.o. 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   Physical Exam Constitutional:      Appearance: Normal appearance. She is not ill-appearing.  HENT:     Head: Normocephalic and atraumatic.     Right Ear: External ear normal.     Left Ear: External ear normal.  Eyes:      Conjunctiva/sclera: Conjunctivae normal.  Pulmonary:     Effort: Pulmonary effort is normal. No respiratory distress.  Neurological:     Mental Status: She is alert. Mental status is at baseline.  Psychiatric:        Mood and Affect: Mood normal.        Behavior: Behavior normal.        Thought Content: Thought content normal.        Judgment: Judgment normal.            Assessment & Plan:   Problem List Items Addressed This Visit      Other   COVID-19 virus infection - Primary    Doing well at home. Previously vaccinated. Continue home treatment. Will refer for monoclonal antibody treatment (obesity and DM). ER and return precautions discussed.           Return if symptoms worsen or fail to improve.  Lynnda Child, MD

## 2019-10-13 NOTE — Assessment & Plan Note (Signed)
Doing well at home. Previously vaccinated. Continue home treatment. Will refer for monoclonal antibody treatment (obesity and DM). ER and return precautions discussed.

## 2019-10-13 NOTE — Progress Notes (Signed)
I connected by phone with Kristy Conrad on 10/13/2019 at 5:57 PM to discuss the potential use of a new treatment for mild to moderate COVID-19 viral infection in non-hospitalized patients.  This patient is a 44 y.o. female that meets the FDA criteria for Emergency Use Authorization of COVID monoclonal antibody casirivimab/imdevimab or bamlanivimab/eteseviamb.  Has a (+) direct SARS-CoV-2 viral test result  Has mild or moderate COVID-19   Is NOT hospitalized due to COVID-19  Is within 10 days of symptom onset  Has at least one of the high risk factor(s) for progression to severe COVID-19 and/or hospitalization as defined in EUA.  Specific high risk criteria : BMI > 25 and Diabetes   I have spoken and communicated the following to the patient or parent/caregiver regarding COVID monoclonal antibody treatment:  1. FDA has authorized the emergency use for the treatment of mild to moderate COVID-19 in adults and pediatric patients with positive results of direct SARS-CoV-2 viral testing who are 23 years of age and older weighing at least 40 kg, and who are at high risk for progressing to severe COVID-19 and/or hospitalization.  2. The significant known and potential risks and benefits of COVID monoclonal antibody, and the extent to which such potential risks and benefits are unknown.  3. Information on available alternative treatments and the risks and benefits of those alternatives, including clinical trials.  4. Patients treated with COVID monoclonal antibody should continue to self-isolate and use infection control measures (e.g., wear mask, isolate, social distance, avoid sharing personal items, clean and disinfect "high touch" surfaces, and frequent handwashing) according to CDC guidelines.   5. The patient or parent/caregiver has the option to accept or refuse COVID monoclonal antibody treatment.  After reviewing this information with the patient, the patient has agreed to receive one  of the available covid 19 monoclonal antibodies and will be provided an appropriate fact sheet prior to infusion. Doreene Nest, NP 10/13/2019 5:57 PM

## 2019-10-14 ENCOUNTER — Other Ambulatory Visit (HOSPITAL_COMMUNITY): Payer: Self-pay

## 2019-10-14 ENCOUNTER — Ambulatory Visit (HOSPITAL_COMMUNITY)
Admission: RE | Admit: 2019-10-14 | Discharge: 2019-10-14 | Disposition: A | Discharge status: Home / Self Care | Payer: BC Managed Care – PPO | Source: Ambulatory Visit | Attending: Pulmonary Disease | Admitting: Pulmonary Disease

## 2019-10-14 ENCOUNTER — Encounter: Payer: Self-pay | Admitting: Family Medicine

## 2019-10-14 DIAGNOSIS — U071 COVID-19: Secondary | ICD-10-CM | POA: Diagnosis present

## 2019-10-14 MED ORDER — DIPHENHYDRAMINE HCL 50 MG/ML IJ SOLN: 50.0000 mg | Freq: Once | INTRAMUSCULAR | Status: DC | PRN

## 2019-10-14 MED ORDER — ACETAMINOPHEN 325 MG PO TABS
650.0000 mg | ORAL_TABLET | Freq: Once | ORAL | Status: AC
  Administered 2019-10-14: 650 mg via ORAL
  Filled 2019-10-14: qty 2

## 2019-10-14 MED ORDER — SODIUM CHLORIDE 0.9 % IV SOLN: INTRAVENOUS | Status: DC | PRN

## 2019-10-14 MED ORDER — ONDANSETRON 4 MG PO TBDP: 4.0000 mg | ORAL_TABLET | Freq: Three times a day (TID) | ORAL | 0 refills | Status: DC | PRN

## 2019-10-14 MED ORDER — FAMOTIDINE IN NACL 20-0.9 MG/50ML-% IV SOLN: 20.0000 mg | Freq: Once | INTRAVENOUS | Status: DC | PRN

## 2019-10-14 MED ORDER — SODIUM CHLORIDE 0.9 % IV SOLN: Freq: Once | INTRAVENOUS | Status: AC

## 2019-10-14 MED ORDER — ALBUTEROL SULFATE HFA 108 (90 BASE) MCG/ACT IN AERS: 2.0000 | INHALATION_SPRAY | Freq: Once | RESPIRATORY_TRACT | Status: DC | PRN

## 2019-10-14 MED ORDER — EPINEPHRINE 0.3 MG/0.3ML IJ SOAJ: 0.3000 mg | Freq: Once | INTRAMUSCULAR | Status: DC | PRN

## 2019-10-14 MED ORDER — METHYLPREDNISOLONE SODIUM SUCC 125 MG IJ SOLR: 125.0000 mg | Freq: Once | INTRAMUSCULAR | Status: DC | PRN

## 2019-10-14 NOTE — Progress Notes (Signed)
  Diagnosis: COVID-19  Physician:Dr Wright  Procedure: Covid Infusion Clinic Med: bamlanivimab\etesevimab infusion - Provided patient with bamlanimivab\etesevimab fact sheet for patients, parents and caregivers prior to infusion.  Complications: No immediate complications noted.  Discharge: Discharged home   Jaleen Finch W 10/14/2019  

## 2019-10-14 NOTE — Discharge Instructions (Signed)

## 2019-10-23 ENCOUNTER — Telehealth: Payer: Self-pay

## 2019-10-23 NOTE — Telephone Encounter (Signed)
Left v/m requesting cb. 

## 2019-10-23 NOTE — Telephone Encounter (Signed)
Lupita Leash CMA left on my desk for pt to be triaged and it is OK for pt to wait until 10/26/19 to do video visit with Dr Patsy Lager due to + covid last wk and respiratory issues. I am unable to reach pt by phone and left v/m requesting cb. Murlean Caller [phone] on DPR the person that answered the phone said wrong #. I spoke with Denisha at front desk and she said the pt did not seem to have any distress with respiratory issues when appt was given. Sending note to Lupita Leash CMA and [facility] LPN.

## 2019-10-24 NOTE — Telephone Encounter (Signed)
Will defer to Dr Patsy Lager and care team regarding in-person vs virtual visit. however her symptoms started 10/09/2019

## 2019-10-26 ENCOUNTER — Encounter: Payer: Self-pay | Admitting: Family Medicine

## 2019-10-26 ENCOUNTER — Other Ambulatory Visit: Payer: Self-pay

## 2019-10-26 ENCOUNTER — Telehealth (INDEPENDENT_AMBULATORY_CARE_PROVIDER_SITE_OTHER): Payer: BC Managed Care – PPO | Admitting: Family Medicine

## 2019-10-26 VITALS — Ht 64.0 in

## 2019-10-26 DIAGNOSIS — R0600 Dyspnea, unspecified: Secondary | ICD-10-CM

## 2019-10-26 DIAGNOSIS — U071 COVID-19: Secondary | ICD-10-CM

## 2019-10-26 DIAGNOSIS — R0609 Other forms of dyspnea: Secondary | ICD-10-CM

## 2019-10-26 MED ORDER — ALBUTEROL SULFATE HFA 108 (90 BASE) MCG/ACT IN AERS: 2.0000 | INHALATION_SPRAY | Freq: Four times a day (QID) | RESPIRATORY_TRACT | 0 refills | Status: DC | PRN

## 2019-10-26 MED ORDER — PREDNISONE 20 MG PO TABS: ORAL_TABLET | ORAL | 0 refills | Status: DC

## 2019-10-26 NOTE — Telephone Encounter (Signed)
Still unable to reach pt by phone; pt has virtual appt today at 11:20 AM with Dr Patsy Lager. Note to [facility] CMA.

## 2019-10-26 NOTE — Progress Notes (Signed)
     Mattix Imhof T. Dusten Ellinwood, MD Primary Care and Sports Medicine [facility] [address] [place], [address] Phone: [phone]  FAX: [phone]  Kristy Conrad - 44 y.o. female  MRN [id number]  Date of Birth: April 17, 1975  Visit Date: 10/26/2019  PCP: Lynnda Child, MD  Referred by: Lynnda Child, MD Chief Complaint  Patient presents with  . Shortness of Breath    Covid + 10/13/2019/Infusion on 10/14/2019  . Torticollis   Virtual Visit via Video Note:  I connected with  [name] on 10/26/2019 11:20 AM EDT by a video enabled telemedicine application and verified that I am speaking with the correct person using two identifiers.   Location patient: home computer, tablet, or smartphone Location provider: work or home office Consent: Verbal consent directly obtained from [employer]. Persons participating in the virtual visit: patient, provider  I discussed the limitations of evaluation and management by telemedicine and the availability of in person appointments. The patient expressed understanding and agreed to proceed.  History of Present Illness:  She is a nice young lady, and she does have a known diagnosis of COVID-19.  She is had an IV immunoglobulin infusion done on October 14, 2019.  She is still having some persistent symptoms, predominantly shortness of breath.  She is getting short of breath even walking inside of her house.  Coronavirus risk factors include diabetes, morbid obesity.  She also has sleep apnea.  Review of Systems as above: See pertinent positives and pertinent negatives per HPI No acute distress verbally   Observations/Objective/Exam:  An attempt was made to discern vital signs over the phone and per patient if applicable and possible.   General:    Alert, Oriented, appears well and in no acute distress  Pulmonary:     On inspection no signs of respiratory distress.  Psych / Neurological:      Pleasant and cooperative.  Assessment and Plan:    ICD-10-CM   1. COVID-19 virus infection  U07.1   2. Dyspnea on exertion  R06.00    She is having some post Covid shortness of breath.  Place her on a dose of some steroids and give her an albuterol inhaler.  Stay out of work for 1 more additional week.  I discussed the assessment and treatment plan with the patient. The patient was provided an opportunity to ask questions and all were answered. The patient agreed with the plan and demonstrated an understanding of the instructions.   The patient was advised to call back or seek an in-person evaluation if the symptoms worsen or if the condition fails to improve as anticipated.  Follow-up: prn unless noted otherwise below No follow-ups on file.  Meds ordered this encounter  Medications  . predniSONE (DELTASONE) 20 MG tablet    Sig: 2 tabs po daily for 5 days, then 1 tab po daily for 5 days    Dispense:  15 tablet    Refill:  0  . albuterol (VENTOLIN HFA) 108 (90 Base) MCG/ACT inhaler    Sig: Inhale 2 puffs into the lungs every 6 (six) hours as needed for wheezing or shortness of breath.    Dispense:  8 g    Refill:  0   No orders of the defined types were placed in this encounter.   Signed,  Elpidio Galea. Georgiana Spillane, MD

## 2019-10-29 ENCOUNTER — Encounter: Payer: Self-pay | Admitting: Family Medicine

## 2019-10-30 NOTE — Telephone Encounter (Addendum)
Per Dr. Selena Batten, pt should be connected with post covid clinic. The phone number is [phone]. And if pt has difficulty getting in with the post covid clinic to f/u with Dr. Selena Batten.  Contacted pt and gave her the number to the post covid clinic. She will call them now. She inquired if she is ok to return to work. 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 Advised if she had difficulty getting through [employer] she will most likely have difficulty making it through an entire day of work. Advised to rest and keep well hydrated and nourished. Advised if anything changed to contact office or if any problem getting into post covid clinic. Advised of ER precautions. Pt verbalized understanding.

## 2019-11-03 ENCOUNTER — Ambulatory Visit (INDEPENDENT_AMBULATORY_CARE_PROVIDER_SITE_OTHER): Payer: BC Managed Care – PPO | Admitting: Nurse Practitioner

## 2019-11-03 VITALS — BP 136/82 | HR 87 | Temp 97.3°F | Ht 64.0 in | Wt 348.0 lb

## 2019-11-03 DIAGNOSIS — Z8616 Personal history of COVID-19: Secondary | ICD-10-CM | POA: Diagnosis not present

## 2019-11-03 DIAGNOSIS — R0602 Shortness of breath: Secondary | ICD-10-CM | POA: Insufficient documentation

## 2019-11-03 DIAGNOSIS — J9601 Acute respiratory failure with hypoxia: Secondary | ICD-10-CM | POA: Diagnosis not present

## 2019-11-03 MED ORDER — AZITHROMYCIN 250 MG PO TABS: ORAL_TABLET | ORAL | 0 refills | Status: DC

## 2019-11-03 NOTE — Assessment & Plan Note (Signed)
Shortness of breath:   Stay well hydrated  Stay active  Deep breathing exercises - 10 deep breaths every hour  May start vitamin C 2,000 mg daily, vitamin D3 2,000 IU daily, Zinc 220 mg daily, and Quercetin 500 mg twice daily  May take tylenol for fever or pain  May take mucinex twice daily  Will order chest x ray  Continue albuterol  Complete prednisone  Home PT hand out given  Patient walked in office today.  O2 sats dropped to 87% while ambulating short distance on room air.  O2 sats returned to above 93% when placed on 2 L nasal cannula.  Will order O2 to wear with exertion at 2 L nasal cannula in room air at rest.    Follow up:  Follow up in 2 weeks or sooner if needed - go to the ED with any worsening symptoms!

## 2019-11-03 NOTE — Patient Instructions (Addendum)
Covid 19 Shortness of breath:   Stay well hydrated  Stay active  Deep breathing exercises - 10 deep breaths every hour  May start vitamin C 2,000 mg daily, vitamin D3 2,000 IU daily, Zinc 220 mg daily, and Quercetin 500 mg twice daily  May take tylenol for fever or pain  May take mucinex twice daily  Will order chest x ray  Continue albuterol  Complete prednisone  Home PT hand out given  Patient walked in office today.  O2 sats dropped to 87% while ambulating short distance on room air.  O2 sats returned to above 93% when placed on 2 L nasal cannula.  Will order O2 to wear with exertion at 2 L nasal cannula in room air at rest.    Follow up:  Follow up in 2 weeks or sooner if needed - go to the ED with any worsening symptoms!

## 2019-11-03 NOTE — Progress Notes (Addendum)
@Patient  ID: Kristy Conrad, female    DOB: [birth year], 44 y.o.   MRN: [id number]  Chief Complaint  Patient presents with  . New Patient (Initial Visit)    COVID 10/12 Sx: congestion and SOB    Referring provider: Lesleigh Noe, MD   44 year old female with history of sleep apnea, diabetes, depression, and obesity.  HPI  Patient presents today for post COVID care clinic visit.  She was diagnosed with Covid on 10/13/2019.  She has recently been prescribed a round of prednisone and albuterol by PCP she is currently finishing prednisone taper now.  Patient has not had any imaging done.  Patient did receive monoclonal antibody infusion.  Patient states that she is extremely short of breath with minimal exertion.  She has been trying to stay active but could not make it to the grocery store without having to stop and sit down yesterday.  Patient has not been compliant with albuterol.  She does not want to take it because she does not want to get dependent on it.  We discussed that she does need it at this point and that she can take it twice a day for the next few days.. Denies f/c/s, n/v/d, hemoptysis, PND,  chest pain or edema.       Allergies  Allergen Reactions  . Morphine     REACTION: vomiting    Immunization History  Administered Date(s) Administered  . Influenza Split 10/27/2010, 11/12/2011, 11/06/2014  . Influenza Whole 10/12/2009  . Influenza,inj,Quad PF,6+ Mos 10/22/2012, 09/25/2013, 11/08/2018  . Influenza-Unspecified 11/18/2015, 10/23/2017  . PFIZER SARS-COV-2 Vaccination 05/08/2019, 06/09/2019  . PPD Test 10/27/2010  . Pneumococcal Polysaccharide-23 10/20/2009, 11/01/2015  . Td 07/07/2008    Past Medical History:  Diagnosis Date  . Depression   . Diabetes mellitus   . Obesity   . PCOS (polycystic ovarian syndrome)     Tobacco History: Social History   Tobacco Use  Smoking Status Never Smoker  Smokeless Tobacco Never Used   Counseling given: Not  Answered   Outpatient Encounter Medications as of 11/03/2019  Medication Sig  . albuterol (VENTOLIN HFA) 108 (90 Base) MCG/ACT inhaler Inhale 2 puffs into the lungs every 6 (six) hours as needed for wheezing or shortness of breath.  . blood glucose meter kit and supplies Dispense based on patient and insurance preference. Use up to four times daily as directed. (FOR ICD-10 E10.9, E11.9).  . cetirizine (ZYRTEC) 10 MG tablet Take 10 mg by mouth daily.  . COLLAGEN PO Take by mouth.  Marland Kitchen FLUoxetine (PROZAC) 20 MG capsule TAKE 1 CAPSULE BY MOUTH EVERY DAY  . folic acid (FOLVITE) 1 MG tablet Take 1 mg by mouth daily.  Marland Kitchen glipiZIDE (GLUCOTROL) 5 MG tablet TAKE 1 TABLET (5 MG TOTAL) BY MOUTH 2 (TWO) TIMES DAILY BEFORE A MEAL.  Marland Kitchen Glucosamine HCl-MSM (GLUCOSAMINE-MSM PO) Take 1,500 mg by mouth daily.  Marland Kitchen lisinopril (ZESTRIL) 5 MG tablet TAKE ONE TABLET BY MOUTH DAILY  . meloxicam (MOBIC) 15 MG tablet TAKE 1 TABLET BY MOUTH EVERY DAY WITH FOOD  . metFORMIN (GLUCOPHAGE) 1000 MG tablet TAKE 1 TABLET BY MOUTH TWICE A DAY  . predniSONE (DELTASONE) 20 MG tablet 2 tabs po daily for 5 days, then 1 tab po daily for 5 days  . Turmeric Curcumin 500 MG CAPS Take 3 capsules by mouth daily.   Marland Kitchen azithromycin (ZITHROMAX) 250 MG tablet Take 2 tablets (500 mg) on day 1, then take 1 tablet (250 mg) on days  2-5  . [DISCONTINUED] FLUoxetine (PROZAC) 20 MG capsule Take 1 capsule (20 mg total) by mouth daily.   No facility-administered encounter medications on file as of 11/03/2019.     Review of Systems  Review of Systems  Constitutional: Positive for fatigue.  HENT: Negative.   Respiratory: Positive for cough and shortness of breath.   Cardiovascular: Negative.  Negative for chest pain, palpitations and leg swelling.  Gastrointestinal: Negative.   Allergic/Immunologic: Negative.   Neurological: Negative.   Psychiatric/Behavioral: Negative.        Physical Exam  BP 136/82 (BP Location: Left Arm)   Pulse 87    Temp (!) 97.3 F (36.3 C)   Ht 5' 4"  (1.626 m)   Wt (!) 348 lb (157.9 kg)   SpO2 98%   BMI 59.73 kg/m   Wt Readings from Last 5 Encounters:  11/03/19 (!) 348 lb (157.9 kg)  06/16/19 (!) 346 lb (156.9 kg)  01/02/18 (!) 336 lb 12.8 oz (152.8 kg)  12/31/17 (!) 340 lb (154.2 kg)  09/03/17 (!) 338 lb (153.3 kg)     Physical Exam Vitals and nursing note reviewed.  Constitutional:      General: She is not in acute distress.    Appearance: She is well-developed.  Cardiovascular:     Rate and Rhythm: Normal rate and regular rhythm.  Pulmonary:     Effort: Pulmonary effort is normal.     Comments: Breath sounds diminished Neurological:     Mental Status: She is alert and oriented to person, place, and time.        Assessment & Plan:   History of COVID-19 Shortness of breath:   Stay well hydrated  Stay active  Deep breathing exercises - 10 deep breaths every hour  May start vitamin C 2,000 mg daily, vitamin D3 2,000 IU daily, Zinc 220 mg daily, and Quercetin 500 mg twice daily  May take tylenol for fever or pain  May take mucinex twice daily  Will order chest x ray  Continue albuterol  Complete prednisone  Home PT hand out given  Patient walked in office today.  O2 sats dropped to 87% while ambulating short distance on room air.  O2 sats returned to above 93% when placed on 2 L nasal cannula.  Will order O2 to wear with exertion at 2 L nasal cannula in room air at rest.    Follow up:  Follow up in 2 weeks or sooner if needed - go to the ED with any worsening symptoms!     Fenton Foy, NP 11/03/2019

## 2019-11-04 ENCOUNTER — Ambulatory Visit
Admission: RE | Admit: 2019-11-04 | Discharge: 2019-11-04 | Disposition: A | Discharge status: Home / Self Care | Payer: BC Managed Care – PPO | Source: Ambulatory Visit | Attending: Nurse Practitioner | Admitting: Nurse Practitioner

## 2019-11-04 ENCOUNTER — Other Ambulatory Visit: Payer: Self-pay

## 2019-11-06 ENCOUNTER — Telehealth: Payer: Self-pay | Admitting: Nurse Practitioner

## 2019-11-06 ENCOUNTER — Other Ambulatory Visit: Payer: Self-pay | Admitting: Nurse Practitioner

## 2019-11-06 MED ORDER — AZITHROMYCIN 250 MG PO TABS: ORAL_TABLET | ORAL | 0 refills | Status: AC

## 2019-11-06 NOTE — Telephone Encounter (Signed)
-----   Message from Ivonne Andrew, NP sent at 11/06/2019  8:25 AM EDT ----- Please call to let patient know that chest xray shows pneumonia. Please make sure that she did get her oxygen to use as needed. Be sure to do deep breathing exercises. I am going to send in a round of antibiotics. Please make sure patient does have follow up appointment scheduled with [place].

## 2019-11-06 NOTE — Telephone Encounter (Signed)
Patient notified of results, verbally understood.  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 [employer] will not allow her to pick up. Stated she will call them back.

## 2019-11-17 ENCOUNTER — Ambulatory Visit: Payer: BC Managed Care – PPO | Admitting: Nurse Practitioner

## 2019-11-17 VITALS — BP 128/78 | HR 95 | Temp 97.1°F | Ht 64.0 in | Wt 346.0 lb

## 2019-11-17 DIAGNOSIS — Z8616 Personal history of COVID-19: Secondary | ICD-10-CM

## 2019-11-17 DIAGNOSIS — J9601 Acute respiratory failure with hypoxia: Secondary | ICD-10-CM

## 2019-11-17 MED ORDER — PREDNISONE 20 MG PO TABS: 20.0000 mg | ORAL_TABLET | Freq: Every day | ORAL | 0 refills | Status: AC

## 2019-11-17 NOTE — Assessment & Plan Note (Signed)
Shortness of Breath:   Stay well hydrated  Stay active  Deep breathing exercises - 10 deep breaths every hour  May take tylenol for fever or pain  May take mucinex twice daily  Will need repeat chest x ray in 2 weeks  Continue albuterol  Will order prednisone  Home PT hand out given  Patient walked in office today.  O2 sats remained around 95% while ambulating on room air.  Keep close check on O2 sats at home.    Follow up:  Follow up in 2 weeks or sooner if needed - will need repeat chest xray

## 2019-11-17 NOTE — Progress Notes (Signed)
@Patient  ID: Kristy Conrad, female    DOB: 14-Jun-1975, 44 y.o.   MRN: [id number]  Chief Complaint  Patient presents with  . Follow-up    Feeling a bit better. Congestion is better, fewer headaches. Although she still feels like she has  no energy    Referring provider: Lesleigh Noe, MD   44 year old female with history of sleep apnea, diabetes, depression, and obesity.  HPI  Patient presents today for post COVID care clinic visit follow-up.  Patient was last seen here on 11/03/2019.  Her chest x-ray revealed pneumonia.  She has been treated with azithromycin and prednisone.  She states that she is much improved.  She was walked in the office during last visit and was ordered oxygen due to low O2 sats.  Patient states that she refused to let the DME company bring the oxygen out to the house.  She has not used any oxygen since last visit.  She does feel like her shortness of breath has improved.  Patient was walked in office today and O2 sats did remain above 95% for entire walk on room air.  Patient also reports that she is having fewer headaches.  She is try to stay active and stay compliant with deep breathing exercises. Denies f/c/s, n/v/d, hemoptysis, PND, chest pain or edema.       Allergies  Allergen Reactions  . Morphine     REACTION: vomiting    Immunization History  Administered Date(s) Administered  . Influenza Split 10/27/2010, 11/12/2011, 11/06/2014  . Influenza Whole 10/12/2009  . Influenza,inj,Quad PF,6+ Mos 10/22/2012, 09/25/2013, 11/08/2018  . Influenza-Unspecified 11/18/2015, 10/23/2017  . PFIZER SARS-COV-2 Vaccination 05/08/2019, 06/09/2019  . PPD Test 10/27/2010  . Pneumococcal Polysaccharide-23 10/20/2009, 11/01/2015  . Td 07/07/2008    Past Medical History:  Diagnosis Date  . Depression   . Diabetes mellitus   . Obesity   . PCOS (polycystic ovarian syndrome)     Tobacco History: Social History   Tobacco Use  Smoking Status Never Smoker   Smokeless Tobacco Never Used   Counseling given: Not Answered   Outpatient Encounter Medications as of 11/17/2019  Medication Sig  . albuterol (VENTOLIN HFA) 108 (90 Base) MCG/ACT inhaler Inhale 2 puffs into the lungs every 6 (six) hours as needed for wheezing or shortness of breath.  . blood glucose meter kit and supplies Dispense based on patient and insurance preference. Use up to four times daily as directed. (FOR ICD-10 E10.9, E11.9).  . cetirizine (ZYRTEC) 10 MG tablet Take 10 mg by mouth daily.  . COLLAGEN PO Take by mouth.  Marland Kitchen FLUoxetine (PROZAC) 20 MG capsule TAKE 1 CAPSULE BY MOUTH EVERY DAY  . folic acid (FOLVITE) 1 MG tablet Take 1 mg by mouth daily.  Marland Kitchen glipiZIDE (GLUCOTROL) 5 MG tablet TAKE 1 TABLET (5 MG TOTAL) BY MOUTH 2 (TWO) TIMES DAILY BEFORE A MEAL.  Marland Kitchen Glucosamine HCl-MSM (GLUCOSAMINE-MSM PO) Take 1,500 mg by mouth daily.  Marland Kitchen lisinopril (ZESTRIL) 5 MG tablet TAKE ONE TABLET BY MOUTH DAILY  . meloxicam (MOBIC) 15 MG tablet TAKE 1 TABLET BY MOUTH EVERY DAY WITH FOOD  . metFORMIN (GLUCOPHAGE) 1000 MG tablet TAKE 1 TABLET BY MOUTH TWICE A DAY  . Turmeric Curcumin 500 MG CAPS Take 3 capsules by mouth daily.   . predniSONE (DELTASONE) 20 MG tablet Take 1 tablet (20 mg total) by mouth daily with breakfast for 5 days.  . [DISCONTINUED] azithromycin (ZITHROMAX) 250 MG tablet Take 2 tablets (500 mg) on  day 1, then take 1 tablet (250 mg) on days 2-5  . [DISCONTINUED] FLUoxetine (PROZAC) 20 MG capsule Take 1 capsule (20 mg total) by mouth daily.  . [DISCONTINUED] predniSONE (DELTASONE) 20 MG tablet 2 tabs po daily for 5 days, then 1 tab po daily for 5 days   No facility-administered encounter medications on file as of 11/17/2019.     Review of Systems  Review of Systems  Constitutional: Positive for fatigue. Negative for fever.  HENT: Positive for congestion.   Respiratory: Positive for cough and shortness of breath.   Cardiovascular: Negative.  Negative for chest pain,  palpitations and leg swelling.  Gastrointestinal: Negative.   Allergic/Immunologic: Negative.   Neurological: Negative.   Psychiatric/Behavioral: Negative.        Physical Exam  BP 128/78 (BP Location: Left Arm)   Pulse 95   Temp (!) 97.1 F (36.2 C)   Ht 5' 4"  (1.626 m)   Wt (!) 346 lb 0.1 oz (156.9 kg)   SpO2 96%   BMI 59.39 kg/m   Wt Readings from Last 5 Encounters:  11/17/19 (!) 346 lb 0.1 oz (156.9 kg)  11/03/19 (!) 348 lb (157.9 kg)  06/16/19 (!) 346 lb (156.9 kg)  01/02/18 (!) 336 lb 12.8 oz (152.8 kg)  12/31/17 (!) 340 lb (154.2 kg)     Physical Exam Vitals and nursing note reviewed.  Constitutional:      General: She is not in acute distress.    Appearance: She is well-developed.  Cardiovascular:     Rate and Rhythm: Normal rate and regular rhythm.  Pulmonary:     Effort: Pulmonary effort is normal.     Breath sounds: Normal breath sounds.  Musculoskeletal:     Right lower leg: No edema.     Left lower leg: No edema.  Neurological:     Mental Status: She is alert and oriented to person, place, and time.  Psychiatric:        Mood and Affect: Mood normal.        Behavior: Behavior normal.      Imaging: DG Chest 2 View  Result Date: 11/05/2019 CLINICAL DATA:  History of COVID in shortness of breath. COVID diagnosis 10/13/2019. Persistent shortness of breath and chest congestion. EXAM: CHEST - 2 VIEW COMPARISON:  CT chest 07/02/2019 FINDINGS: The heart size and mediastinal contours are within normal limits. Vague patchy airspace opacities within bilateral lower lungs. No pulmonary edema. No pleural effusion. No pneumothorax. No acute osseous abnormality. Right upper quadrant surgical clips. IMPRESSION: Vague patchy airspace opacities within bilateral lower lungs could represent atelectasis versus resolving COVID-19 infection versus developing infection/inflammation. Followup PA and lateral chest X-ray is recommended in 3-4 weeks to ensure resolution.  Electronically Signed   By: Iven Finn M.D.   On: 11/05/2019 23:26     Assessment & Plan:   History of COVID-19 Shortness of Breath:   Stay well hydrated  Stay active  Deep breathing exercises - 10 deep breaths every hour  May take tylenol for fever or pain  May take mucinex twice daily  Will need repeat chest x ray in 2 weeks  Continue albuterol  Will order prednisone  Home PT hand out given  Patient walked in office today.  O2 sats remained around 95% while ambulating on room air.  Keep close check on O2 sats at home.    Follow up:  Follow up in 2 weeks or sooner if needed - will need repeat chest xray  Fenton Foy, NP 11/17/2019

## 2019-11-17 NOTE — Patient Instructions (Signed)
Covid 19 Shortness of Breath:   Stay well hydrated  Stay active  Deep breathing exercises - 10 deep breaths every hour  May take tylenol for fever or pain  May take mucinex twice daily  Will need repeat chest x ray in 2 weeks  Continue albuterol  Will order prednisone  Home PT hand out given  Patient walked in office today.  O2 sats remained around 95% while ambulating on room air.  Keep close check on O2 sats at home.    Follow up:  Follow up in 2 weeks or sooner if needed - will need repeat chest xray

## 2019-11-18 ENCOUNTER — Other Ambulatory Visit: Payer: Self-pay | Admitting: Family Medicine

## 2019-11-24 ENCOUNTER — Ambulatory Visit: Payer: BC Managed Care – PPO

## 2019-11-30 ENCOUNTER — Ambulatory Visit
Admission: RE | Admit: 2019-11-30 | Discharge: 2019-11-30 | Disposition: A | Discharge status: Home / Self Care | Payer: BC Managed Care – PPO | Source: Ambulatory Visit | Attending: Nurse Practitioner | Admitting: Nurse Practitioner

## 2019-11-30 ENCOUNTER — Other Ambulatory Visit: Payer: Self-pay

## 2019-11-30 DIAGNOSIS — Z8616 Personal history of COVID-19: Secondary | ICD-10-CM

## 2019-12-01 ENCOUNTER — Ambulatory Visit: Payer: BC Managed Care – PPO | Admitting: Nurse Practitioner

## 2019-12-01 VITALS — Temp 97.0°F | Ht 64.0 in | Wt 347.0 lb

## 2019-12-01 DIAGNOSIS — Z8616 Personal history of COVID-19: Secondary | ICD-10-CM

## 2019-12-01 DIAGNOSIS — R55 Syncope and collapse: Secondary | ICD-10-CM

## 2019-12-01 DIAGNOSIS — R0602 Shortness of breath: Secondary | ICD-10-CM

## 2019-12-01 DIAGNOSIS — R42 Dizziness and giddiness: Secondary | ICD-10-CM

## 2019-12-01 NOTE — Progress Notes (Signed)
@Patient  ID: Kristy Conrad, female    DOB: 05/18/1975, 44 y.o.   MRN: [id number]  Chief Complaint  Patient presents with   Follow-up    No Sx, Had a fall Sunday now having knee pain, upper arm pain    Referring provider: Lesleigh Noe, MD   44 year old female with history of sleep apnea, diabetes, depression, and obesity.  HPI  Patient presents today for post COVID care clinic visit.  She was diagnosed with Covid on 10/13/2019.  She has recently been prescribed a round of prednisone and albuterol by PCP she is currently finishing prednisone taper now.  Patient has not had any imaging done.  Patient did receive monoclonal antibody infusion.    Patient was last seen in our office on 11/03/2019.  Patient's chest x-ray did show pneumonia and patient was prescribed antibiotic.  She states that she is doing better at this time her chest x-ray on 1129 was clear.  She has been doing home physical therapy exercises.  She states that she did have an incident on Sunday where she fell and is unclear if she lost consciousness or not.  She lives at home alone.  She states that her knees have been hurting for the past couple days.  She does not have any bruising associated with the fall.  She has good range of motion.  We discussed that if the soreness does not get better in the next couple days that she can call back and we can order an x-ray on her knees.  Patient states that before the fall occurred she was cleaning out her drawers in her bedroom and was doing a lot of bending over and more work than usual.  We discussed that we will refer her to cardiology for a cardiac work-up.  Denies f/c/s, n/v/d, hemoptysis, PND, chest pain or edema.       Allergies  Allergen Reactions   Morphine     REACTION: vomiting    Immunization History  Administered Date(s) Administered   Influenza Split 10/27/2010, 11/12/2011, 11/06/2014   Influenza Whole 10/12/2009   Influenza,inj,Quad PF,6+ Mos 10/22/2012,  09/25/2013, 11/08/2018   Influenza-Unspecified 11/18/2015, 10/23/2017   PFIZER SARS-COV-2 Vaccination 05/08/2019, 06/09/2019   PPD Test 10/27/2010   Pneumococcal Polysaccharide-23 10/20/2009, 11/01/2015   Td 07/07/2008    Past Medical History:  Diagnosis Date   Depression    Diabetes mellitus    Obesity    PCOS (polycystic ovarian syndrome)     Tobacco History: Social History   Tobacco Use  Smoking Status Never Smoker  Smokeless Tobacco Never Used   Counseling given: Yes   Outpatient Encounter Medications as of 12/01/2019  Medication Sig   albuterol (VENTOLIN HFA) 108 (90 Base) MCG/ACT inhaler TAKE 2 PUFFS BY MOUTH EVERY 6 HOURS AS NEEDED FOR WHEEZE OR SHORTNESS OF BREATH   blood glucose meter kit and supplies Dispense based on patient and insurance preference. Use up to four times daily as directed. (FOR ICD-10 E10.9, E11.9).   cetirizine (ZYRTEC) 10 MG tablet Take 10 mg by mouth daily.   COLLAGEN PO Take by mouth.   FLUoxetine (PROZAC) 20 MG capsule TAKE 1 CAPSULE BY MOUTH EVERY DAY   folic acid (FOLVITE) 1 MG tablet Take 1 mg by mouth daily.   glipiZIDE (GLUCOTROL) 5 MG tablet TAKE 1 TABLET (5 MG TOTAL) BY MOUTH 2 (TWO) TIMES DAILY BEFORE A MEAL.   Glucosamine HCl-MSM (GLUCOSAMINE-MSM PO) Take 1,500 mg by mouth daily.   lisinopril (ZESTRIL) 5  MG tablet TAKE ONE TABLET BY MOUTH DAILY   meloxicam (MOBIC) 15 MG tablet TAKE 1 TABLET BY MOUTH EVERY DAY WITH FOOD   metFORMIN (GLUCOPHAGE) 1000 MG tablet TAKE 1 TABLET BY MOUTH TWICE A DAY   Turmeric Curcumin 500 MG CAPS Take 3 capsules by mouth daily.    [DISCONTINUED] FLUoxetine (PROZAC) 20 MG capsule Take 1 capsule (20 mg total) by mouth daily.   No facility-administered encounter medications on file as of 12/01/2019.     Review of Systems  Review of Systems  Constitutional: Positive for fatigue. Negative for fever.  HENT: Negative.   Respiratory: Positive for shortness of breath. Negative for  cough.   Cardiovascular: Negative.  Negative for chest pain, palpitations and leg swelling.  Gastrointestinal: Negative.   Allergic/Immunologic: Negative.   Neurological: Negative.   Psychiatric/Behavioral: Negative.        Physical Exam  Temp (!) 97 F (36.1 C)    Ht 5' 4"  (1.626 m)    Wt (!) 347 lb 0.1 oz (157.4 kg)    BMI 59.56 kg/m   Wt Readings from Last 5 Encounters:  12/01/19 (!) 347 lb 0.1 oz (157.4 kg)  11/17/19 (!) 346 lb 0.1 oz (156.9 kg)  11/03/19 (!) 348 lb (157.9 kg)  06/16/19 (!) 346 lb (156.9 kg)  01/02/18 (!) 336 lb 12.8 oz (152.8 kg)     Physical Exam Vitals and nursing note reviewed.  Constitutional:      General: She is not in acute distress.    Appearance: She is well-developed.  Cardiovascular:     Rate and Rhythm: Normal rate and regular rhythm.  Pulmonary:     Effort: Pulmonary effort is normal.     Breath sounds: Normal breath sounds.  Neurological:     Mental Status: She is alert and oriented to person, place, and time.       Imaging: DG Chest 2 View  Result Date: 11/30/2019 CLINICAL DATA:  Dyspnea on exertion, cough. EXAM: CHEST - 2 VIEW COMPARISON:  November 04, 2019. FINDINGS: The heart size and mediastinal contours are within normal limits. Both lungs are clear. No pneumothorax or pleural effusion is noted. The visualized skeletal structures are unremarkable. IMPRESSION: No active cardiopulmonary disease. Electronically Signed   By: Marijo Conception M.D.   On: 11/30/2019 09:13   DG Chest 2 View  Result Date: 11/05/2019 CLINICAL DATA:  History of COVID in shortness of breath. COVID diagnosis 10/13/2019. Persistent shortness of breath and chest congestion. EXAM: CHEST - 2 VIEW COMPARISON:  CT chest 07/02/2019 FINDINGS: The heart size and mediastinal contours are within normal limits. Vague patchy airspace opacities within bilateral lower lungs. No pulmonary edema. No pleural effusion. No pneumothorax. No acute osseous abnormality. Right  upper quadrant surgical clips. IMPRESSION: Vague patchy airspace opacities within bilateral lower lungs could represent atelectasis versus resolving COVID-19 infection versus developing infection/inflammation. Followup PA and lateral chest X-ray is recommended in 3-4 weeks to ensure resolution. Electronically Signed   By: Iven Finn M.D.   On: 11/05/2019 23:26     Assessment & Plan:   History of COVID-19 Stay well hydrated  Stay active  Deep breathing exercises  May start vitamin C 2,000 mg daily, vitamin D3 2,000 IU daily, Zinc 220 mg daily, and Quercetin 500 mg twice daily  May take tylenol or fever or pain  May take mucinex twice daily   ? Syncopal episode Shortness of Breath:  Will place referral to cardiology   Follow up:  Follow  up in 1 month      Fenton Foy, NP 12/03/2019

## 2019-12-01 NOTE — Patient Instructions (Signed)
Covid 19:   Stay well hydrated  Stay active  Deep breathing exercises  May start vitamin C 2,000 mg daily, vitamin D3 2,000 IU daily, Zinc 220 mg daily, and Quercetin 500 mg twice daily  May take tylenol or fever or pain  May take mucinex twice daily   ? Syncopal episode Shortness of Breath:  Will place referral to cardiology   Follow up:  Follow up in 1 month

## 2019-12-03 DIAGNOSIS — R55 Syncope and collapse: Secondary | ICD-10-CM | POA: Insufficient documentation

## 2019-12-03 DIAGNOSIS — R42 Dizziness and giddiness: Secondary | ICD-10-CM | POA: Insufficient documentation

## 2019-12-03 NOTE — Assessment & Plan Note (Signed)
Stay well hydrated  Stay active  Deep breathing exercises  May start vitamin C 2,000 mg daily, vitamin D3 2,000 IU daily, Zinc 220 mg daily, and Quercetin 500 mg twice daily  May take tylenol or fever or pain  May take mucinex twice daily   ? Syncopal episode Shortness of Breath:  Will place referral to cardiology   Follow up:  Follow up in 1 month

## 2019-12-04 ENCOUNTER — Encounter: Payer: Self-pay | Admitting: Nurse Practitioner

## 2019-12-08 NOTE — Progress Notes (Signed)
Cardiology Office Note:   Date:  12/11/2019  NAME:  Kristy Conrad    MRN: [id number] DOB:  11-Feb-1975   PCP:  Lesleigh Noe, MD  Cardiologist:  No primary care provider on file.   Referring MD: Fenton Foy, NP   Chief Complaint  Patient presents with   New Patient (Initial Visit)        Dizziness        Edema    Legs and feet.   Shortness of Breath   History of Present Illness:   Kristy Conrad is a 44 y.o. female with a hx of morbid obesity, DM, HLD who is being seen today for the evaluation of shortness of breath at the request of Fenton Foy, NP. CT coronary calcium 0.  She reports she had coronavirus pneumonia in October 2021.  Since that time she has been short of breath.  She actually did have pneumonia on chest x-ray and her chest x-rays have improved.  She reports her shortness of breath is not improved.  She is morbidly obese with a BMI of 59.  She reports that she does get short of breath at times but it is more profound these days.  This is occurred since her coronavirus diagnosis.  She also reports on 11/29/2019 she had an episode where she fell down.  She reports she did not feel that she blacked out but just fell to the ground.  She felt weak in her knees.  She is not had a CT PE study.  We did discuss that pulmonary embolisms are very common after coronavirus pneumonia.  She does need evaluation for this.  She reports no increased lower extremity edema.  Weights have been stable.  CVD risk factors include morbid obesity and diabetes.  She is not on a statin but should be.  She denies any chest pain or pressure.  When she does get short of breath her heart does race.  She reports her heart rate increases.  She does report a family history of heart disease in her father.  her EKG today demonstrates normal sinus rhythm.  She has poor R wave progression due to obesity.  Blood pressure is 118/82.  She is not on a statin.  Most recent lipid profile shows a total  cholesterol 128, HDL 38, LDL 70, triglycerides 108.  Most recent A1c is 8.8.  No recent thyroid studies.  She should be on a statin due to her diabetes.  We did discuss this in the office today.  She is not depressed.  She does work as a Print production planner.  She is contemplating weight loss surgery in the past.  She also has sleep apnea that is not treated.  She was given a machine but has not been able to tolerate it.  I have expressed that she needs to have this reevaluated at some point.  Problem List 1. Obesity -347 lbs -BMI 59 2. DM -A1c 8.8 3. HLD -T chol 128, HDL 38, LDL 70, TG 108 4. OSA  Past Medical History: Past Medical History:  Diagnosis Date   Depression    Diabetes mellitus    Hyperlipidemia    Hypertension    Obesity    PCOS (polycystic ovarian syndrome)     Past Surgical History: Past Surgical History:  Procedure Laterality Date   BREAST SURGERY     breast reduction   CHOLECYSTECTOMY     WISDOM TOOTH EXTRACTION      Current Medications:  Current Meds  Medication Sig   albuterol (VENTOLIN HFA) 108 (90 Base) MCG/ACT inhaler TAKE 2 PUFFS BY MOUTH EVERY 6 HOURS AS NEEDED FOR WHEEZE OR SHORTNESS OF BREATH   blood glucose meter kit and supplies Dispense based on patient and insurance preference. Use up to four times daily as directed. (FOR ICD-10 E10.9, E11.9).   cetirizine (ZYRTEC) 10 MG tablet Take 10 mg by mouth daily.   Cholecalciferol (VITAMIN D3) 50 MCG (2000 UT) TABS Take 50 mcg by mouth daily.   COLLAGEN PO Take by mouth.   FLUoxetine (PROZAC) 20 MG capsule TAKE 1 CAPSULE BY MOUTH EVERY DAY   folic acid (FOLVITE) 1 MG tablet Take 1 mg by mouth daily.   glipiZIDE (GLUCOTROL) 5 MG tablet TAKE 1 TABLET (5 MG TOTAL) BY MOUTH 2 (TWO) TIMES DAILY BEFORE A MEAL.   Glucosamine HCl-MSM (GLUCOSAMINE-MSM PO) Take 1,500 mg by mouth daily.   lisinopril (ZESTRIL) 5 MG tablet TAKE ONE TABLET BY MOUTH DAILY   meloxicam (MOBIC) 15 MG tablet TAKE 1  TABLET BY MOUTH EVERY DAY WITH FOOD   metFORMIN (GLUCOPHAGE) 1000 MG tablet TAKE 1 TABLET BY MOUTH TWICE A DAY   Turmeric Curcumin 500 MG CAPS Take 3 capsules by mouth daily.      Allergies:    Morphine   Social History: Social History   Socioeconomic History   Marital status: Single    Spouse name: Not on file   Number of children: 0   Years of education: masters in education   Highest education level: Not on file  Occupational History   Occupation: Product manager: [employer]  Tobacco Use   Smoking status: Never Smoker   Smokeless tobacco: Never Used  Substance and Sexual Activity   Alcohol use: Yes    Comment: 1-2 times per month   Drug use: No   Sexual activity: Not Currently    Partners: Male  Other Topics Concern   Not on file  Social History Narrative   Regular exercise: no   Caffeine use: daily, tea   06/16/19   From: 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   Diet: not good, does not follow diabetic diet      Safety   Seat belts: Yes    Guns: No   Safe in relationships: Yes    Social Determinants of Radio broadcast assistant Strain: Not on file  Food Insecurity: Not on file  Transportation Needs: Not on file  Physical Activity: Not on file  Stress: Not on file  Social Connections: Not on file     Family History: The patient's family history includes Cancer in her paternal grandfather; Cancer (age of onset: 43) in her mother; Emphysema in her maternal grandfather and paternal grandfather; Heart attack (age of onset: 73) in her father; Heart disease in her maternal grandmother; Lung cancer in her  maternal grandfather.  ROS:   All other ROS reviewed and negative. Pertinent positives noted in the HPI.     EKGs/Labs/Other Studies Reviewed:   The following studies were personally  reviewed by me today:  EKG:  EKG is ordered today.  The ekg ordered today demonstrates normal sinus rhythm, heart rate 97, poor R wave progression noted which is likely obesity related, and was personally reviewed by me.   Recent Labs: 01/06/2019: ALT 34; BUN 11; Creatinine, Ser 0.70; Hemoglobin 12.3; Platelets 332; Potassium 4.5; Sodium 136; TSH 3.080   Recent Lipid Panel    Component Value Date/Time   CHOL 128 01/06/2019 1031   TRIG 108 01/06/2019 1031   HDL 38 (L) 01/06/2019 1031   CHOLHDL 3.4 01/06/2019 1031   CHOLHDL 3 09/03/2017 1304   VLDL 20.6 09/03/2017 1304   LDLCALC 70 01/06/2019 1031    Physical Exam:   VS:  BP 118/82 (BP Location: Left Arm, Patient Position: Sitting, Cuff Size: Large)    Pulse 97    Ht 5' 4"  (1.626 m)    Wt (!) 343 lb (155.6 kg)    BMI 58.88 kg/m    Wt Readings from Last 3 Encounters:  12/11/19 (!) 343 lb (155.6 kg)  12/01/19 (!) 347 lb 0.1 oz (157.4 kg)  11/17/19 (!) 346 lb 0.1 oz (156.9 kg)    General: Well nourished, well developed, in no acute distress Head: Atraumatic, normal size  Eyes: PEERLA, EOMI  Neck: Supple, no JVD Endocrine: No thryomegaly Cardiac: Normal S1, S2; RRR; no murmurs, rubs, or gallops Lungs: Clear to auscultation bilaterally, no wheezing, rhonchi or rales  Abd: Soft, nontender, no hepatomegaly  Ext: Trace edema Musculoskeletal: No deformities, BUE and BLE strength normal and equal Skin: Warm and dry, no rashes   Neuro: Alert and oriented to person, place, time, and situation, CNII-XII grossly intact, no focal deficits  Psych: Normal mood and affect   ASSESSMENT:   KEISY STRICKLER is a 44 y.o. female who presents for the following: 1. Postural dizziness with presyncope   2. SOB (shortness of breath) on exertion   3. Mixed  hyperlipidemia   4. Precordial pain   5. Obesity, morbid, BMI 50 or higher ([place])     PLAN:   1. Postural dizziness with presyncope 2. SOB (shortness of breath) on exertion -She said worsening shortness of breath since COVID-19 pneumonia.  Symptoms have not improved.  She is on inhalers.  She needs a CT PE study.  I think she is high risk for pulmonary embolism.  Given her obesity as well as Covid this could be a possibility.  I would also like to obtain a BNP, TSH.  We need to exclude congestive heart failure.  She also needs an echocardiogram.  She does not appear to have any heart failure examination today we will need to make sure there is no issues with her heart given her COVID-19 diagnosis. -I also have concerns for untreated sleep apnea.  She is not using her machine.  I offered her to see a sleep specialist.  She will contemplate this.  She also should consider weight loss surgery.  She has not made that commitment yet.  3. Mixed hyperlipidemia -She has been on a statin.  She is diabetic.  We will start Crestor 10 mg a day.  Her most recent LDL is 70.  4.  Obesity, BMI greater than 50 -We counseled her extensively on weight loss.  She needs to lose weight.  She is contemplating weight loss surgery in the past.  I think this would be a good idea to revisit.  She will let us know when she would like a referral to  healthy weight and wellness.   Disposition: Return in about 3 months (around 03/10/2020).  Medication Adjustments/Labs and Tests Ordered: Current medicines are reviewed at length with the patient today.  Concerns regarding medicines are outlined above.  Orders Placed This Encounter  Procedures   CT ANGIO CHEST PE W OR WO CONTRAST   Pro b natriuretic peptide (BNP)[facility])   TSH   Basic Metabolic Panel (BMET)   EKG 12-Lead   ECHOCARDIOGRAM COMPLETE   Meds ordered this encounter  Medications   rosuvastatin (CRESTOR) 10 MG tablet    Sig:  Take 1 tablet (10 mg total) by mouth daily.    Dispense:  90 tablet    Refill:  3    Patient Instructions  Medication Instructions:   START ROSUVASTATIN 10 MG ONCE DAILY  *If you need a refill on your cardiac medications before your next appointment, please call your pharmacy*   Lab Work:  Your physician recommends that you HAVE LAB WORK TODAY  If you have labs (blood work) drawn today and your tests are completely normal, you will receive your results only by:  [place] (if you have MyChart) OR  A paper copy in the mail If you have any lab test that is abnormal or we need to change your treatment, we will call you to review the results.   Testing/Procedures:  Your physician has requested that you have an echocardiogram. Echocardiography is a painless test that uses sound waves to create images of your heart. It provides your doctor with information about the size and shape of your heart and how well your hearts chambers and valves are working. This procedure takes approximately one hour. There are no restrictions for this procedure.[place] TO R/O PE ASAP     Follow-Up: At [facility], you and your health needs are our priority.  As part of our continuing mission to provide you with exceptional heart care, we have created designated Provider Care Teams.  These Care Teams include your primary Cardiologist (physician) and Advanced Practice Providers (APPs -  Physician Assistants and Nurse Practitioners) who all work together to provide you with the care you need, when you need it.  We recommend signing up for the patient portal called "MyChart".  Sign up information is provided on this After Visit Summary.  MyChart is used to connect with patients for Virtual Visits (Telemedicine).  Patients are able to view lab/test results, encounter notes, upcoming appointments, etc.  Non-urgent messages can be sent to your provider as well.   To  learn more about what you can do with MyChart, go to [email or web address].    Your next appointment:   3 month(s)  The format for your next appointment:   In Person  Provider:   Eleonore Chiquito, MD        Signed, Addison Naegeli. Audie Box, [place]  [address], [place], [place] [address] [phone]  12/11/2019 1:33 PM

## 2019-12-11 ENCOUNTER — Encounter: Payer: Self-pay | Admitting: Cardiovascular Disease

## 2019-12-11 ENCOUNTER — Ambulatory Visit (INDEPENDENT_AMBULATORY_CARE_PROVIDER_SITE_OTHER)
Admission: RE | Admit: 2019-12-11 | Discharge: 2019-12-11 | Disposition: A | Discharge status: Home / Self Care | Payer: BC Managed Care – PPO | Source: Ambulatory Visit | Attending: Cardiovascular Disease | Admitting: Cardiovascular Disease

## 2019-12-11 ENCOUNTER — Other Ambulatory Visit: Payer: Self-pay

## 2019-12-11 ENCOUNTER — Telehealth: Payer: Self-pay | Admitting: Cardiovascular Disease

## 2019-12-11 ENCOUNTER — Ambulatory Visit: Payer: BC Managed Care – PPO | Admitting: Cardiovascular Disease

## 2019-12-11 VITALS — BP 118/82 | HR 97 | Ht 64.0 in | Wt 343.0 lb

## 2019-12-11 DIAGNOSIS — R42 Dizziness and giddiness: Secondary | ICD-10-CM | POA: Diagnosis not present

## 2019-12-11 DIAGNOSIS — R0602 Shortness of breath: Secondary | ICD-10-CM | POA: Diagnosis not present

## 2019-12-11 DIAGNOSIS — R072 Precordial pain: Secondary | ICD-10-CM | POA: Diagnosis not present

## 2019-12-11 DIAGNOSIS — R55 Syncope and collapse: Secondary | ICD-10-CM

## 2019-12-11 DIAGNOSIS — E782 Mixed hyperlipidemia: Secondary | ICD-10-CM | POA: Diagnosis not present

## 2019-12-11 LAB — BASIC METABOLIC PANEL
BUN/Creatinine Ratio: 19 (ref 9–23)
BUN: 11 mg/dL (ref 6–24)
CO2: 25 mmol/L (ref 20–29)
Calcium: 9.1 mg/dL (ref 8.7–10.2)
Chloride: 98 mmol/L (ref 96–106)
Creatinine, Ser: 0.59 mg/dL (ref 0.57–1.00)
GFR calc Af Amer: 128 mL/min/{1.73_m2} (ref >59)
GFR calc non Af Amer: 111 mL/min/{1.73_m2} (ref >59)
Glucose: 371 mg/dL — ABNORMAL HIGH (ref 65–99)
Potassium: 4.7 mmol/L (ref 3.5–5.2)
Sodium: 134 mmol/L (ref 134–144)

## 2019-12-11 MED ORDER — ROSUVASTATIN CALCIUM 10 MG PO TABS: 10.0000 mg | ORAL_TABLET | Freq: Every day | ORAL | 3 refills | Status: DC

## 2019-12-11 MED ORDER — APIXABAN (ELIQUIS) VTE STARTER PACK (10MG AND 5MG): ORAL_TABLET | ORAL | 0 refills | Status: DC

## 2019-12-11 MED ORDER — IOHEXOL 350 MG/ML SOLN
100.0000 mL | Freq: Once | INTRAVENOUS | Status: AC | PRN
  Administered 2019-12-11: 100 mL via INTRAVENOUS

## 2019-12-11 NOTE — Patient Instructions (Signed)
Medication Instructions:   START ROSUVASTATIN 10 MG ONCE DAILY  *If you need a refill on your cardiac medications before your next appointment, please call your pharmacy*   Lab Work:  Your physician recommends that you HAVE LAB WORK TODAY  If you have labs (blood work) drawn today and your tests are completely normal, you will receive your results only by: [place] MyChart Message (if you have MyChart) OR . A paper copy in the mail If you have any lab test that is abnormal or we need to change your treatment, we will call you to review the results.   Testing/Procedures:  Your physician has requested that you have an echocardiogram. Echocardiography is a painless test that uses sound waves to create images of your heart. It provides your doctor with information about the size and shape of your heart and how well your heart's chambers and valves are working. This procedure takes approximately one hour. There are no restrictions for this procedure.[address]  CTA OF THE CHEST TO R/O PE ASAP     Follow-Up: At [facility], you and your health needs are our priority.  As part of our continuing mission to provide you with exceptional heart care, we have created designated Provider Care Teams.  These Care Teams include your primary Cardiologist (physician) and Advanced Practice Providers (APPs -  Physician Assistants and Nurse Practitioners) who all work together to provide you with the care you need, when you need it.  We recommend signing up for the patient portal called "MyChart".  Sign up information is provided on this After Visit Summary.  MyChart is used to connect with patients for Virtual Visits (Telemedicine).  Patients are able to view lab/test results, encounter notes, upcoming appointments, etc.  Non-urgent messages can be sent to your provider as well.   To learn more about what you can do with MyChart, go to [email or web address].    Your next appointment:   3  month(s)  The format for your next appointment:   In Person  Provider:   Lennie Odor, MD

## 2019-12-11 NOTE — Telephone Encounter (Signed)
Spoke with pt, aware of CT results. Per dr Flora Lipps eliquis starter pack sent to the pharmacy. Follow up scheduled 2 weeks with dr Flora Lipps.

## 2019-12-11 NOTE — Telephone Encounter (Signed)
New message:   Vicente Serene calling on a CT

## 2019-12-12 ENCOUNTER — Other Ambulatory Visit: Payer: Self-pay | Admitting: Family Medicine

## 2019-12-12 LAB — TSH: TSH: 1.21 u[IU]/mL (ref 0.450–4.500)

## 2019-12-12 LAB — PRO B NATRIURETIC PEPTIDE: NT-Pro BNP: 11 pg/mL (ref 0–249)

## 2019-12-14 ENCOUNTER — Encounter: Payer: Self-pay | Admitting: Nurse Practitioner

## 2019-12-14 MED ORDER — APIXABAN 5 MG PO TABS: ORAL_TABLET | ORAL | 0 refills | Status: DC

## 2019-12-14 NOTE — Telephone Encounter (Signed)
Spoke to pharmacist -  The amount given to pharmacy was from insurance benefit  Possible because prescription was written as a starter pack . RN will contact insurance      RN spoke insurance representative -  They state  Patient has an deductible of $425.52 for medication before  Price will reduce for 2021 . She will have meet the same deductible in 2022    patient is aware . She states still wanted to know if there something cheaper. RN informed here  all her medication will be high until she meet her deductible . Only cheaper medication would possibly be warfarin but that require frequent testing.  RN  informed patient office has a saving card for 30 day  Free medication of Eliquis and weeks worth sample dose  10 mg ( 2 tablets of 5 mg   Twice day ) for patient to pick up.  patient states swill pick up medication today and discuss with Dr Flora Lipps at next appointment on 12/21/19

## 2019-12-14 NOTE — Telephone Encounter (Signed)
Thanks!  Kristy Spore T. Flora Lipps, MD [facility]  [address] [place], [place] [address] [phone]  2:53 PM

## 2019-12-14 NOTE — Telephone Encounter (Signed)
Spoke to patient . She states she called pharmacy - she was  informed that the cost would be $497.    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

## 2019-12-16 ENCOUNTER — Other Ambulatory Visit: Payer: Self-pay

## 2019-12-16 ENCOUNTER — Ambulatory Visit (HOSPITAL_COMMUNITY): Payer: BC Managed Care – PPO | Attending: Cardiovascular Disease

## 2019-12-16 DIAGNOSIS — R0602 Shortness of breath: Secondary | ICD-10-CM | POA: Insufficient documentation

## 2019-12-16 LAB — ECHOCARDIOGRAM COMPLETE
Area-P 1/2: 4.06 cm2
S' Lateral: 3.5 cm

## 2019-12-16 MED ORDER — PERFLUTREN LIPID MICROSPHERE
1.0000 mL | INTRAVENOUS | Status: AC | PRN
  Administered 2019-12-16: 1 mL via INTRAVENOUS

## 2019-12-17 ENCOUNTER — Other Ambulatory Visit: Payer: Self-pay | Admitting: Family Medicine

## 2019-12-17 DIAGNOSIS — F411 Generalized anxiety disorder: Secondary | ICD-10-CM

## 2019-12-17 DIAGNOSIS — F331 Major depressive disorder, recurrent, moderate: Secondary | ICD-10-CM

## 2019-12-20 NOTE — Progress Notes (Signed)
Cardiology Office Note:   Date:  12/21/2019  NAME:  Kristy Conrad    MRN: [id number] DOB:  12-15-1975   PCP:  Lynnda Child, MD  Cardiologist:  No primary care provider on file.   Referring MD: Lynnda Child, MD   Chief Complaint  Patient presents with  . Follow-up   History of Present Illness:   Kristy Conrad is a 44 y.o. female with a hx of morbid obesity, DM, HLD, PE who presents for follow-up. She was seen 12/10 for SOB and found to have a PE. Echo normal. BNP normal.  She was started on Eliquis.  Doing well on this.  No bleeding reported.  Cost is an issue when she was given a co-pay card.  She is relieved to know that she had a blood clot.  I did review her echocardiogram which was normal.  She has normal right heart.  No LV dysfunction.  Normal diastolic function.  Blood pressure 140/86.  We did discuss that her shortness of breath is likely multifactorial.  It is likely related to deconditioning as well as her COVID-19 pneumonia.  CT scan shows that her lungs are now clear.  Her BMI 65.  She is now ready to go to healthy weight and wellness.  She is also may contemplate bariatric surgery.  I think this is warranted.  We also discussed sleep apnea.  She is willing to be referred to a sleep medicine specialist.  She does have a machine and would like to discuss with the physician about possibly using the one she has.  I think this is a good idea.  Overall appears to be doing about the same.  Just diagnosed with a PE.  Problem List 1. Obesity -347 lbs -BMI 59 2. DM -A1c 8.8 -coronary calcium score = 0  3. HLD -T chol 128, HDL 38, LDL 70, TG 108 4. OSA 5. PE -12/11/2019  Past Medical History: Past Medical History:  Diagnosis Date  . Depression   . Diabetes mellitus   . Hyperlipidemia   . Hypertension   . Obesity   . PCOS (polycystic ovarian syndrome)   . Pulmonary embolism [facility])     Past Surgical History: Past Surgical History:  Procedure Laterality Date  .  BREAST SURGERY     breast reduction  . CHOLECYSTECTOMY    . WISDOM TOOTH EXTRACTION      Current Medications: Current Meds  Medication Sig  . albuterol (VENTOLIN HFA) 108 (90 Base) MCG/ACT inhaler TAKE 2 PUFFS BY MOUTH EVERY 6 HOURS AS NEEDED FOR WHEEZE OR SHORTNESS OF BREATH  . APIXABAN (ELIQUIS) VTE STARTER PACK (10MG  AND 5MG ) Take as directed on package: start with two-5mg  tablets twice daily for 7 days. On day 8, switch to one-5mg  tablet twice daily.  . cetirizine (ZYRTEC) 10 MG tablet Take 10 mg by mouth daily.  . Cholecalciferol (VITAMIN D3) 50 MCG (2000 UT) TABS Take 50 mcg by mouth daily.  . COLLAGEN PO Take by mouth.  FLUoxetine (PROZAC) 20 MG capsule TAKE 1 CAPSULE BY MOUTH EVERY DAY  . folic acid (FOLVITE) 1 MG tablet Take 1 mg by mouth daily.  glipiZIDE (GLUCOTROL) 5 MG tablet TAKE 1 TABLET (5 MG TOTAL) BY MOUTH 2 (TWO) TIMES DAILY BEFORE A MEAL.  Marland Kitchen Glucosamine HCl-MSM (GLUCOSAMINE-MSM PO) Take 1,500 mg by mouth daily.  Marland Kitchen lisinopril (ZESTRIL) 5 MG tablet TAKE ONE TABLET BY MOUTH DAILY  . meloxicam (MOBIC) 15 MG tablet TAKE 1  TABLET BY MOUTH EVERY DAY WITH FOOD  . metFORMIN (GLUCOPHAGE) 1000 MG tablet TAKE 1 TABLET BY MOUTH TWICE A DAY  . rosuvastatin (CRESTOR) 10 MG tablet Take 1 tablet (10 mg total) by mouth daily.  . Turmeric Curcumin 500 MG CAPS Take 3 capsules by mouth daily.   . [DISCONTINUED] apixaban (ELIQUIS) 5 MG TABS tablet Take 2 tablets (10mg ) twice daily for 7 days, then 1 tablet (5mg ) twice daily     Allergies:    Morphine   Social History: Social History   Socioeconomic History  . Marital status: Single    Spouse name: Not on file  . Number of children: 0  . Years of education: masters in education  . Highest education level: Not on file  Occupational History  . Occupation: : [employer]  Tobacco Use  . Smoking status: Never Smoker  . Smokeless tobacco: Never Used  Substance and Sexual Activity  . Alcohol use: Yes     Comment: 1-2 times per month  . Drug use: No  . Sexual activity: Not Currently    Partners: Male  Other Topics Concern  . 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   Diet: not good, does not follow diabetic diet      Safety   Seat belts: Yes    Guns: No   Safe in relationships: Yes    Social Determinants of [employer] Strain: Not on file  Food Insecurity: Not on file  Transportation Needs: Not on file  Physical Activity: Not on file  Stress: Not on file  Social Connections: Not on file     Family History: The patient's family history includes Cancer in her paternal grandfather; Cancer (age of onset: 97) in her mother; Emphysema in her maternal grandfather and paternal grandfather; Heart attack (age of onset: 80) in her father; Heart disease in her maternal grandmother; Lung cancer in her maternal grandfather.  ROS:   All other ROS reviewed and negative. Pertinent positives noted in the HPI.     EKGs/Labs/Other Studies Reviewed:   The following studies were personally reviewed by me today:  TTE 12/16/2019 1. Left ventricular ejection fraction, by estimation, is 55 to 60%. The  left ventricle has normal function. The left ventricle has no regional  wall motion abnormalities. Left ventricular diastolic parameters were  normal.  2. Right ventricular systolic function is normal. The right ventricular  size is normal.  3. Left atrial size was moderately dilated.  4. The mitral valve is normal in structure. No evidence  of mitral valve  regurgitation. No evidence of mitral stenosis.  5. The aortic valve is normal in structure. Aortic valve regurgitation is  not visualized. No aortic stenosis is present.  6. Aortic dilatation noted. There is mild dilatation at the level of the  sinuses of Valsalva, measuring 40 mm.  7. The inferior vena cava is normal in size  with greater than 50%  respiratory variability, suggesting right atrial pressure of 3 mmHg.   CTA 12/11/2019  Small filling defect is seen in peripheral lower lobe branch of the right pulmonary artery consistent with pulmonary embolus.  Recent Labs: 01/06/2019: ALT 34; Hemoglobin 12.3; Platelets 332 12/11/2019: BUN 11; Creatinine, Ser 0.59; NT-Pro BNP 11; Potassium 4.7; Sodium 134; TSH 1.210   Recent Lipid Panel    Component Value Date/Time   CHOL 128 01/06/2019 1031   TRIG 108 01/06/2019 1031   HDL 38 (L) 01/06/2019 1031   CHOLHDL 3.4 01/06/2019 1031   CHOLHDL 3 09/03/2017 1304   VLDL 20.6 09/03/2017 1304   LDLCALC 70 01/06/2019 1031    Physical Exam:   VS:  BP 140/86 (BP Location: Right Arm, Patient Position: Sitting)   Pulse 87   Ht 5\' 1"  (1.549 m)   Wt (!) 344 lb 12.8 oz (156.4 kg)   SpO2 96%   BMI 65.15 kg/m    Wt Readings from Last 3 Encounters:  12/21/19 (!) 344 lb 12.8 oz (156.4 kg)  12/11/19 (!) 343 lb (155.6 kg)  12/01/19 (!) 347 lb 0.1 oz (157.4 kg)    General: Well nourished, well developed, in no acute distress Head: Atraumatic, normal size  Eyes: PEERLA, EOMI  Neck: Supple, no JVD Endocrine: No thryomegaly Cardiac: Normal S1, S2; RRR; no murmurs, rubs, or gallops Lungs: Clear to auscultation bilaterally, no wheezing, rhonchi or rales  Abd: Soft, nontender, no hepatomegaly  Ext: No edema, pulses 2+ Musculoskeletal: No deformities, BUE and BLE strength normal and equal Skin: Warm and dry, no rashes   Neuro: Alert and oriented to person, place, time, and situation, CNII-XII grossly intact, no focal deficits   Psych: Normal mood and affect   ASSESSMENT:   Kristy Conrad is a 44 y.o. female who presents for the following: 1. Other pulmonary embolism without acute cor pulmonale, unspecified chronicity (HCC)   2. SOB (shortness of breath) on exertion   3. Mixed hyperlipidemia   4. Obesity, morbid, BMI 50 or higher (HCC)   5. OSA (obstructive sleep apnea)     PLAN:   1. Other pulmonary embolism without acute cor pulmonale, unspecified chronicity (HCC) -Small pulmonary embolism diagnosed on recent CT.  Likely residually related from Covid.  Also could be obesity related.  She will continue Eliquis.  She is on 10 mg twice a day for 1 week and then will continue 5 mg twice a day.  She was given samples and a card.  She will continue 3 to 6 months of therapy.  Most recent echo shows right heart is normal.  No evidence of any right heart strain.  2. SOB (shortness of breath) on exertion -Echocardiogram normal.  Normal diastolic function.  She has no evidence of heart failure on examination.  Coronary calcium score of 0.  I highly suspect her symptoms are related to deconditioning and recent Covid.  I think things will continue to improve with weight loss and exercise.  She will continue on this.  3. Mixed hyperlipidemia -Cholesterol at goal.  On Crestor 10 mg a day.  Goal less than 70 given diabetes.  4. Obesity, morbid, BMI 50 or higher (HCC) -Referral to healthy weight and wellness.  She should strongly consider bariatric surgery.  5. OSA (obstructive sleep apnea) -Refer to Carolanne Grumblingracy Turner for CPAP titration.  Disposition: Return in about 3 months (around 03/20/2020).  Medication Adjustments/Labs and Tests Ordered: Current medicines are reviewed at length with the patient  today.  Concerns regarding medicines are outlined above.  Orders Placed This Encounter  Procedures  . Amb Ref to Medical Weight Management  . Ambulatory referral to Sleep Studies   Meds ordered this encounter  Medications   . apixaban (ELIQUIS) 5 MG TABS tablet    Sig: Take 1 tablet (5 mg total) by mouth 2 (two) times daily.    Dispense:  60 tablet    Refill:  3    Patient Instructions  Medication Instructions:  Start Eliquis 5 mg twice a day.  *If you need a refill on your cardiac medications before your next appointment, please call your pharmacy*   Follow-Up: At [facility], you and your health needs are our priority.  As part of our continuing mission to provide you with exceptional heart care, we have created designated Provider Care Teams.  These Care Teams include your primary Cardiologist (physician) and Advanced Practice Providers (APPs -  Physician Assistants and Nurse Practitioners) who all work together to provide you with the care you need, when you need it.  We recommend signing up for the patient portal called "MyChart".  Sign up information is provided on this After Visit Summary.  MyChart is used to connect with patients for Virtual Visits (Telemedicine).  Patients are able to view lab/test results, encounter notes, upcoming appointments, etc.  Non-urgent messages can be sent to your provider as well.   To learn more about what you can do with MyChart, go to [email or web address].    Your next appointment:   3 month(s)  The format for your next appointment:   In Person  Provider:   Lennie Odor, MD   Other Instructions Referral to Healthy Weight and Wellness Please see Armanda Magic, MD for sleep apnea      Time Spent with Patient: I have spent a total of 25 minutes with patient reviewing hospital notes, telemetry, EKGs, labs and examining the patient as well as establishing an assessment and plan that was discussed with the patient.  > 50% of time was spent in direct patient care.  Signed, Lenna Gilford. Flora Lipps, MD [facility]  [address] [place], [place] [address] [phone]  12/21/2019 4:56 PM

## 2019-12-21 ENCOUNTER — Ambulatory Visit: Payer: BC Managed Care – PPO | Admitting: Cardiovascular Disease

## 2019-12-21 ENCOUNTER — Other Ambulatory Visit: Payer: Self-pay

## 2019-12-21 ENCOUNTER — Encounter: Payer: Self-pay | Admitting: Cardiovascular Disease

## 2019-12-21 VITALS — BP 140/86 | HR 87 | Ht 61.0 in | Wt 344.8 lb

## 2019-12-21 DIAGNOSIS — E782 Mixed hyperlipidemia: Secondary | ICD-10-CM | POA: Diagnosis not present

## 2019-12-21 DIAGNOSIS — R0602 Shortness of breath: Secondary | ICD-10-CM | POA: Diagnosis not present

## 2019-12-21 DIAGNOSIS — I2699 Other pulmonary embolism without acute cor pulmonale: Secondary | ICD-10-CM

## 2019-12-21 DIAGNOSIS — G4733 Obstructive sleep apnea (adult) (pediatric): Secondary | ICD-10-CM

## 2019-12-21 MED ORDER — APIXABAN 5 MG PO TABS: 5.0000 mg | ORAL_TABLET | Freq: Two times a day (BID) | ORAL | 3 refills | Status: DC

## 2019-12-21 NOTE — Patient Instructions (Signed)
Medication Instructions:  Start Eliquis 5 mg twice a day.  *If you need a refill on your cardiac medications before your next appointment, please call your pharmacy*   Follow-Up: At [facility], you and your health needs are our priority.  As part of our continuing mission to provide you with exceptional heart care, we have created designated Provider Care Teams.  These Care Teams include your primary Cardiologist (physician) and Advanced Practice Providers (APPs -  Physician Assistants and Nurse Practitioners) who all work together to provide you with the care you need, when you need it.  We recommend signing up for the patient portal called "MyChart".  Sign up information is provided on this After Visit Summary.  MyChart is used to connect with patients for Virtual Visits (Telemedicine).  Patients are able to view lab/test results, encounter notes, upcoming appointments, etc.  Non-urgent messages can be sent to your provider as well.   To learn more about what you can do with MyChart, go to [email or web address].    Your next appointment:   3 month(s)  The format for your next appointment:   In Person  Provider:   Lennie Odor, MD   Other Instructions Referral to Healthy Weight and Wellness Please see Armanda Magic, MD for sleep apnea

## 2019-12-22 LAB — HM DIABETES EYE EXAM

## 2019-12-28 ENCOUNTER — Ambulatory Visit (INDEPENDENT_AMBULATORY_CARE_PROVIDER_SITE_OTHER): Payer: BC Managed Care – PPO | Admitting: Family Medicine

## 2019-12-28 ENCOUNTER — Other Ambulatory Visit: Payer: Self-pay

## 2019-12-28 ENCOUNTER — Encounter: Payer: Self-pay | Admitting: Family Medicine

## 2019-12-28 VITALS — BP 120/80 | HR 91 | Temp 96.9°F | Ht 61.0 in | Wt 346.5 lb

## 2019-12-28 DIAGNOSIS — E0822 Diabetes mellitus due to underlying condition with diabetic chronic kidney disease: Secondary | ICD-10-CM

## 2019-12-28 DIAGNOSIS — I2699 Other pulmonary embolism without acute cor pulmonale: Secondary | ICD-10-CM | POA: Diagnosis not present

## 2019-12-28 DIAGNOSIS — Z23 Encounter for immunization: Secondary | ICD-10-CM | POA: Diagnosis not present

## 2019-12-28 LAB — POCT GLYCOSYLATED HEMOGLOBIN (HGB A1C): Hemoglobin A1C: 9.9 % — AB (ref 4.0–5.6)

## 2019-12-28 NOTE — Assessment & Plan Note (Signed)
Hemoglobin a1c is worse than prior. She is meeting with weight loss group. Cost is a significant concern. Information for GLP-1 and SGLT-2 medications provided she will check insurance and get back to me. Also advised looking into assistance programs. Cont metformin 1000 mg bid and glipizide 5 mg bid.

## 2019-12-28 NOTE — Assessment & Plan Note (Signed)
Recently diagnosed and on apixaban which is costly. She does not want coumadin. She will continue to work to cover the cost. Cont apixaban. Appreciate support from post-covid clinic.

## 2019-12-28 NOTE — Assessment & Plan Note (Signed)
Not interested in weight loss surgery due to risk of surgery at this time. She has upcoming appoint with weight loss clinic - appreciate support. Do think she would benefit greatly from a medication like ozempic if able to afford for diabetes and weight loss benefit.

## 2019-12-28 NOTE — Progress Notes (Signed)
Subjective:     Kristy Conrad is a 44 y.o. female presenting for Follow-up (3 month- DM )     HPI   #Diabetes Currently taking metformin, glipizide  Using medications without difficulties: Yes Hypoglycemic episodes:does not check Hyperglycemic episodes:does not check Feet problems:No  Blood Sugars averaging: n/a Last HgbA1c:  Lab Results  Component Value Date   HGBA1C 9.9 (A) 12/28/2019   Diet: on and off, the last few months have been difficulty - no energy, will eat sandwiches   Diabetes Health Maintenance Due:    Diabetes Health Maintenance Due  Topic Date Due  . OPHTHALMOLOGY EXAM  03/28/2018  . FOOT EXAM  06/15/2020  . HEMOGLOBIN A1C  06/27/2020      Review of Systems   Social History   Tobacco Use  Smoking Status Never Smoker  Smokeless Tobacco Never Used        Objective:    BP Readings from Last 3 Encounters:  12/28/19 120/80  12/21/19 140/86  12/11/19 118/82   Wt Readings from Last 3 Encounters:  12/28/19 (!) 346 lb 8 oz (157.2 kg)  12/21/19 (!) 344 lb 12.8 oz (156.4 kg)  12/11/19 (!) 343 lb (155.6 kg)    BP 120/80   Pulse 91   Temp (!) 96.9 F (36.1 C) (Temporal)   Ht 5\' 1"  (1.549 m)   Wt (!) 346 lb 8 oz (157.2 kg)   SpO2 97%   BMI 65.47 kg/m    Physical Exam Constitutional:      General: She is not in acute distress.    Appearance: She is well-developed. She is obese. She is not diaphoretic.  HENT:     Right Ear: External ear normal.     Left Ear: External ear normal.     Nose: Nose normal.  Eyes:     Conjunctiva/sclera: Conjunctivae normal.  Cardiovascular:     Rate and Rhythm: Normal rate and regular rhythm.     Heart sounds: No murmur heard.   Pulmonary:     Effort: Pulmonary effort is normal. No respiratory distress.     Breath sounds: Normal breath sounds. No wheezing.  Musculoskeletal:     Cervical back: Neck supple.  Skin:    General: Skin is warm and dry.     Capillary Refill: Capillary refill takes  less than 2 seconds.  Neurological:     Mental Status: She is alert. Mental status is at baseline.  Psychiatric:        Mood and Affect: Mood normal.        Behavior: Behavior normal.           Assessment & Plan:   Problem List Items Addressed This Visit      Cardiovascular and Mediastinum   Pulmonary embolism (HCC)    Recently diagnosed and on apixaban which is costly. She does not want coumadin. She will continue to work to cover the cost. Cont apixaban. Appreciate support from post-covid clinic.         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 She is meeting with weight loss group. Cost is a significant concern. Information for GLP-1 and SGLT-2 medications provided she will check insurance and get back to me. Also advised looking into assistance programs. Cont metformin 1000 mg bid and glipizide 5 mg bid.       Relevant Orders   POCT glycosylated hemoglobin (Hb A1C) (Completed)  Other   Obesity, morbid, BMI 50 or higher (HCC)    Not interested in weight loss surgery due to risk of surgery at this time. She has upcoming appoint with weight loss clinic - appreciate support. Do think she would benefit greatly from a medication like ozempic if able to afford for diabetes and weight loss benefit.        Other Visit Diagnoses    Need for influenza vaccination       Relevant Orders   Flu Vaccine QUAD 36+ mos IM (Completed)       Return in about 3 months (around 03/27/2020).  Lynnda Child, MD  This visit occurred during the SARS-CoV-2 public health emergency.  Safety protocols were in place, including screening questions prior to the visit, additional usage of staff PPE, and extensive cleaning of exam room while observing appropriate contact time as indicated for disinfecting solutions.

## 2019-12-28 NOTE — Patient Instructions (Addendum)
#   Medication options:  1) GLP-1 receptor agonsit - Ozempic, Trulicity, Victoza  OR 2) SGLT-2 inhibitor - Jardiance (pill) - (most common side effect is yeast infection)  1) Call insurance to figure out tiers/preference  2) Look into medication assistance programs from the website  Make sure to get your eye exam for diabetes check up

## 2019-12-31 ENCOUNTER — Other Ambulatory Visit: Payer: Self-pay | Admitting: Family Medicine

## 2019-12-31 DIAGNOSIS — F411 Generalized anxiety disorder: Secondary | ICD-10-CM

## 2019-12-31 DIAGNOSIS — F331 Major depressive disorder, recurrent, moderate: Secondary | ICD-10-CM

## 2020-01-04 ENCOUNTER — Encounter: Payer: Self-pay | Admitting: Family Medicine

## 2020-01-07 ENCOUNTER — Encounter: Payer: Self-pay | Admitting: Nurse Practitioner

## 2020-01-07 ENCOUNTER — Telehealth: Payer: Self-pay | Admitting: Nurse Practitioner

## 2020-01-07 ENCOUNTER — Other Ambulatory Visit: Payer: Self-pay | Admitting: Nurse Practitioner

## 2020-01-07 DIAGNOSIS — U099 Post covid-19 condition, unspecified: Secondary | ICD-10-CM

## 2020-01-07 DIAGNOSIS — R0600 Dyspnea, unspecified: Secondary | ICD-10-CM

## 2020-01-07 DIAGNOSIS — R0609 Other forms of dyspnea: Secondary | ICD-10-CM

## 2020-01-07 NOTE — Telephone Encounter (Signed)
Pt calling to speak to provider regarding work letter and forms. Per provider request printed PCP letter for provider at [facility] for collaboration for completing forms.

## 2020-01-08 ENCOUNTER — Encounter: Payer: Self-pay | Admitting: Family Medicine

## 2020-01-12 ENCOUNTER — Encounter: Payer: BC Managed Care – PPO | Attending: Cardiovascular Disease | Admitting: Dietician

## 2020-01-12 ENCOUNTER — Other Ambulatory Visit: Payer: Self-pay

## 2020-01-12 DIAGNOSIS — Z6841 Body Mass Index (BMI) 40.0 and over, adult: Secondary | ICD-10-CM | POA: Diagnosis not present

## 2020-01-12 DIAGNOSIS — E0822 Diabetes mellitus due to underlying condition with diabetic chronic kidney disease: Secondary | ICD-10-CM

## 2020-01-12 NOTE — Progress Notes (Signed)
Medical Nutrition Therapy  Appointment Start time:  1600  Appointment End time:  1720  Primary concerns today: Improving Health Referral diagnosis: E66.01 Obesity, morbid, BMI 50 or higher Preferred learning style: Visual Learning readiness:  Contemplating   NUTRITION ASSESSMENT   Anthropometrics  Ht: 5'4" Wt:342.2 lbs Body mass index is 58.74 kg/m.    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 This has led to increased stress.  Pt states their #1 goal is to get healthy. Wants to lose weight, lessen depressive symptoms, lower knee pain and increase ambulation. Pt got cats in May and they are the light of her life, have helped with stress. Pt reports being able to hide depression well, and has become an introvert. Pt states they are hard on themselves and self-disclosed OCD. Pt states they do well with accountability. Pt reports long haul COVID symptoms, increased SOB. Pt reports a fall in November, but doesn't know what happened. States they were doing chores and may have gotten lightheaded or tripped over a cat. Pt reports mild edema daily in the ankle region.  Pt reports having arthritis in both knees. Pt is looking into knee replacements and a potential stem cell treatment to treat arthritis. Pt has history with periods of weight loss using weight watchers, weight loss supplements. Pt reports using phentermine and losing 40 pounds in 2 months. Pt reports occasional diarrhea after eating high fat foods, has history of cholecystectomy. Pt is trying to ease back into water aerobics after COVID to improve stamina and endurance. Pt states that [place] water is the only water they drink. Pt has OSA and  tried CPAP, reports being freaked out by the mask. States they are going to try again. Pt usually eats 1-2 meals a day when at home, 3 meals while at work. Pt goes to bed at 3-4 am, wakes up between 9-12 am. Pt will not eat seafood.   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 Pt responds to stress by retreating, formerly would stress. Current average weekly physical activity:  Water aerobics, and Yoga occasionally  24-Hr Dietary Recall First Meal: none Snack: none Second Meal: Cheese sticks Snack: none Third Meal: [employer] Chicken Sandwich, 2 healthy choice fudgesicles Snack: none Beverages: unsweetened tea, water   NUTRITION DIAGNOSIS  NB-1.1 Food and nutrition-related knowledge deficit As related to diabetes.  As evidenced by A1c of 9.9, sedentary lifestlye, and skipping meals.   NUTRITION INTERVENTION  Nutrition education (E-1) on the following topics:   Educated patient on factors that contribute to elevation of blood sugars, such as stress, illness, injury,and food choices. Discussed the role that physical activity plays in lowering blood sugar. Educated patient on the importance of eating 3 meals consistenly about 5-6 hours apart to help blood sugar stay better regulated. Counseled patient on beginning to rebuild their trust in themselves to make the right food choices for their health. Educated patient on mindful eating, including listening to their body's hunger and satiety cues, as well as eating slowly and allowing meals to be more of a sensory  experience.    Handouts Provided Include   AVS  Learning Style & Readiness for Change Teaching method utilized: Visual & Auditory  Demonstrated degree of understanding via: Teach Back  Barriers  to learning/adherence to lifestyle change: none  Goals Established by Pt  Find a comfortable mask for your CPAP machine to help you get better sleep each night.  Consider watching "Yes Man" with Leretha Pol!  Work on getting back to three meals a day.  Begin to attend the 9 am [employer] water aerobics on Saturday mornings! Go to as many times between now and when you start working again! Eat a breakfast at least an hour before you go!  Take a food log of what you ate, how much, and when!   MONITORING & EVALUATION Dietary intake, weekly physical activity, and meal consistency in 6 weeks.  Next Steps  Patient is to log food and beverage choices and email it to dietitian before next follow up

## 2020-01-12 NOTE — Patient Instructions (Addendum)
Find a comfortable mask for your CPAP machine to help you get better sleep each night.  Consider watching "Yes Man" with Leretha Pol!  Work on getting back to three meals a day.  Begin to attend the 9 am [employer] water aerobics on Saturday mornings! Go to as many times between now and when you start working again! Eat a breakfast at least an hour before you go!  Take a food log of what you ate, how much, and when!

## 2020-01-13 ENCOUNTER — Telehealth: Payer: Self-pay | Admitting: Nurse Practitioner

## 2020-01-13 NOTE — Telephone Encounter (Signed)
Pt dropped off 2 sets of forms to be completed by provider & uploaded to MyChart for pt to complete her part & pt states she will forward to appropriate people. Pt also states needing specific letter with more detail per her employer HR dept. FMLA forms and STD forms ([employer]). Signed authorization obtained. Call pt if needed [phone]. Forms given to provider.

## 2020-01-14 NOTE — Telephone Encounter (Signed)
Pt called checking status of LETTER for work. Pt states please call her with questions. Pt states letter needs to say how many pounds she can lift, how many feet she can walk, & it needs to specify light duty. Pt ph [phone].

## 2020-01-15 ENCOUNTER — Encounter: Payer: Self-pay | Admitting: Nurse Practitioner

## 2020-01-17 ENCOUNTER — Other Ambulatory Visit: Payer: Self-pay | Admitting: Family Medicine

## 2020-01-26 ENCOUNTER — Other Ambulatory Visit: Payer: Self-pay

## 2020-01-26 MED ORDER — MELOXICAM 15 MG PO TABS: ORAL_TABLET | ORAL | 3 refills | Status: DC

## 2020-02-02 ENCOUNTER — Encounter: Payer: Self-pay | Admitting: Nurse Practitioner

## 2020-02-03 ENCOUNTER — Institutional Professional Consult (permissible substitution): Payer: Self-pay | Admitting: Pulmonary Disease

## 2020-02-04 ENCOUNTER — Encounter: Payer: Self-pay | Admitting: Nurse Practitioner

## 2020-02-08 ENCOUNTER — Encounter: Payer: Self-pay | Admitting: Optometry

## 2020-02-18 ENCOUNTER — Other Ambulatory Visit: Payer: Self-pay

## 2020-02-18 ENCOUNTER — Encounter: Payer: Self-pay | Admitting: Pulmonary Disease

## 2020-02-18 ENCOUNTER — Ambulatory Visit: Payer: BC Managed Care – PPO | Admitting: Pulmonary Disease

## 2020-02-18 VITALS — BP 136/86 | HR 83 | Temp 97.3°F | Ht 63.0 in | Wt 358.0 lb

## 2020-02-18 DIAGNOSIS — R0600 Dyspnea, unspecified: Secondary | ICD-10-CM

## 2020-02-18 DIAGNOSIS — I2699 Other pulmonary embolism without acute cor pulmonale: Secondary | ICD-10-CM

## 2020-02-18 DIAGNOSIS — R0609 Other forms of dyspnea: Secondary | ICD-10-CM

## 2020-02-18 DIAGNOSIS — R059 Cough, unspecified: Secondary | ICD-10-CM

## 2020-02-18 MED ORDER — BREO ELLIPTA 200-25 MCG/INH IN AEPB: 1.0000 | INHALATION_SPRAY | Freq: Every day | RESPIRATORY_TRACT | 0 refills | Status: DC

## 2020-02-18 MED ORDER — BREO ELLIPTA 200-25 MCG/INH IN AEPB: 1.0000 | INHALATION_SPRAY | Freq: Every day | RESPIRATORY_TRACT | 11 refills | Status: DC

## 2020-02-18 NOTE — Patient Instructions (Addendum)
Nice to meet you  Use breo 1 puff daily every day - rinse out your mouth after every use  I have provided samples  If too expensive please let us know  We will call to schedule PFTs the week of spring break   We will schedule a telephone visit the following week with Dr. Judeth Horn to go over results and see how you are doing

## 2020-02-29 ENCOUNTER — Other Ambulatory Visit: Payer: Self-pay

## 2020-02-29 ENCOUNTER — Other Ambulatory Visit: Payer: Self-pay | Admitting: Family Medicine

## 2020-02-29 ENCOUNTER — Ambulatory Visit: Payer: BC Managed Care – PPO | Attending: Nurse Practitioner

## 2020-02-29 DIAGNOSIS — M6281 Muscle weakness (generalized): Secondary | ICD-10-CM | POA: Insufficient documentation

## 2020-02-29 NOTE — Therapy (Signed)
[facility] [address] [place], [address] Phone: [phone]   Fax:  [phone]  Physical Therapy Evaluation  Patient Details  Name: Kristy Conrad MRN: [id number] Date of Birth: 1975-09-23 No data recorded  Encounter Date: 02/29/2020   PT End of Session - 02/29/20 1134    Visit Number 1    Number of Visits 17    Date for PT Re-Evaluation 04/25/20    PT Start Time 1040    PT Stop Time 1115    PT Time Calculation (min) 35 min    Activity Tolerance Patient tolerated treatment well;Patient limited by fatigue    Behavior During Therapy [facility] for tasks assessed/performed           Past Medical History:  Diagnosis Date  . Depression   . Diabetes mellitus   . Hyperlipidemia   . Hypertension   . Obesity   . PCOS (polycystic ovarian syndrome)   . Pulmonary embolism [facility])     Past Surgical History:  Procedure Laterality Date  . BREAST SURGERY     breast reduction  . CHOLECYSTECTOMY    . WISDOM TOOTH EXTRACTION      There were no vitals filed for this visit.    Subjective Assessment - 02/29/20 1041    Subjective Patient reports being diagnosed with COVID 10/13/19. Recieved treatment and went back to work 10/20/19 but felt increased SOB and decreaed energy. CT of lungs found pulmonary embolism in L lung. Feels increased muscular tightness and weakeness especially on R lower extremity. 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  Morning- feels weakness and tightness in the mornings, walking into work less than 5 min needs to take a break. Has arthritis in knees. Unsteady with gait. Knee pain- 3/10 Currently, 10/10 after a day of physical activity. Fall in december but unsure of reason. No unexplained wt loss, night sweats. Goals to stand up straight, get back to water aerobics, and get more confident with walking.    Limitations  Standing;Walking;House hold activities    How long can you sit comfortably? unlimited    How long can you stand comfortably? 10 min    How long can you walk comfortably? 3 min    Diagnostic tests CT scan, XRAY    Currently in Pain? Yes    Pain Score 3     Pain Location Knee    Pain Orientation Right;Left    Pain Descriptors / Indicators Aching;Constant    Pain Type Chronic pain    Pain Onset More than a month ago    Pain Frequency Constant    Aggravating Factors  walking/physical activity             Gait- increased ER on right leg when walking Posture- Forward flexed posture   ROM Hip flexion- 90 degrees bilateral Hip extension- Unable to get into position- limited in standing Knee flexion- 50% limited bilaterally Knee extension- WNL Hip abduction- WNL  Strength Hip flexion- 4/5 Bilateral Hip Extension- 4/5 in standing Hip abduction- 4+/5 bilateral Hip adduction- 3/5 bilateral Knee flexion- 4+/5 strength bilateral but pain on R   Knee extension- 3+/5 bilateral Dorsiflexion- 5/5bilateral    Tests 30s STS- 9- more in knees, a little bit out of breath [id number] 98% SpO2/90 BPM- rest 88% SpO2/147BPM after    Objective measurements completed on examination: See above findings.       PT Education -  02/29/20 1133    Education Details HEP/progosis and diagnosis    Person(s) Educated Patient    Methods Explanation;Demonstration;Tactile cues    Comprehension Verbalized understanding;Returned demonstration            PT Short Term Goals - 02/29/20 1145      PT SHORT TERM GOAL #1   Title Patient will show independence with HEP to improve cardiovascular ability and LE strength    Baseline 02/29/20- dependent    Time 3    Period Weeks    Status New    Target Date 03/21/20             PT Long Term Goals - 02/29/20 1149      PT LONG TERM GOAL #1   Title Patient will improve by 341ft to show improved cardivascular ability    Baseline  02/29/20- 673 ft    Time 6    Period Weeks    Status New    Target Date 04/11/20      PT LONG TERM GOAL #2   Title Patient will improve performance in 30s sit to stand test to 15 reps show improved LE strength    Baseline 02/29/20- 9reps    Time 6    Period Weeks    Status New    Target Date 04/11/20      PT LONG TERM GOAL #3   Title Patient will be able to walk for 15 min without taking a break to show improved cardiavasuclar function    Baseline 02/29/20- 3 min    Time 6    Period Weeks    Status New    Target Date 04/11/20      PT LONG TERM GOAL #4   Title FOTO goal                  Plan - 02/29/20 1135    Clinical Impression Statement Patient is 45yo female with c/c of increased fatigue and muscle weakness s/p COVID diagnosis in october. Patient showed dysfunction with cardiovascular system due to need for rest during and O2 saturation dropping from 98 at rest to 89 during activity. Patient alsot showed dysfunction with LE strength due to 4/5 strength with hip extension and reduced AROM in hips and knees. These limitations have limited the patients ability to perform work related function and perform water aerobics. Patient will benefit from skilled therapy to return to PLOF    Personal Factors and Comorbidities Comorbidity 2    Comorbidities Obesity, Diabetes    Examination-Activity Limitations Squat;Stairs;Stand;Other;Transfers;Locomotion Level    Examination-Participation Restrictions Occupation;Other    Stability/Clinical Decision Making Evolving/Moderate complexity    Clinical Decision Making Moderate    Rehab Potential Good    PT Frequency 2x / week    PT Duration 8 weeks    PT Treatment/Interventions ADLs/Self Care Home Management;Cryotherapy;Electrical Stimulation;Moist Heat;Ultrasound;Gait training;Stair training;Functional mobility training;Therapeutic activities;Therapeutic exercise;Balance training;Neuromuscular re-education;Manual techniques;Passive  range of motion;Energy conservation;Spinal Manipulations;Joint Manipulations    PT Next Visit Plan Check balance, Hip IR/ER strength, LE strengthening    Consulted and Agree with Plan of Care Patient           Patient will benefit from skilled therapeutic intervention in order to improve the following deficits and impairments:  Abnormal gait,Cardiopulmonary status limiting activity,Decreased activity tolerance,Decreased balance,Decreased coordination,Decreased endurance,Decreased mobility,Decreased range of motion,Decreased strength,Difficulty walking,Hypomobility,Pain  Visit Diagnosis: Muscle weakness (generalized)     Problem List Patient Active Problem List   Diagnosis Date Noted  .  Pulmonary embolism (HCC) 12/28/2019  . Postural dizziness with presyncope 12/03/2019  . History of COVID-19 11/03/2019  . Shortness of breath 11/03/2019  . Acute respiratory failure with hypoxia (HCC) 11/03/2019  . Generalized anxiety disorder 02/23/2019  . Sunburn 08/21/2018  . Chronic pain of right knee 03/27/2017  . Transient adjustment reaction with anxiety 05/02/2015  . Edema 01/19/2013  . OSA (obstructive sleep apnea) 09/15/2012  . Obesity, morbid, BMI 50 or higher (HCC) 11/12/2011  . 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 POLYCYSTIC OVARIES 07/08/2009  . DEPRESSION, MAJOR, RECURRENT, MODERATE 07/08/2009    Silvano Rusk 02/29/2020, 12:04 PM Silvano Rusk SPT   [facility] PHYSICAL AND SPORTS MEDICINE [address], [place], [address] Phone: [phone]   Fax:  [phone]  Name: LONITA DEBES MRN: [id number] Date of Birth: 23-Jul-1975

## 2020-02-29 NOTE — Addendum Note (Signed)
Addended by: Bethanie Dicker on: 02/29/2020 01:46 PM   Modules accepted: Orders

## 2020-02-29 NOTE — Progress Notes (Signed)
@Patient  ID: , female    DOB: 24-Sep-1975, 45 y.o.   MRN: [id number]  Chief Complaint  Patient presents with  . Consult    Covid Oct 12- still having SOB, and wheezing    Referring provider: Oct 14, NP  HPI:   45 year old whom we are seeing in consultation for evaluation of ongoing dyspnea on exertion following COVID-19 infection 10/2019.  Notes from referring provider reviewed.  Most recent PCP note reviewed cardiology note reviewed.  She had significant Covid symptoms 10/2019.  Is included dyspnea on exertion: Fatigue, cough.  Gradually mow symptoms subsided over the course of a couple weeks.  However she felt quite lousy for a while.  Spent a lot of time lying in bed, sitting around.  Would get up throughout the day to go to the bathroom, cook etc.  Unfortunately, she has had dyspnea exertion that persist.  Associate with some chest tightness.  Difficulty getting deep breath.  Dyspnea exertion seemed to worsen.  12/21 she was referred to cardiology for evaluation with no exertion.  Taken history concern for PE was present.  CTA chest obtained as outpatient on reviewing my interpretation reveals clear parenchyma bilaterally without infiltrate, groundglass, effusion, pneumothorax with small right lower lobe pulmonary embolus.  She was started on apixaban anticoagulation.  She continues on this, denies missing a dose.  She is been treated several rounds of prednisone, antibiotics.  Seems her prednisone helps some but only temporarily.  In terms of timing, symptoms seem a bit worse in the evening.  She denies any finding environmental triggers such as at work, home, etc. that make things better or worse.  She is unsure if seasonal changes have influenced the symptoms.  No other alleviating or exacerbating factors.  TTE 12/2019 reviewed with normal LV, normal RV, normal RV size as well as normal RA size, mild LA dilation.  PMH: Diabetes, allergies, sleep apnea, PE provoked  in setting of Covid 12/2019 Social history cholecystectomy, breast reduction Family history: First-degree relatives with emphysema, coronary artery disease Social history: Never smoker, lives in [place], school teacher in [place] / Pulmonary Flowsheets:   ACT:  No flowsheet data found.  MMRC: No flowsheet data found.  Epworth:  No flowsheet data found.  Tests:   FENO:  No results found for: NITRICOXIDE  PFT: No flowsheet data found.  WALK:  No flowsheet data found.  Imaging: Personally reviewed and as per EMR and discussion of this note  Lab Results: Personally reviewed, eosinophils elevated to 200 in the past CBC    Component Value Date/Time   WBC 7.9 01/06/2019 1031   WBC 9.5 02/02/2016 0750   RBC 4.36 01/06/2019 1031   RBC 4.70 02/02/2016 0750   HGB 12.3 01/06/2019 1031   HCT 36.8 01/06/2019 1031   PLT 332 01/06/2019 1031   MCV 84 01/06/2019 1031   MCH 28.2 01/06/2019 1031   MCHC 33.4 01/06/2019 1031   MCHC 31.2 02/02/2016 0750   RDW 13.2 01/06/2019 1031   LYMPHSABS 1.9 01/06/2019 1031   MONOABS 1.0 02/02/2016 0750   EOSABS 0.2 01/06/2019 1031   BASOSABS 0.1 01/06/2019 1031    BMET    Component Value Date/Time   NA 134 12/11/2019 1143   K 4.7 12/11/2019 1143   CL 98 12/11/2019 1143   CO2 25 12/11/2019 1143   GLUCOSE 371 (H) 12/11/2019 1143   GLUCOSE 194 (H) 09/03/2017 1304   BUN 11 12/11/2019 1143   CREATININE 0.59 12/11/2019  1143   CALCIUM 9.1 12/11/2019 1143   GFRNONAA 111 12/11/2019 1143   GFRAA 128 12/11/2019 1143    BNP No results found for: BNP  ProBNP    Component Value Date/Time   PROBNP 11 12/11/2019 1143    Specialty Problems      Pulmonary Problems   OSA (obstructive sleep apnea)    HST 10/2012:  AHI 61/hr with desat into 50's       Acute respiratory failure with hypoxia (HCC)   Shortness of breath      Allergies  Allergen Reactions  . Morphine     REACTION: vomiting    Immunization History   Administered Date(s) Administered  . Influenza Split 10/27/2010, 11/12/2011, 11/06/2014  . Influenza Whole 10/12/2009  . Influenza,inj,Quad PF,6+ Mos 10/22/2012, 09/25/2013, 11/08/2018, 12/28/2019  . Influenza-Unspecified 11/18/2015, 10/23/2017  . PFIZER(Purple Top)SARS-COV-2 Vaccination 05/08/2019, 06/09/2019  . PPD Test 10/27/2010  . Pneumococcal Polysaccharide-23 10/20/2009, 11/01/2015  . Td 07/07/2008    Past Medical History:  Diagnosis Date  . Depression   . Diabetes mellitus   . Hyperlipidemia   . Hypertension   . Obesity   . PCOS (polycystic ovarian syndrome)   . Pulmonary embolism (HCC)     Tobacco History: Social History   Tobacco Use  Smoking Status Never Smoker  Smokeless Tobacco Never Used   Counseling given: Not Answered   Continue to not smoke  Outpatient Encounter Medications as of 02/18/2020  Medication Sig  . albuterol (VENTOLIN HFA) 108 (90 Base) MCG/ACT inhaler TAKE 2 PUFFS BY MOUTH EVERY 6 HOURS AS NEEDED FOR WHEEZE OR SHORTNESS OF BREATH  . cetirizine (ZYRTEC) 10 MG tablet Take 10 mg by mouth daily.  . Cholecalciferol (VITAMIN D3) 50 MCG (2000 UT) TABS Take 50 mcg by mouth daily.  . COLLAGEN PO Take by mouth.  Marland Kitchen FLUoxetine (PROZAC) 20 MG capsule TAKE 1 CAPSULE BY MOUTH EVERY DAY  . fluticasone furoate-vilanterol (BREO ELLIPTA) 200-25 MCG/INH AEPB Inhale 1 puff into the lungs daily.  . fluticasone furoate-vilanterol (BREO ELLIPTA) 200-25 MCG/INH AEPB Inhale 1 puff into the lungs daily.  . folic acid (FOLVITE) 1 MG tablet Take 1 mg by mouth daily.  Marland Kitchen glipiZIDE (GLUCOTROL) 5 MG tablet TAKE 1 TABLET (5 MG TOTAL) BY MOUTH 2 (TWO) TIMES DAILY BEFORE A MEAL.  Marland Kitchen Glucosamine HCl-MSM (GLUCOSAMINE-MSM PO) Take 1,500 mg by mouth daily.  Marland Kitchen lisinopril (ZESTRIL) 5 MG tablet TAKE ONE TABLET BY MOUTH DAILY  . meloxicam (MOBIC) 15 MG tablet TAKE 1 TABLET BY MOUTH EVERY DAY WITH FOOD  . metFORMIN (GLUCOPHAGE) 1000 MG tablet TAKE 1 TABLET BY MOUTH TWICE A DAY  .  rosuvastatin (CRESTOR) 10 MG tablet Take 1 tablet (10 mg total) by mouth daily.  . Turmeric Curcumin 500 MG CAPS Take 3 capsules by mouth daily.   Marland Kitchen apixaban (ELIQUIS) 5 MG TABS tablet Take 1 tablet (5 mg total) by mouth 2 (two) times daily. (Patient not taking: Reported on 02/18/2020)  . APIXABAN (ELIQUIS) VTE STARTER PACK (10MG  AND 5MG ) Take as directed on package: start with two-5mg  tablets twice daily for 7 days. On day 8, switch to one-5mg  tablet twice daily. (Patient not taking: Reported on 02/18/2020)   No facility-administered encounter medications on file as of 02/18/2020.     Review of Systems  Review of Systems  No chest pain with exertion.  No orthopnea or PND.  No lower extremity swelling.  Comprehensive review of systems otherwise negative. Physical Exam  BP 136/86 (BP Location: Left  Arm, Cuff Size: Normal)   Pulse 83   Temp (!) 97.3 F (36.3 C)   Ht 5\' 3"  (1.6 m)   Wt (!) 358 lb (162.4 kg)   SpO2 97%   BMI 63.42 kg/m   Wt Readings from Last 5 Encounters:  02/18/20 (!) 358 lb (162.4 kg)  01/12/20 (!) 342 lb 3.2 oz (155.2 kg)  12/28/19 (!) 346 lb 8 oz (157.2 kg)  12/21/19 (!) 344 lb 12.8 oz (156.4 kg)  12/11/19 (!) 343 lb (155.6 kg)    BMI Readings from Last 5 Encounters:  02/18/20 63.42 kg/m  01/12/20 58.74 kg/m  12/28/19 65.47 kg/m  12/21/19 65.15 kg/m  12/11/19 58.88 kg/m     Physical Exam General: Well-appearing, sitting in exam chair Eyes: EOMI, no icterus Neck: Supple, JVD not appreciated Pulmonary: Clear to auscultation bilaterally, no wheezing Cardiovascular: Regular rhythm, no murmurs Abdomen: Nondistended, bowel sounds present MSK: No synovitis, no joint effusion Neuro: Normal gait, no weakness Psych: Normal mood, full affect   Assessment & Plan:   DOE: Suspect multifactorial related to deconditioning, obesity, post Covid viral syndrome, pulmonary embolus, and contribution of asthma.  Reviewed imaging which shows clear parenchyma after  Covid which is encouraging that lung tissue itself is healthy and likely functioning well.  Will obtain PFTs in the coming weeks for further evaluation.  Asthma: Suspected based on history of atopic symptoms, triggered by Covid/post viral syndrome since fall 2021.  We will start with high-dose Breo.  Will obtain PFTs above.  Pulmonary embolus: Suspect provoked in setting of COVID-19 infection.  Will plan for 3 months of anticoagulation.  She is following up with her cardiologist who diagnosed pulmonary embolus for discussion of length of therapy but sounds like tentative plan is for 3 months.   Return in about 7 weeks (around 04/06/2020).  Plan for PFTs then televisit the following week to discuss results and decide next steps based on symptoms, response to therapy as above.   06/06/2020, MD 02/29/2020

## 2020-03-01 NOTE — Telephone Encounter (Signed)
Pharmacy requests refill on: Albuterol 108 mcg/act Inhaler   LAST REFILL: 11/18/2019 (Q-6.7, R-2) LAST OV: 12/28/2019 NEXT OV: Not Scheduled  PHARMACY: [id number] [place], [place]

## 2020-03-07 ENCOUNTER — Ambulatory Visit: Payer: BC Managed Care – PPO | Attending: Nurse Practitioner

## 2020-03-07 ENCOUNTER — Other Ambulatory Visit: Payer: Self-pay

## 2020-03-07 DIAGNOSIS — R262 Difficulty in walking, not elsewhere classified: Secondary | ICD-10-CM | POA: Diagnosis present

## 2020-03-07 DIAGNOSIS — M6281 Muscle weakness (generalized): Secondary | ICD-10-CM | POA: Diagnosis present

## 2020-03-08 NOTE — Therapy (Signed)
[facility] [address], [place], [address] Phone: [phone]   Fax:  [phone]  Physical Therapy Treatment  Patient Details  Name: Kristy Conrad MRN: [id number] Date of Birth: Mar 09, 1975 No data recorded  Encounter Date: 03/07/2020   PT End of Session - 03/07/20 1736    Visit Number 2    Number of Visits 17    Date for PT Re-Evaluation 04/25/20    PT Start Time 1730    PT Stop Time 1815    PT Time Calculation (min) 45 min    Activity Tolerance Patient tolerated treatment well;Patient limited by fatigue    Behavior During Therapy [facility] for tasks assessed/performed           Past Medical History:  Diagnosis Date  . Depression   . Diabetes mellitus   . Hyperlipidemia   . Hypertension   . Obesity   . PCOS (polycystic ovarian syndrome)   . Pulmonary embolism [facility])     Past Surgical History:  Procedure Laterality Date  . BREAST SURGERY     breast reduction  . CHOLECYSTECTOMY    . WISDOM TOOTH EXTRACTION      There were no vitals filed for this visit.   Subjective Assessment - 03/07/20 1730    Subjective Patient states increased fatigue that has been about the same since the evaluation. Patient states she is trying not to take a leav of absence.    Limitations Standing;Walking;House hold activities    How long can you sit comfortably? unlimited    How long can you stand comfortably? 10 min    How long can you walk comfortably? 3 min    Diagnostic tests CT scan, XRAY    Currently in Pain? No/denies    Pain Onset More than a month ago           TREATMENT Therapeutic Exercise Heel raises in standing - 2 x10  Hip abduction in sidelying  -- 3 x10  Hip clamshells - 3 x10  Bridges in hooklying - 3 x 10  SLR - 3 x 10  Walking program - 2 min x 3 HR: 145 SpO2: 88% (at the lowest) Nustep level 1 - 2 min  Performed exercises to improve strength and cardiovascular endurance          PT Education - 03/07/20 1732    Education Details form/technique with exercise    Person(s) Educated Patient    Methods Explanation;Demonstration    Comprehension Verbalized understanding;Returned demonstration            PT Short Term Goals - 02/29/20 1145      PT SHORT TERM GOAL #1   Title Patient will show independence with HEP to improve cardiovascular ability and LE strength    Baseline 02/29/20- dependent    Time 3    Period Weeks    Status New    Target Date 03/21/20             PT Long Term Goals - 02/29/20 1149      PT LONG TERM GOAL #1   Title Patient will improve by 351ft to show improved cardivascular ability    Baseline 02/29/20- 673 ft    Time 6    Period Weeks    Status New    Target Date 04/11/20      PT LONG TERM GOAL #2   Title Patient will improve performance in 30s sit to stand  test to 15 reps show improved LE strength    Baseline 02/29/20- 9reps    Time 6    Period Weeks    Status New    Target Date 04/11/20      PT LONG TERM GOAL #3   Title Patient will be able to walk for 15 min without taking a break to show improved cardiavasuclar function    Baseline 02/29/20- 3 min    Time 6    Period Weeks    Status New    Target Date 04/11/20      PT LONG TERM GOAL #4   Title FOTO goal                 Plan - 03/07/20 1740    Clinical Impression Statement Continued to focus on improving cardiovascular endurance as well as hip strength to lend for further improvement with walking performance. Patient demonstrates O2 desaturdation and early onset of fatigue after ambulation of ~2.5 min. Also reaches ~80% of estimated max HR which further imdicates cardiovascular dysfunction. Patient with increased knee pain which also limits availability of motion. Patient will benefit from further skilled therapy to improve functional capacity.    Personal Factors and Comorbidities Comorbidity 2    Comorbidities Obesity, Diabetes    Examination-Activity  Limitations Squat;Stairs;Stand;Other;Transfers;Locomotion Level    Examination-Participation Restrictions Occupation;Other    Stability/Clinical Decision Making Evolving/Moderate complexity    Rehab Potential Good    PT Frequency 2x / week    PT Duration 8 weeks    PT Treatment/Interventions ADLs/Self Care Home Management;Cryotherapy;Electrical Stimulation;Moist Heat;Ultrasound;Gait training;Stair training;Functional mobility training;Therapeutic activities;Therapeutic exercise;Balance training;Neuromuscular re-education;Manual techniques;Passive range of motion;Energy conservation;Spinal Manipulations;Joint Manipulations    PT Next Visit Plan Check balance, Hip IR/ER strength, LE strengthening    Consulted and Agree with Plan of Care Patient           Patient will benefit from skilled therapeutic intervention in order to improve the following deficits and impairments:  Abnormal gait,Cardiopulmonary status limiting activity,Decreased activity tolerance,Decreased balance,Decreased coordination,Decreased endurance,Decreased mobility,Decreased range of motion,Decreased strength,Difficulty walking,Hypomobility,Pain  Visit Diagnosis: Muscle weakness (generalized)  Difficulty in walking, not elsewhere classified     Problem List Patient Active Problem List   Diagnosis Date Noted  . Pulmonary embolism (HCC) 12/28/2019  . Postural dizziness with presyncope 12/03/2019  . History of COVID-19 11/03/2019  . Shortness of breath 11/03/2019  . Acute respiratory failure with hypoxia (HCC) 11/03/2019  . Generalized anxiety disorder 02/23/2019  . Sunburn 08/21/2018  . Chronic pain of right knee 03/27/2017  . Transient adjustment reaction with anxiety 05/02/2015  . Edema 01/19/2013  . OSA (obstructive sleep apnea) 09/15/2012  . Obesity, morbid, BMI 50 or higher (HCC) 11/12/2011  . 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 POLYCYSTIC OVARIES 07/08/2009  . DEPRESSION, MAJOR,  RECURRENT, MODERATE 07/08/2009    Myrene Galas, PT DPT 03/08/2020, 12:47 PM  [facility] PHYSICAL AND SPORTS MEDICINE [address], [place], [address] Phone: [phone]   Fax:  [phone]  Name: Kristy Conrad MRN: [id number] Date of Birth: 29-Oct-1975

## 2020-03-09 ENCOUNTER — Ambulatory Visit: Payer: BC Managed Care – PPO

## 2020-03-10 ENCOUNTER — Ambulatory Visit: Payer: BC Managed Care – PPO | Admitting: Cardiovascular Disease

## 2020-03-14 ENCOUNTER — Ambulatory Visit: Payer: BC Managed Care – PPO

## 2020-03-15 ENCOUNTER — Ambulatory Visit: Payer: BC Managed Care – PPO | Admitting: Cardiovascular Disease

## 2020-03-17 ENCOUNTER — Ambulatory Visit: Payer: BC Managed Care – PPO

## 2020-03-17 ENCOUNTER — Other Ambulatory Visit: Payer: Self-pay

## 2020-03-17 DIAGNOSIS — M6281 Muscle weakness (generalized): Secondary | ICD-10-CM | POA: Diagnosis not present

## 2020-03-17 DIAGNOSIS — R262 Difficulty in walking, not elsewhere classified: Secondary | ICD-10-CM

## 2020-03-17 NOTE — Therapy (Signed)
[facility] PHYSICAL AND SPORTS MEDICINE [address], [place], [address] Phone: [phone]   Fax:  [phone]  Physical Therapy Treatment  Patient Details  Name: Kristy Conrad MRN: [id number] Date of Birth: [birth year] No data recorded  Encounter Date: 03/17/2020   PT End of Session - 03/17/20 1715    Visit Number 3    Number of Visits 17    Date for PT Re-Evaluation 04/25/20    PT Start Time 1645    PT Stop Time 1730    PT Time Calculation (min) 45 min    Activity Tolerance Patient tolerated treatment well;Patient limited by fatigue    Behavior During Therapy [facility] for tasks assessed/performed           Past Medical History:  Diagnosis Date  . Depression   . Diabetes mellitus   . Hyperlipidemia   . Hypertension   . Obesity   . PCOS (polycystic ovarian syndrome)   . Pulmonary embolism [facility])     Past Surgical History:  Procedure Laterality Date  . BREAST SURGERY     breast reduction  . CHOLECYSTECTOMY    . WISDOM TOOTH EXTRACTION      There were no vitals filed for this visit.   Subjective Assessment - 03/17/20 1650    Subjective Patient states increased pain today 2-3/10 "all over" in the LEs B.    Limitations Standing;Walking;House hold activities    How long can you sit comfortably? unlimited    How long can you stand comfortably? 10 min    How long can you walk comfortably? 3 min    Diagnostic tests CT scan, XRAY    Currently in Pain? No/denies    Pain Onset More than a month ago              TREATMENT Therapeutic Exercise Walking program - 30 sec, 30 sec, 30sec  HR: 145 SpO2: 90% (at the lowest) SLR - 4 x 10  Bridges in hooklying - 3 x 10  Hip abduction in sidelying  -- 4 x10  Hip clamshells - 3 x10  Heel raises in standing - 3 x10  Hip extension in standing - 3 x 10   Performed exercises to address endurance     PT Education - 03/17/20 1715    Education Details  form/technique with exercise    Person(s) Educated Patient    Methods Explanation;Demonstration    Comprehension Verbalized understanding;Returned demonstration            PT Short Term Goals - 02/29/20 1145      PT SHORT TERM GOAL #1   Title Patient will show independence with HEP to improve cardiovascular ability and LE strength    Baseline 02/29/20- dependent    Time 3    Period Weeks    Status New    Target Date 03/21/20             PT Long Term Goals - 02/29/20 1149      PT LONG TERM GOAL #1   Title Patient will improve by 322ft to show improved cardivascular ability    Baseline 02/29/20- 673 ft    Time 6    Period Weeks    Status New    Target Date 04/11/20      PT LONG TERM GOAL #2   Title Patient will improve performance in 30s sit to stand test to 15 reps show improved LE strength  Baseline 02/29/20- 9reps    Time 6    Period Weeks    Status New    Target Date 04/11/20      PT LONG TERM GOAL #3   Title Patient will be able to walk for 15 min without taking a break to show improved cardiavasuclar function    Baseline 02/29/20- 3 min    Time 6    Period Weeks    Status New    Target Date 04/11/20      PT LONG TERM GOAL #4   Title FOTO goal                 Plan - 03/17/20 1717    Clinical Impression Statement Improvement in cardivascular endurance noted today as she was able to perform greater amount of ambulation before onset of fatigue with ability to walk for 2.5 min which is an improvement over the 2 minutes from the previous session. Patient continues to desat after ~ 2 min of time but rebounds after ~ 60 sec. Patient will benefit from further skilled therapy to return to prior level of function.    Personal Factors and Comorbidities Comorbidity 2    Comorbidities Obesity, Diabetes    Examination-Activity Limitations Squat;Stairs;Stand;Other;Transfers;Locomotion Level    Examination-Participation Restrictions Occupation;Other     Stability/Clinical Decision Making Evolving/Moderate complexity    Rehab Potential Good    PT Frequency 2x / week    PT Duration 8 weeks    PT Treatment/Interventions ADLs/Self Care Home Management;Cryotherapy;Electrical Stimulation;Moist Heat;Ultrasound;Gait training;Stair training;Functional mobility training;Therapeutic activities;Therapeutic exercise;Balance training;Neuromuscular re-education;Manual techniques;Passive range of motion;Energy conservation;Spinal Manipulations;Joint Manipulations    PT Next Visit Plan Check balance, Hip IR/ER strength, LE strengthening    Consulted and Agree with Plan of Care Patient           Patient will benefit from skilled therapeutic intervention in order to improve the following deficits and impairments:  Abnormal gait,Cardiopulmonary status limiting activity,Decreased activity tolerance,Decreased balance,Decreased coordination,Decreased endurance,Decreased mobility,Decreased range of motion,Decreased strength,Difficulty walking,Hypomobility,Pain  Visit Diagnosis: Difficulty in walking, not elsewhere classified  Muscle weakness (generalized)     Problem List Patient Active Problem List   Diagnosis Date Noted  . Pulmonary embolism (HCC) 12/28/2019  . Postural dizziness with presyncope 12/03/2019  . History of COVID-19 11/03/2019  . Shortness of breath 11/03/2019  . Acute respiratory failure with hypoxia (HCC) 11/03/2019  . Generalized anxiety disorder 02/23/2019  . Sunburn 08/21/2018  . Chronic pain of right knee 03/27/2017  . Transient adjustment reaction with anxiety 05/02/2015  . Edema 01/19/2013  . OSA (obstructive sleep apnea) 09/15/2012  . Obesity, morbid, BMI 50 or higher (HCC) 11/12/2011  . 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 POLYCYSTIC OVARIES 07/08/2009  . DEPRESSION, MAJOR, RECURRENT, MODERATE 07/08/2009    Myrene Galas, PT DPT 03/17/2020, 5:35 PM  [place] [facility]  PHYSICAL AND SPORTS MEDICINE [address], [place], [address] Phone: [phone]   Fax:  [phone]  Name: JEANELL MANGAN MRN: [id number] Date of Birth: December 08, 1975

## 2020-03-21 ENCOUNTER — Ambulatory Visit: Payer: BC Managed Care – PPO

## 2020-03-21 ENCOUNTER — Other Ambulatory Visit: Payer: Self-pay

## 2020-03-21 DIAGNOSIS — R262 Difficulty in walking, not elsewhere classified: Secondary | ICD-10-CM

## 2020-03-21 DIAGNOSIS — M6281 Muscle weakness (generalized): Secondary | ICD-10-CM

## 2020-03-21 NOTE — Therapy (Unsigned)
[facility] PHYSICAL AND SPORTS MEDICINE [address], [place], [address] Phone: [phone]   Fax:  [phone]  Physical Therapy Treatment  Patient Details  Name: Kristy Conrad MRN: [id number] Date of Birth: 10-07-1975 No data recorded  Encounter Date: 03/21/2020   PT End of Session - 03/21/20 1736    Visit Number 4    Number of Visits 17    Date for PT Re-Evaluation 04/25/20    PT Start Time 1645    PT Stop Time 1730    PT Time Calculation (min) 45 min    Activity Tolerance Patient tolerated treatment well    Behavior During Therapy [facility] for tasks assessed/performed           Past Medical History:  Diagnosis Date  . Depression   . Diabetes mellitus   . Hyperlipidemia   . Hypertension   . Obesity   . PCOS (polycystic ovarian syndrome)   . Pulmonary embolism [facility])     Past Surgical History:  Procedure Laterality Date  . BREAST SURGERY     breast reduction  . CHOLECYSTECTOMY    . WISDOM TOOTH EXTRACTION      There were no vitals filed for this visit.   Subjective Assessment - 03/21/20 1734    Subjective Patient states that she has no current increase in pain.    Limitations Standing;Walking;House hold activities    How long can you sit comfortably? unlimited    How long can you stand comfortably? 10 min    How long can you walk comfortably? 3 min    Diagnostic tests CT scan, XRAY    Currently in Pain? No/denies    Pain Onset More than a month ago            TREATMENT Therapeutic Exercise Walking program - 30 sec, 30 sec, 3 min,  HR: 145 SpO2: 89% (at the lowest) SLR - 3 x 10 Bridges in hooklying - 3 x 10  Hip abduction in sidelying  -- 4 x10  Hip clamshells - 3 x10  Heel raises in standing - 3 x10  Hip extension in standing - 1 x 10  Hip extension in standing RTB 3 x 10   Performed exercises to address endurance      PT Education - 03/21/20 1735    Education Details form/technique  with exercise    Person(s) Educated Patient    Methods Explanation;Demonstration    Comprehension Returned demonstration;Verbalized understanding            PT Short Term Goals - 02/29/20 1145      PT SHORT TERM GOAL #1   Title Patient will show independence with HEP to improve cardiovascular ability and LE strength    Baseline 02/29/20- dependent    Time 3    Period Weeks    Status New    Target Date 03/21/20             PT Long Term Goals - 02/29/20 1149      PT LONG TERM GOAL #1   Title Patient will improve by 393ft to show improved cardivascular ability    Baseline 02/29/20- 673 ft    Time 6    Period Weeks    Status New    Target Date 04/11/20      PT LONG TERM GOAL #2   Title Patient will improve performance in 30s sit to stand test to 15 reps show improved LE  strength    Baseline 02/29/20- 9reps    Time 6    Period Weeks    Status New    Target Date 04/11/20      PT LONG TERM GOAL #3   Title Patient will be able to walk for 15 min without taking a break to show improved cardiavasuclar function    Baseline 02/29/20- 3 min    Time 6    Period Weeks    Status New    Target Date 04/11/20      PT LONG TERM GOAL #4   Title FOTO goal                 Plan - 03/21/20 1738    Clinical Impression Statement Patient demonstrates an increase in cardiovascular endurance during 2.5 minute walk and was able to increase time to 3 minutes on the last set. However, patient did need to take frequent rest breaks during walk. Patient will benefit from skilled physical therapy to increase endurance and return to prior level of function.    Personal Factors and Comorbidities Comorbidity 2    Comorbidities Obesity, Diabetes    Examination-Activity Limitations Squat;Stairs;Stand;Other;Transfers;Locomotion Level    Examination-Participation Restrictions Occupation;Other    Stability/Clinical Decision Making Evolving/Moderate complexity    Rehab Potential Good    PT  Frequency 2x / week    PT Duration 8 weeks    PT Treatment/Interventions ADLs/Self Care Home Management;Cryotherapy;Electrical Stimulation;Moist Heat;Ultrasound;Gait training;Stair training;Functional mobility training;Therapeutic activities;Therapeutic exercise;Balance training;Neuromuscular re-education;Manual techniques;Passive range of motion;Energy conservation;Spinal Manipulations;Joint Manipulations    PT Next Visit Plan Check balance, Hip IR/ER strength, LE strengthening    Consulted and Agree with Plan of Care Patient           Patient will benefit from skilled therapeutic intervention in order to improve the following deficits and impairments:  Abnormal gait,Cardiopulmonary status limiting activity,Decreased activity tolerance,Decreased balance,Decreased coordination,Decreased endurance,Decreased mobility,Decreased range of motion,Decreased strength,Difficulty walking,Hypomobility,Pain  Visit Diagnosis: Muscle weakness (generalized)  Difficulty in walking, not elsewhere classified     Problem List Patient Active Problem List   Diagnosis Date Noted  . Pulmonary embolism (HCC) 12/28/2019  . Postural dizziness with presyncope 12/03/2019  . History of COVID-19 11/03/2019  . Shortness of breath 11/03/2019  . Acute respiratory failure with hypoxia (HCC) 11/03/2019  . Generalized anxiety disorder 02/23/2019  . Sunburn 08/21/2018  . Chronic pain of right knee 03/27/2017  . Transient adjustment reaction with anxiety 05/02/2015  . Edema 01/19/2013  . OSA (obstructive sleep apnea) 09/15/2012  . Obesity, morbid, BMI 50 or higher (HCC) 11/12/2011  . 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 POLYCYSTIC OVARIES 07/08/2009  . DEPRESSION, MAJOR, RECURRENT, MODERATE 07/08/2009    Beatris Si 03/21/2020, 5:48 PM  [place] [facility] PHYSICAL AND SPORTS MEDICINE [address] [place], [address] Phone: [phone]   Fax:   [phone]  Name: Kristy Conrad MRN: [id number] Date of Birth: [birth year]

## 2020-03-27 NOTE — Progress Notes (Signed)
Cardiology Office Note:   Date:  03/30/2020  NAME:  Kristy Conrad    MRN: [id number] DOB:  [birth year]   PCP:  Lynnda Child, MD  Cardiologist:  No primary care provider on file.  Electrophysiologist:  None   Referring MD: Lynnda Child, MD   Chief Complaint  Patient presents with  . Shortness of Breath   History of Present Illness:   Kristy Conrad is a 45 y.o. female with a hx of morbid obesity, BMI 59, diabetes that is uncontrolled, hyperlipidemia, OSA, PE who presents for follow-up.  She reports she only took 2 weeks of Eliquis.  She did not complete her 53-month course.  Cost was an issue.  She reports her shortness of breath is slowly improving.  She denies any significant chest pain symptoms today.  Her weight remains the same.  She is going to get back to see a nutritionist.  She has seen pulmonary.  Pulmonary function testing has been completed but report is not finalized.  She will follow-up on this next week.  She also will see sleep medicine.  She does need reprogramming of her machine versus mass titration.  She has normal blood pressure today.  Denies any major symptoms in office.  Her A1c per her report is down to 8.6.  Seems to be doing better.  We do need to get her on Eliquis for a 38-month course.  Samples were given today.  Problem List 1. Obesity -347 lbs -BMI 59 2. DM -A1c 8.6 -coronary calcium score = 0  3. HLD -T chol 128, HDL 38, LDL 70, TG 108 4. OSA 5. PE -12/11/2019 -10/13/2019 -> Covid 19  Past Medical History: Past Medical History:  Diagnosis Date  . Depression   . Diabetes mellitus   . Hyperlipidemia   . Hypertension   . Obesity   . PCOS (polycystic ovarian syndrome)   . Pulmonary embolism [facility])     Past Surgical History: Past Surgical History:  Procedure Laterality Date  . BREAST SURGERY     breast reduction  . CHOLECYSTECTOMY    . WISDOM TOOTH EXTRACTION      Current Medications: Current Meds  Medication Sig  . albuterol  (VENTOLIN HFA) 108 (90 Base) MCG/ACT inhaler TAKE 2 PUFFS BY MOUTH EVERY 6 HOURS AS NEEDED FOR WHEEZE OR SHORTNESS OF BREATH  . cetirizine (ZYRTEC) 10 MG tablet Take 10 mg by mouth daily.  . Cholecalciferol (VITAMIN D3) 50 MCG (2000 UT) TABS Take 50 mcg by mouth daily.  . COLLAGEN PO Take by mouth.  . diclofenac (VOLTAREN) 75 MG EC tablet Take 1 tablet (75 mg total) by mouth 2 (two) times daily.  Marland Kitchen FLUoxetine (PROZAC) 20 MG capsule TAKE 1 CAPSULE BY MOUTH EVERY DAY  . fluticasone furoate-vilanterol (BREO ELLIPTA) 200-25 MCG/INH AEPB Inhale 1 puff into the lungs daily.  . folic acid (FOLVITE) 1 MG tablet Take 1 mg by mouth daily.  Marland Kitchen glipiZIDE (GLUCOTROL) 5 MG tablet TAKE 1 TABLET (5 MG TOTAL) BY MOUTH 2 (TWO) TIMES DAILY BEFORE A MEAL.  Marland Kitchen Glucosamine HCl-MSM (GLUCOSAMINE-MSM PO) Take 1,500 mg by mouth daily.  Marland Kitchen lisinopril (ZESTRIL) 5 MG tablet TAKE ONE TABLET BY MOUTH DAILY  . metFORMIN (GLUCOPHAGE) 1000 MG tablet TAKE 1 TABLET BY MOUTH TWICE A DAY  . Turmeric Curcumin 500 MG CAPS Take 3 capsules by mouth daily.   . [DISCONTINUED] apixaban (ELIQUIS) 5 MG TABS tablet Take 1 tablet (5 mg total) by mouth 2 (two) times  daily.     Allergies:    Morphine   Social History: Social History   Socioeconomic History  . Marital status: Single    Spouse name: Not on file  . Number of children: 0  . Years of education: masters in education  . Highest education level: Not on file  Occupational History  . Occupation: Magazine features editor: [employer]  Tobacco Use  . Smoking status: Never Smoker  . Smokeless tobacco: Never Used  Substance and Sexual Activity  . Alcohol use: Yes    Comment: 1-2 times per month  . Drug use: No  . Sexual activity: Not Currently    Partners: Male  Other Topics Concern  . 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   Diet: not good, does not follow diabetic diet      Safety   Seat belts: Yes    Guns: No   Safe in relationships: Yes    Social Determinants of Corporate investment banker Strain: Not on file  Food Insecurity: Not on file  Transportation Needs: Not on file  Physical Activity: Not on file  Stress: Not on file  Social Connections: Not on file     Family History: The patient's family history includes Cancer in her paternal grandfather; Cancer (age of onset: 32) in her mother; Emphysema in her maternal grandfather and paternal grandfather; Heart attack (age of onset: 17) in her father; Heart disease in her maternal grandmother; Lung cancer in her maternal grandfather.  ROS:   All other ROS reviewed and negative. Pertinent positives noted in the HPI.     EKGs/Labs/Other Studies Reviewed:   The following studies were personally reviewed by me today:  TTE 12/16/2019 1. Left ventricular ejection fraction, by estimation, is 55 to 60%. The  left ventricle has normal function. The left ventricle has no regional  wall motion abnormalities. Left ventricular diastolic parameters were  normal.  2. Right ventricular systolic function is normal. The right ventricular  size is normal.  3. Left atrial size was moderately dilated.  4. The mitral valve is normal in structure. No evidence of mitral valve  regurgitation. No evidence of mitral stenosis.  5. The aortic valve is normal in structure. Aortic valve regurgitation is  not visualized. No aortic stenosis is present.  6. Aortic dilatation noted. There is mild dilatation at the level of the  sinuses of Valsalva, measuring 40 mm.  7. The  inferior vena cava is normal in size with greater than 50%  respiratory variability, suggesting right atrial pressure of 3 mmHg.   Recent Labs: 12/11/2019: BUN 11; Creatinine, Ser 0.59; NT-Pro BNP 11; Potassium 4.7; Sodium 134; TSH 1.210   Recent Lipid Panel    Component Value Date/Time   CHOL 128 01/06/2019 1031   TRIG 108 01/06/2019 1031   HDL 38 (L) 01/06/2019 1031   CHOLHDL 3.4 01/06/2019 1031   CHOLHDL 3 09/03/2017 1304  VLDL 20.6 09/03/2017 1304   LDLCALC 70 01/06/2019 1031    Physical Exam:   VS:  BP 134/81   Pulse 78   Ht 5' 3.75" (1.619 m)   Wt (!) 346 lb (156.9 kg)   SpO2 97%   BMI 59.86 kg/m    Wt Readings from Last 3 Encounters:  03/30/20 (!) 346 lb (156.9 kg)  03/29/20 (!) 346 lb 8 oz (157.2 kg)  02/18/20 (!) 358 lb (162.4 kg)    General: Obese female, no acute distress Head: Atraumatic, normal size  Eyes: PEERLA, EOMI  Neck: Supple, no JVD Endocrine: No thryomegaly Cardiac: Normal S1, S2; RRR; no murmurs, rubs, or gallops Lungs: Clear to auscultation bilaterally, no wheezing, rhonchi or rales  Abd: Soft, nontender, no hepatomegaly  Ext: No edema, pulses 2+ Musculoskeletal: No deformities, BUE and BLE strength normal and equal Skin: Warm and dry, no rashes   Neuro: Alert and oriented to person, place, time, and situation, CNII-XII grossly intact, no focal deficits  Psych: Normal mood and affect   ASSESSMENT:   Kristy Conrad is a 45 y.o. female who presents for the following: 1. Other pulmonary embolism without acute cor pulmonale, unspecified chronicity (HCC)   2. SOB (shortness of breath) on exertion   3. Mixed hyperlipidemia   4. Obesity, morbid, BMI 50 or higher (HCC)   5. OSA (obstructive sleep apnea)     PLAN:   1. Other pulmonary embolism without acute cor pulmonale, unspecified chronicity (HCC) -Seen in the office on 12/11/2019 with shortness of breath from COVID.  Covid was diagnosed in October 2021.  Found to have a pulmonary embolism.   She did not complete 3 months of therapy.  She only completed 2 weeks.  We need to get her back on Eliquis to complete a 6184-month course. -She does understand the importance of this.  We will send a refill to a different pharmacy.  If we need to switch to Xarelto or Coumadin we will do this.  2. SOB (shortness of breath) on exertion -Symptoms of shortness of breath are slowly improving.  I suspect this was related to COVID-19 as well as a PE.  Treatment for PE as above.  Recent echocardiogram shows normal LV function with normal diastolic parameters.  She has no evidence of congestive heart failure.  She will follow-up with pulmonary regarding pulmonary function test.  Overall, I suspect her symptoms are related to deconditioning in setting of morbid obesity.  She has 0 coronary calcium on a CT PE study.  I have a low suspicion for obstructive CAD.  Symptoms are improving.  3. Mixed hyperlipidemia -Continue Crestor 10 mg daily.  Given her diabetes LDL cholesterol should be less than 70.  Is 70.  4. Obesity, morbid, BMI 50 or higher (HCC) -Diet and exercise recommended.  5. OSA (obstructive sleep apnea) -She will follow up with sleep medicine.  Needs further titration or reevaluation of her CPAP machine.  Disposition: Return in about 3 months (around 06/30/2020).  Medication Adjustments/Labs and Tests Ordered: Current medicines are reviewed at length with the patient today.  Concerns regarding medicines are outlined above.  No orders of the defined types were placed in this encounter.  Meds ordered this encounter  Medications  . DISCONTD: apixaban (ELIQUIS) 5 MG TABS tablet    Sig: Take 1 tablet (5 mg total) by mouth 2 (two) times daily.    Dispense:  60 tablet    Refill:  3  . apixaban (ELIQUIS) 5  MG TABS tablet    Sig: Take 1 tablet (5 mg total) by mouth 2 (two) times daily.    Dispense:  60 tablet    Refill:  3    Patient Instructions  Medication Instructions:  The current medical  regimen is effective;  continue present plan and medications. (please let us know if you can not get the Eliquis, we can help with patient assistance options)  *If you need a refill on your cardiac medications before your next appointment, please call your pharmacy*  Follow-Up: At [facility], you and your health needs are our priority.  As part of our continuing mission to provide you with exceptional heart care, we have created designated Provider Care Teams.  These Care Teams include your primary Cardiologist (physician) and Advanced Practice Providers (APPs -  Physician Assistants and Nurse Practitioners) who all work together to provide you with the care you need, when you need it.  We recommend signing up for the patient portal called "MyChart".  Sign up information is provided on this After Visit Summary.  MyChart is used to connect with patients for Virtual Visits (Telemedicine).  Patients are able to view lab/test results, encounter notes, upcoming appointments, etc.  Non-urgent messages can be sent to your provider as well.   To learn more about what you can do with MyChart, go to [email or web address].    Your next appointment:   3 month(s)  The format for your next appointment:   In Person  Provider:   Lennie Odor, MD         Time Spent with Patient: I have spent a total of 25 minutes with patient reviewing hospital notes, telemetry, EKGs, labs and examining the patient as well as establishing an assessment and plan that was discussed with the patient.  > 50% of time was spent in direct patient care.  Signed, Lenna Gilford. Flora Lipps, MD, [facility]  [facility]  [address], [address] [place], [place] [address] [phone]  03/30/2020 1:32 PM

## 2020-03-28 ENCOUNTER — Ambulatory Visit (INDEPENDENT_AMBULATORY_CARE_PROVIDER_SITE_OTHER): Payer: BC Managed Care – PPO | Admitting: Pulmonary Disease

## 2020-03-28 ENCOUNTER — Other Ambulatory Visit: Payer: Self-pay

## 2020-03-28 ENCOUNTER — Ambulatory Visit: Payer: BC Managed Care – PPO

## 2020-03-28 DIAGNOSIS — M6281 Muscle weakness (generalized): Secondary | ICD-10-CM | POA: Diagnosis not present

## 2020-03-28 DIAGNOSIS — R0600 Dyspnea, unspecified: Secondary | ICD-10-CM

## 2020-03-28 DIAGNOSIS — R059 Cough, unspecified: Secondary | ICD-10-CM

## 2020-03-28 DIAGNOSIS — R262 Difficulty in walking, not elsewhere classified: Secondary | ICD-10-CM

## 2020-03-28 DIAGNOSIS — R0609 Other forms of dyspnea: Secondary | ICD-10-CM

## 2020-03-28 LAB — PULMONARY FUNCTION TEST
DL/VA % pred: 121 %
DL/VA: 5.3 ml/min/mmHg/L
DLCO cor % pred: 95 %
DLCO cor: 20.37 ml/min/mmHg
DLCO unc % pred: 95 %
DLCO unc: 20.37 ml/min/mmHg
FEF 25-75 Post: 4.28 L/sec
FEF 25-75 Pre: 3.32 L/sec
FEF2575-%Change-Post: 28 %
FEF2575-%Pred-Post: 146 %
FEF2575-%Pred-Pre: 113 %
FEV1-%Change-Post: 6 %
FEV1-%Pred-Post: 81 %
FEV1-%Pred-Pre: 76 %
FEV1-Post: 2.36 L
FEV1-Pre: 2.22 L
FEV1FVC-%Change-Post: 1 %
FEV1FVC-%Pred-Pre: 109 %
FEV6-%Change-Post: 4 %
FEV6-%Pred-Post: 73 %
FEV6-%Pred-Pre: 70 %
FEV6-Post: 2.6 L
FEV6-Pre: 2.49 L
FEV6FVC-%Pred-Post: 102 %
FEV6FVC-%Pred-Pre: 102 %
FVC-%Change-Post: 4 %
FVC-%Pred-Post: 72 %
FVC-%Pred-Pre: 68 %
FVC-Post: 2.6 L
FVC-Pre: 2.49 L
Post FEV1/FVC ratio: 91 %
Post FEV6/FVC ratio: 100 %
Pre FEV1/FVC ratio: 89 %
Pre FEV6/FVC Ratio: 100 %
RV % pred: 72 %
RV: 1.24 L
TLC % pred: 78 %
TLC: 3.95 L

## 2020-03-28 NOTE — Progress Notes (Signed)
PFT done today. 

## 2020-03-28 NOTE — Therapy (Signed)
[place] [facility]ICINE [address], [place], [address] Phone: [phone]   Fax:  [phone]  Physical Therapy Treatment  Patient Details  Name: Kristy Conrad MRN: [id number] Date of Birth: March 11, 1975 No data recorded  Encounter Date: 03/28/2020   PT End of Session - 03/28/20 1740    Visit Number 5    Number of Visits 17    Date for PT Re-Evaluation 04/25/20    PT Start Time 1648    PT Stop Time 1730    PT Time Calculation (min) 42 min    Activity Tolerance Patient tolerated treatment well    Behavior During Therapy [facility] for tasks assessed/performed           Past Medical History:  Diagnosis Date  . Depression   . Diabetes mellitus   . Hyperlipidemia   . Hypertension   . Obesity   . PCOS (polycystic ovarian syndrome)   . Pulmonary embolism [facility])     Past Surgical History:  Procedure Laterality Date  . BREAST SURGERY     breast reduction  . CHOLECYSTECTOMY    . WISDOM TOOTH EXTRACTION      There were no vitals filed for this visit.   Subjective Assessment - 03/28/20 1739    Subjective Patient reports that she has no increase in pain. Patient states that she feels as though there is an issue with her posture. Posture was assessed and patient was cued to keep toes pointed in the direction she is going instead of outward.    Limitations Standing;Walking;House hold activities    How long can you sit comfortably? unlimited    How long can you stand comfortably? 10 min    How long can you walk comfortably? 3 min    Diagnostic tests CT scan, XRAY    Currently in Pain? No/denies    Pain Onset More than a month ago            TREATMENT Therapeutic Exercise Walking program - 30 sec, 30 sec, 3 min, HR: 140 SpO2: 89% (at the lowest) Bridges in hooklying - 3 x 10  Hip abduction in sidelying  -- 4 x10  Hip clamshells - 3 x10  Heel raises in standing - 3 x10  Hip extension in standing RTB 3  x 10   Performed exercises to address endurance      PT Education - 03/28/20 1740    Education Details form/technique with exercise    Person(s) Educated Patient    Methods Explanation;Demonstration    Comprehension Verbalized understanding;Returned demonstration            PT Short Term Goals - 02/29/20 1145      PT SHORT TERM GOAL #1   Title Patient will show independence with HEP to improve cardiovascular ability and LE strength    Baseline 02/29/20- dependent    Time 3    Period Weeks    Status New    Target Date 03/21/20             PT Long Term Goals - 02/29/20 1149      PT LONG TERM GOAL #1   Title Patient will improve by 371ft to show improved cardivascular ability    Baseline 02/29/20- 673 ft    Time 6    Period Weeks    Status New    Target Date 04/11/20      PT LONG TERM GOAL #2  Title Patient will improve performance in 30s sit to stand test to 15 reps show improved LE strength    Baseline 02/29/20- 9reps    Time 6    Period Weeks    Status New    Target Date 04/11/20      PT LONG TERM GOAL #3   Title Patient will be able to walk for 15 min without taking a break to show improved cardiavasuclar function    Baseline 02/29/20- 3 min    Time 6    Period Weeks    Status New    Target Date 04/11/20      PT LONG TERM GOAL #4   Title FOTO goal                 Plan - 03/28/20 1742    Clinical Impression Statement Patient demonstrates an increase in cardiovascular endurance and pace while ambulating during 2.5/3 minute walk activities. However, patient demonstrates some increased glute weakness while performing prolonged sidelying hip abductor exercises and clam shells. Patient will benefit from skilled physical therapy to increase strength and endurance to return to prior level of function.    Personal Factors and Comorbidities Comorbidity 2    Comorbidities Obesity, Diabetes    Examination-Activity Limitations  Squat;Stairs;Stand;Other;Transfers;Locomotion Level    Examination-Participation Restrictions Occupation;Other    Stability/Clinical Decision Making Evolving/Moderate complexity    Rehab Potential Good    PT Frequency 2x / week    PT Duration 8 weeks    PT Treatment/Interventions ADLs/Self Care Home Management;Cryotherapy;Electrical Stimulation;Moist Heat;Ultrasound;Gait training;Stair training;Functional mobility training;Therapeutic activities;Therapeutic exercise;Balance training;Neuromuscular re-education;Manual techniques;Passive range of motion;Energy conservation;Spinal Manipulations;Joint Manipulations    PT Next Visit Plan Check balance, Hip IR/ER strength, LE strengthening    Consulted and Agree with Plan of Care Patient           Patient will benefit from skilled therapeutic intervention in order to improve the following deficits and impairments:  Abnormal gait,Cardiopulmonary status limiting activity,Decreased activity tolerance,Decreased balance,Decreased coordination,Decreased endurance,Decreased mobility,Decreased range of motion,Decreased strength,Difficulty walking,Hypomobility,Pain  Visit Diagnosis: Muscle weakness (generalized)  Difficulty in walking, not elsewhere classified     Problem List Patient Active Problem List   Diagnosis Date Noted  . Pulmonary embolism (HCC) 12/28/2019  . Postural dizziness with presyncope 12/03/2019  . History of COVID-19 11/03/2019  . Shortness of breath 11/03/2019  . Acute respiratory failure with hypoxia (HCC) 11/03/2019  . Generalized anxiety disorder 02/23/2019  . Sunburn 08/21/2018  . Chronic pain of right knee 03/27/2017  . Transient adjustment reaction with anxiety 05/02/2015  . Edema 01/19/2013  . OSA (obstructive sleep apnea) 09/15/2012  . Obesity, morbid, BMI 50 or higher (HCC) 11/12/2011  . 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 POLYCYSTIC OVARIES 07/08/2009  . DEPRESSION, MAJOR, RECURRENT,  MODERATE 07/08/2009    Marden Noble 03/28/2020, 5:50 PM  [place] [facility] PHYSICAL AND SPORTS MEDICINE [address] [place], [address] Phone: [phone]   Fax:  [phone]  Name: Kristy Conrad MRN: [id number] Date of Birth: [birth year]

## 2020-03-29 ENCOUNTER — Ambulatory Visit: Payer: BC Managed Care – PPO | Admitting: Family Medicine

## 2020-03-29 VITALS — BP 130/80 | HR 78 | Temp 97.8°F | Ht 63.0 in | Wt 346.5 lb

## 2020-03-29 DIAGNOSIS — G8929 Other chronic pain: Secondary | ICD-10-CM

## 2020-03-29 DIAGNOSIS — M25559 Pain in unspecified hip: Secondary | ICD-10-CM

## 2020-03-29 DIAGNOSIS — E0822 Diabetes mellitus due to underlying condition with diabetic chronic kidney disease: Secondary | ICD-10-CM

## 2020-03-29 DIAGNOSIS — Z8616 Personal history of COVID-19: Secondary | ICD-10-CM | POA: Diagnosis not present

## 2020-03-29 DIAGNOSIS — M25561 Pain in right knee: Secondary | ICD-10-CM | POA: Diagnosis not present

## 2020-03-29 DIAGNOSIS — R0602 Shortness of breath: Secondary | ICD-10-CM

## 2020-03-29 LAB — POCT GLYCOSYLATED HEMOGLOBIN (HGB A1C): Hemoglobin A1C: 8.7 % — AB (ref 4.0–5.6)

## 2020-03-29 MED ORDER — DICLOFENAC SODIUM 75 MG PO TBEC: 75.0000 mg | DELAYED_RELEASE_TABLET | Freq: Two times a day (BID) | ORAL | 0 refills | Status: DC

## 2020-03-29 NOTE — Assessment & Plan Note (Signed)
Improvement with PT. Trial of diclofenac instead of meloxicam. Discussed risk of longterm NSAIDs. She will also schedule with sports medicine to consider steroid injection vs orthovisc.

## 2020-03-29 NOTE — Assessment & Plan Note (Addendum)
Encouraged seeing nutrition again which she will do and working to make healthy choices. Cont PT and increasing exercising as tolerated

## 2020-03-29 NOTE — Assessment & Plan Note (Addendum)
Overall she notes improvement in SOB as she continues to do more activity. However, still limited. Following with pulmology and appreciate their support.

## 2020-03-29 NOTE — Progress Notes (Signed)
Subjective:     Kristy Conrad is a 45 y.o. female presenting for Generalized Body Aches and Shortness of Breath     HPI  #Diabetes Currently taking metformin and glipizide  Using medications without difficulties: Yes  Last HgbA1c:  Lab Results  Component Value Date   HGBA1C 8.7 (A) 03/29/2020    Diabetes Health Maintenance Due:    Diabetes Health Maintenance Due  Topic Date Due  . FOOT EXAM  06/15/2020  . HEMOGLOBIN A1C  09/29/2020  . OPHTHALMOLOGY EXAM  12/21/2020    #Long haul covid symptoms - had a PFT yesterday and is going to speak with pulm next week - feels breathing is overall improving  #knee arthritis - diagnosed years ago - going to PT 1-2 times a week - is starting to notice she is not standing up straight and walking differently  - not sure if she is just more self-aware - has noticed her routine stance is to the rotate out the right leg and lean on the left leg  Micah Flesher out from work on October and was out for 3 months  Has been back for 2 months still in constant pain - having to take breaks when walking, 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   Social History   Tobacco Use  Smoking Status Never Smoker  Smokeless Tobacco Never Used        Objective:    BP Readings from Last 3 Encounters:  03/29/20 130/80  02/18/20 136/86  12/28/19 120/80   Wt Readings from Last 3 Encounters:  03/29/20 (!) 346 lb 8 oz (157.2 kg)  02/18/20 (!) 358 lb (162.4 kg)  01/12/20 (!) 342 lb 3.2 oz (155.2 kg)    BP 130/80   Pulse 78   Temp 97.8 F (36.6 C) (Temporal)   Ht 5\' 3"  (1.6 m)   Wt (!) 346 lb 8 oz (157.2 kg)   SpO2 98%   BMI 61.38 kg/m    Physical  Exam Constitutional:      General: She is not in acute distress.    Appearance: She is well-developed. She is obese. She is not diaphoretic.  HENT:     Right Ear: External ear normal.     Left Ear: External ear normal.  Eyes:     Conjunctiva/sclera: Conjunctivae normal.  Cardiovascular:     Rate and Rhythm: Normal rate and regular rhythm.  Pulmonary:     Effort: Pulmonary effort is normal. No respiratory distress.     Breath sounds: Normal breath sounds. No wheezing.  Musculoskeletal:     Cervical back: Neck supple.  Skin:    General: Skin is warm and dry.     Capillary Refill: Capillary refill takes less than 2 seconds.  Neurological:     Mental Status: She is alert. Mental status is at baseline.  Psychiatric:        Mood and Affect: Mood normal.        Behavior: Behavior normal.           Assessment & Plan:   Problem List Items Addressed This Visit      Endocrine   Diabetes  mellitus with renal complications (HCC) - Primary    Hgb A1c is improved from prior though still not at goal. She will check with insurance and hopefully be able to start ozempic or trulicity to help with weight loss. Cont metformin 1000 mg bid and glipizide 5 mg bid.       Relevant Orders   POCT glycosylated hemoglobin (Hb A1C) (Completed)     Other   Obesity, morbid, BMI 50 or higher (HCC)    Encouraged seeing nutrition again which she will do and working to make healthy choices. Cont PT and increasing exercising as tolerated      Chronic pain of right knee    Improvement with PT. Trial of diclofenac instead of meloxicam. Discussed risk of longterm NSAIDs. She will also schedule with sports medicine to consider steroid injection vs orthovisc.       Relevant Medications   diclofenac (VOLTAREN) 75 MG EC tablet   History of COVID-19    Overall she notes improvement in SOB as she continues to do more activity. However, still limited. Following with pulmology and appreciate their support.        Shortness of breath    Improving. Discussed multifactorial - hx of Covid (seeing pulm), untreated OSA (has new referral to find a mask that fits), and obesity (nutrition and working on increasing activity as tolerated).       Hip pain    New onset. Discussed possibility of PMR and getting blood work, but she has also noticed she has an abnormal stance and has a plan with her PT. Suspect MSK more likely given her abnormal stance. Work with PT and if not improving can consider blood work and further evaluation.           Return in about 3 months (around 06/29/2020).  Lynnda Child, MD  This visit occurred during the SARS-CoV-2 public health emergency.  Safety protocols were in place, including screening questions prior to the visit, additional usage of staff PPE, and extensive cleaning of exam room while observing appropriate contact time as indicated for disinfecting solutions.

## 2020-03-29 NOTE — Assessment & Plan Note (Signed)
Improving. Discussed multifactorial - hx of Covid (seeing pulm), untreated OSA (has new referral to find a mask that fits), and obesity (nutrition and working on increasing activity as tolerated).

## 2020-03-29 NOTE — Assessment & Plan Note (Signed)
Hgb A1c is improved from prior though still not at goal. She will check with insurance and hopefully be able to start ozempic or trulicity to help with weight loss. Cont metformin 1000 mg bid and glipizide 5 mg bid.

## 2020-03-29 NOTE — Assessment & Plan Note (Signed)
New onset. Discussed possibility of PMR and getting blood work, but she has also noticed she has an abnormal stance and has a plan with her PT. Suspect MSK more likely given her abnormal stance. Work with PT and if not improving can consider blood work and further evaluation.

## 2020-03-29 NOTE — Patient Instructions (Addendum)
#   Medication options:  1) GLP-1 receptor agonsit - Ozempic, Trulicity, Victoza  OR 2) SGLT-2 inhibitor - Jardiance (pill) - (most common side effect is yeast infection)  1) Call insurance to figure out tiers/preference  2) Look into medication assistance programs from the website  Make sure to get your eye exam for diabetes check up   Keep working on PT  If hip pain does not improve let me know.

## 2020-03-30 ENCOUNTER — Other Ambulatory Visit: Payer: Self-pay | Admitting: Cardiovascular Disease

## 2020-03-30 ENCOUNTER — Ambulatory Visit: Payer: BC Managed Care – PPO | Admitting: Cardiovascular Disease

## 2020-03-30 ENCOUNTER — Ambulatory Visit: Payer: BC Managed Care – PPO

## 2020-03-30 ENCOUNTER — Other Ambulatory Visit: Payer: Self-pay

## 2020-03-30 ENCOUNTER — Encounter: Payer: Self-pay | Admitting: Cardiovascular Disease

## 2020-03-30 VITALS — BP 134/81 | HR 78 | Ht 63.75 in | Wt 346.0 lb

## 2020-03-30 DIAGNOSIS — R0602 Shortness of breath: Secondary | ICD-10-CM | POA: Diagnosis not present

## 2020-03-30 DIAGNOSIS — I2699 Other pulmonary embolism without acute cor pulmonale: Secondary | ICD-10-CM | POA: Diagnosis not present

## 2020-03-30 DIAGNOSIS — R262 Difficulty in walking, not elsewhere classified: Secondary | ICD-10-CM

## 2020-03-30 DIAGNOSIS — M6281 Muscle weakness (generalized): Secondary | ICD-10-CM | POA: Diagnosis not present

## 2020-03-30 DIAGNOSIS — E782 Mixed hyperlipidemia: Secondary | ICD-10-CM

## 2020-03-30 DIAGNOSIS — G4733 Obstructive sleep apnea (adult) (pediatric): Secondary | ICD-10-CM

## 2020-03-30 MED ORDER — APIXABAN 5 MG PO TABS: 5.0000 mg | ORAL_TABLET | Freq: Two times a day (BID) | ORAL | 3 refills | Status: DC

## 2020-03-30 NOTE — Therapy (Signed)
[place] [facility] PHYS[facility]ORTS MEDICINE [address] [place], [address] Phone: [phone]   Fax:  [phone]  Physical Therapy Treatment  Patient Details  Name: Kristy Conrad MRN: [id number] Date of Birth: 24-Oct-1975 No data recorded  Encounter Date: 03/30/2020   PT End of Session - 03/30/20 1827    Visit Number 6    Number of Visits 17    Date for PT Re-Evaluation 04/25/20    PT Start Time 1815    PT Stop Time 1857    PT Time Calculation (min) 42 min    Activity Tolerance Patient tolerated treatment well    Behavior During Therapy [facility] for tasks assessed/performed           Past Medical History:  Diagnosis Date  . Depression   . Diabetes mellitus   . Hyperlipidemia   . Hypertension   . Obesity   . PCOS (polycystic ovarian syndrome)   . Pulmonary embolism [facility])     Past Surgical History:  Procedure Laterality Date  . BREAST SURGERY     breast reduction  . CHOLECYSTECTOMY    . WISDOM TOOTH EXTRACTION      There were no vitals filed for this visit.   Subjective Assessment - 03/30/20 1817    Subjective Patient states she feels fine today, still has some concerns about gait but is working on it. Patient reports she wants to make an appointment to get knee injection shots.    Limitations Standing;Walking;House hold activities    How long can you sit comfortably? unlimited    How long can you stand comfortably? 10 min    How long can you walk comfortably? 3 min    Diagnostic tests CT scan, XRAY    Currently in Pain? No/denies    Pain Onset More than a month ago           TREATMENT Therapeutic Exercise Walking program - 30 sec, 30 sec, 3 min, HR: 144 SpO2: 89% (at the lowest) Bridges in hooklying - 3 x 10  Hip abduction in sidelying  -- 4 x10  Hip clamshells - 3 x10  Heel raises in standing - 3 x10  Hip extension in standing RTB 3 x 10 Rotary hip adduction machine 25# 2 x 10  B SLR in supine w/  3# ankle weights 3 x 10  Monster walks w/ RTB 84ft x 4     Performed exercises to address LE weakness       PT Education - 03/30/20 1826    Education Details form/technique with exercise    Person(s) Educated Patient    Methods Explanation;Demonstration    Comprehension Verbalized understanding;Returned demonstration            PT Short Term Goals - 02/29/20 1145      PT SHORT TERM GOAL #1   Title Patient will show independence with HEP to improve cardiovascular ability and LE strength    Baseline 02/29/20- dependent    Time 3    Period Weeks    Status New    Target Date 03/21/20             PT Long Term Goals - 02/29/20 1149      PT LONG TERM GOAL #1   Title Patient will improve by 344ft to show improved cardivascular ability    Baseline 02/29/20- 673 ft    Time 6    Period Weeks    Status  New    Target Date 04/11/20      PT LONG TERM GOAL #2   Title Patient will improve performance in 30s sit to stand test to 15 reps show improved LE strength    Baseline 02/29/20- 9reps    Time 6    Period Weeks    Status New    Target Date 04/11/20      PT LONG TERM GOAL #3   Title Patient will be able to walk for 15 min without taking a break to show improved cardiavasuclar function    Baseline 02/29/20- 3 min    Time 6    Period Weeks    Status New    Target Date 04/11/20      PT LONG TERM GOAL #4   Title FOTO goal                 Plan - 03/30/20 1832    Clinical Impression Statement Patient demonstrates decreased cardiovascular endurance in SpO2 getting as low as 89% during today's session during 2.5/3 minute walking program. Patient is showing increased muscular strength and endurance while performing SLR with weights. Patient will benefit from skilled therapy to return to prior level of function.    Personal Factors and Comorbidities Comorbidity 2    Comorbidities Obesity, Diabetes    Examination-Activity Limitations  Squat;Stairs;Stand;Other;Transfers;Locomotion Level    Examination-Participation Restrictions Occupation;Other    Stability/Clinical Decision Making Evolving/Moderate complexity    Rehab Potential Good    PT Frequency 2x / week    PT Duration 8 weeks    PT Treatment/Interventions ADLs/Self Care Home Management;Cryotherapy;Electrical Stimulation;Moist Heat;Ultrasound;Gait training;Stair training;Functional mobility training;Therapeutic activities;Therapeutic exercise;Balance training;Neuromuscular re-education;Manual techniques;Passive range of motion;Energy conservation;Spinal Manipulations;Joint Manipulations    PT Next Visit Plan Check balance, Hip IR/ER strength, LE strengthening    Consulted and Agree with Plan of Care Patient           Patient will benefit from skilled therapeutic intervention in order to improve the following deficits and impairments:  Abnormal gait,Cardiopulmonary status limiting activity,Decreased activity tolerance,Decreased balance,Decreased coordination,Decreased endurance,Decreased mobility,Decreased range of motion,Decreased strength,Difficulty walking,Hypomobility,Pain  Visit Diagnosis: Muscle weakness (generalized)  Difficulty in walking, not elsewhere classified     Problem List Patient Active Problem List   Diagnosis Date Noted  . Hip pain 03/29/2020  . Pulmonary embolism (HCC) 12/28/2019  . Postural dizziness with presyncope 12/03/2019  . History of COVID-19 11/03/2019  . Shortness of breath 11/03/2019  . Acute respiratory failure with hypoxia (HCC) 11/03/2019  . Generalized anxiety disorder 02/23/2019  . Sunburn 08/21/2018  . Chronic pain of right knee 03/27/2017  . Transient adjustment reaction with anxiety 05/02/2015  . Edema 01/19/2013  . OSA (obstructive sleep apnea) 09/15/2012  . Obesity, morbid, BMI 50 or higher (HCC) 11/12/2011  . 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 POLYCYSTIC OVARIES 07/08/2009  .  DEPRESSION, MAJOR, RECURRENT, MODERATE 07/08/2009    Marden Noble, SPT 03/30/2020, 7:02 PM  [place] [facility] PHYSICAL AND SPORTS MEDICINE [address], [place], [address] Phone: [phone]   Fax:  [phone]  Name: Kristy Conrad MRN: [id number] Date of Birth: July 14, 1975

## 2020-03-30 NOTE — Patient Instructions (Signed)
Medication Instructions:  The current medical regimen is effective;  continue present plan and medications. (please let us know if you can not get the Eliquis, we can help with patient assistance options)  *If you need a refill on your cardiac medications before your next appointment, please call your pharmacy*  Follow-Up: At [facility], you and your health needs are our priority.  As part of our continuing mission to provide you with exceptional heart care, we have created designated Provider Care Teams.  These Care Teams include your primary Cardiologist (physician) and Advanced Practice Providers (APPs -  Physician Assistants and Nurse Practitioners) who all work together to provide you with the care you need, when you need it.  We recommend signing up for the patient portal called "MyChart".  Sign up information is provided on this After Visit Summary.  MyChart is used to connect with patients for Virtual Visits (Telemedicine).  Patients are able to view lab/test results, encounter notes, upcoming appointments, etc.  Non-urgent messages can be sent to your provider as well.   To learn more about what you can do with MyChart, go to [email or web address].    Your next appointment:   3 month(s)  The format for your next appointment:   In Person  Provider:   Lennie Odor, MD

## 2020-04-04 ENCOUNTER — Other Ambulatory Visit: Payer: Self-pay

## 2020-04-04 ENCOUNTER — Ambulatory Visit: Payer: BC Managed Care – PPO | Attending: Nurse Practitioner

## 2020-04-04 ENCOUNTER — Ambulatory Visit (INDEPENDENT_AMBULATORY_CARE_PROVIDER_SITE_OTHER): Payer: BC Managed Care – PPO | Admitting: Pulmonary Disease

## 2020-04-04 DIAGNOSIS — U099 Post covid-19 condition, unspecified: Secondary | ICD-10-CM | POA: Diagnosis present

## 2020-04-04 DIAGNOSIS — R41841 Cognitive communication deficit: Secondary | ICD-10-CM | POA: Insufficient documentation

## 2020-04-04 DIAGNOSIS — R0602 Shortness of breath: Secondary | ICD-10-CM | POA: Diagnosis not present

## 2020-04-04 DIAGNOSIS — R262 Difficulty in walking, not elsewhere classified: Secondary | ICD-10-CM | POA: Diagnosis present

## 2020-04-04 DIAGNOSIS — M6281 Muscle weakness (generalized): Secondary | ICD-10-CM | POA: Insufficient documentation

## 2020-04-04 DIAGNOSIS — R41840 Attention and concentration deficit: Secondary | ICD-10-CM | POA: Insufficient documentation

## 2020-04-04 NOTE — Therapy (Signed)
[facility] PH[facility]ORTS MEDICINE [address], [place], [address] Phone: [phone]   Fax:  [phone]  Physical Therapy Treatment  Patient Details  Name: Kristy Conrad MRN: [id number] Date of Birth: 09/14/1975 No data recorded  Encounter Date: 04/04/2020   PT End of Session - 04/04/20 1824    Visit Number 7    Number of Visits 17    Date for PT Re-Evaluation 04/25/20    PT Start Time 1730    PT Stop Time 1815    PT Time Calculation (min) 45 min    Activity Tolerance Patient tolerated treatment well    Behavior During Therapy [facility] for tasks assessed/performed           Past Medical History:  Diagnosis Date  . Depression   . Diabetes mellitus   . Hyperlipidemia   . Hypertension   . Obesity   . PCOS (polycystic ovarian syndrome)   . Pulmonary embolism [facility])     Past Surgical History:  Procedure Laterality Date  . BREAST SURGERY     breast reduction  . CHOLECYSTECTOMY    . WISDOM TOOTH EXTRACTION      There were no vitals filed for this visit.   Subjective Assessment - 04/04/20 1822    Subjective Patient reports she feels good today. Patient states that the pain is still constant but doesn't bother her as much.    Limitations Standing;Walking;House hold activities    How long can you sit comfortably? unlimited    How long can you stand comfortably? 10 min    How long can you walk comfortably? 3 min    Diagnostic tests CT scan, XRAY    Currently in Pain? No/denies    Pain Onset More than a month ago           TREATMENT Therapeutic Exercise Walking program - 30 sec, 30 sec, 3 min, HR: 152 SpO2: 90% (at the lowest) Bridges in hooklying - 3 x 10  Hip abduction in sidelying  -- 4 x10  Hip clamshells - 3 x10  Heel raises in standing - 3 x10  Hip extension in standing RTB 3 x 10 Rotary hip adduction machine 25# 3 x 10  B SLR in supine w/ 3# ankle weights 3 x 10  Monster walks w/ RTB 50ft x 4       Squats w/ UE support 3 x 10       Performed exercises to address LE weakness        PT Education - 04/04/20 1824    Education Details form/technique with exercise    Person(s) Educated Patient    Methods Explanation;Demonstration    Comprehension Verbalized understanding;Returned demonstration            PT Short Term Goals - 02/29/20 1145      PT SHORT TERM GOAL #1   Title Patient will show independence with HEP to improve cardiovascular ability and LE strength    Baseline 02/29/20- dependent    Time 3    Period Weeks    Status New    Target Date 03/21/20             PT Long Term Goals - 02/29/20 1149      PT LONG TERM GOAL #1   Title Patient will improve by 377ft to show improved cardivascular ability    Baseline 02/29/20- 673 ft    Time 6    Period  Weeks    Status New    Target Date 04/11/20      PT LONG TERM GOAL #2   Title Patient will improve performance in 30s sit to stand test to 15 reps show improved LE strength    Baseline 02/29/20- 9reps    Time 6    Period Weeks    Status New    Target Date 04/11/20      PT LONG TERM GOAL #3   Title Patient will be able to walk for 15 min without taking a break to show improved cardiavasuclar function    Baseline 02/29/20- 3 min    Time 6    Period Weeks    Status New    Target Date 04/11/20      PT LONG TERM GOAL #4   Title FOTO goal                 Plan - 04/04/20 1825    Clinical Impression Statement Patient demonstrates an improvement with cardiovascular endurance and increased pace with ambulation during today's session. Patient also took minimal breaks during 2.5/3 minute walking program. However, patient still has some increased knee pain during SLR with weights secondary to quad weakness. Patient will further benefit from skilled therapy to return to prior level of function.    Personal Factors and Comorbidities Comorbidity 2    Comorbidities Obesity, Diabetes    Examination-Activity  Limitations Squat;Stairs;Stand;Other;Transfers;Locomotion Level    Examination-Participation Restrictions Occupation;Other    Stability/Clinical Decision Making Evolving/Moderate complexity    Rehab Potential Good    PT Frequency 2x / week    PT Duration 8 weeks    PT Treatment/Interventions ADLs/Self Care Home Management;Cryotherapy;Electrical Stimulation;Moist Heat;Ultrasound;Gait training;Stair training;Functional mobility training;Therapeutic activities;Therapeutic exercise;Balance training;Neuromuscular re-education;Manual techniques;Passive range of motion;Energy conservation;Spinal Manipulations;Joint Manipulations    PT Next Visit Plan Check balance, Hip IR/ER strength, LE strengthening    Consulted and Agree with Plan of Care Patient           Patient will benefit from skilled therapeutic intervention in order to improve the following deficits and impairments:  Abnormal gait,Cardiopulmonary status limiting activity,Decreased activity tolerance,Decreased balance,Decreased coordination,Decreased endurance,Decreased mobility,Decreased range of motion,Decreased strength,Difficulty walking,Hypomobility,Pain  Visit Diagnosis: Muscle weakness (generalized)  Difficulty in walking, not elsewhere classified     Problem List Patient Active Problem List   Diagnosis Date Noted  . Hip pain 03/29/2020  . Pulmonary embolism (HCC) 12/28/2019  . Postural dizziness with presyncope 12/03/2019  . History of COVID-19 11/03/2019  . Shortness of breath 11/03/2019  . Acute respiratory failure with hypoxia (HCC) 11/03/2019  . Generalized anxiety disorder 02/23/2019  . Sunburn 08/21/2018  . Chronic pain of right knee 03/27/2017  . Transient adjustment reaction with anxiety 05/02/2015  . Edema 01/19/2013  . OSA (obstructive sleep apnea) 09/15/2012  . Obesity, morbid, BMI 50 or higher (HCC) 11/12/2011  . 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 POLYCYSTIC OVARIES 07/08/2009   . DEPRESSION, MAJOR, RECURRENT, MODERATE 07/08/2009    Marden Noble, SPT 04/04/2020, 6:32 PM  [place] [facility] PHYSICAL AND SPORTS MEDICINE [address], [place], [address] Phone: [phone]   Fax:  [phone]  Name: Kristy Conrad MRN: [id number] Date of Birth: 1975/06/01

## 2020-04-05 ENCOUNTER — Ambulatory Visit (INDEPENDENT_AMBULATORY_CARE_PROVIDER_SITE_OTHER): Payer: BC Managed Care – PPO | Admitting: Nurse Practitioner

## 2020-04-05 VITALS — BP 136/73 | HR 78 | Temp 97.9°F | Resp 16 | Ht 63.75 in | Wt 346.0 lb

## 2020-04-05 DIAGNOSIS — U099 Post covid-19 condition, unspecified: Secondary | ICD-10-CM

## 2020-04-05 DIAGNOSIS — R41840 Attention and concentration deficit: Secondary | ICD-10-CM | POA: Diagnosis not present

## 2020-04-05 DIAGNOSIS — Z8616 Personal history of COVID-19: Secondary | ICD-10-CM | POA: Diagnosis not present

## 2020-04-05 NOTE — Progress Notes (Signed)
@Patient  ID: , female    DOB: [birth year], 45 y.o.   MRN: [id number]  Chief Complaint  Patient presents with  . Follow-up    DX 10/2019. Breathing is better but still has some SOB. Walking long term causes breathing issues. Does experience body aches lower extrememties constant (pain range 3-6). States she may have covid brain. Having hard time with memory and word search.  Started PT in Feb 2022.     Referring provider: Mar 2022, MD   45 year old female with history of sleep apnea, diabetes, depression, and obesity. Diagnosed with Covid 10/13/19.   HPI  Patient presents today for post COVID care clinic visit follow-up.  Patient was last seen in this clinic on December 01, 2019.  Patient has returned to work.  She states that she has been having a hard time since return to work with fatigue and energy.  She also is having chronic hip and knee pain.  She is working with physical therapy and states that this does help.  She is going to follow-up with sports medicine as well for hip and knee pain.  She is asking to be written out of work for a few weeks to focus on physical therapy and also mental health.  Patient also reports issues with brain fog and memory loss.  We discussed that we can place a referral to speech therapy for cognitive rehabilitation.  Denies f/c/s, n/v/d, hemoptysis, PND, chest pain or edema.      Allergies  Allergen Reactions  . Morphine     REACTION: vomiting    Immunization History  Administered Date(s) Administered  . Influenza Split 10/27/2010, 11/12/2011, 11/06/2014  . Influenza Whole 10/12/2009  . Influenza,inj,Quad PF,6+ Mos 10/22/2012, 09/25/2013, 11/08/2018, 12/28/2019  . Influenza-Unspecified 11/18/2015, 10/23/2017  . PFIZER(Purple Top)SARS-COV-2 Vaccination 05/08/2019, 06/09/2019, 04/01/2020  . PPD Test 10/27/2010  . Pneumococcal Polysaccharide-23 10/20/2009, 11/01/2015  . Td 07/07/2008    Past Medical History:  Diagnosis  Date  . Depression   . Diabetes mellitus   . Hyperlipidemia   . Hypertension   . Obesity   . PCOS (polycystic ovarian syndrome)   . Pulmonary embolism (HCC)     Tobacco History: Social History   Tobacco Use  Smoking Status Never Smoker  Smokeless Tobacco Never Used   Counseling given: Yes   Outpatient Encounter Medications as of 04/05/2020  Medication Sig  . albuterol (VENTOLIN HFA) 108 (90 Base) MCG/ACT inhaler TAKE 2 PUFFS BY MOUTH EVERY 6 HOURS AS NEEDED FOR WHEEZE OR SHORTNESS OF BREATH  . apixaban (ELIQUIS) 5 MG TABS tablet Take 1 tablet (5 mg total) by mouth 2 (two) times daily.  . cetirizine (ZYRTEC) 10 MG tablet Take 10 mg by mouth daily.  . Cholecalciferol (VITAMIN D3) 50 MCG (2000 UT) TABS Take 50 mcg by mouth daily.  . COLLAGEN PO Take by mouth.  . diclofenac (VOLTAREN) 75 MG EC tablet Take 1 tablet (75 mg total) by mouth 2 (two) times daily.  06/05/2020 FLUoxetine (PROZAC) 20 MG capsule TAKE 1 CAPSULE BY MOUTH EVERY DAY  . fluticasone furoate-vilanterol (BREO ELLIPTA) 200-25 MCG/INH AEPB Inhale 1 puff into the lungs daily.  . folic acid (FOLVITE) 1 MG tablet Take 1 mg by mouth daily.  Marland Kitchen glipiZIDE (GLUCOTROL) 5 MG tablet TAKE 1 TABLET (5 MG TOTAL) BY MOUTH 2 (TWO) TIMES DAILY BEFORE A MEAL.  Marland Kitchen Glucosamine HCl-MSM (GLUCOSAMINE-MSM PO) Take 1,500 mg by mouth daily.  Marland Kitchen lisinopril (ZESTRIL) 5 MG tablet TAKE ONE TABLET  BY MOUTH DAILY  . metFORMIN (GLUCOPHAGE) 1000 MG tablet TAKE 1 TABLET BY MOUTH TWICE A DAY  . Turmeric Curcumin 500 MG CAPS Take 3 capsules by mouth daily.   . [DISCONTINUED] apixaban (ELIQUIS) 5 MG TABS tablet TAKE 1 TABLET (5 MG TOTAL) BY MOUTH 2 (TWO) TIMES DAILY.   No facility-administered encounter medications on file as of 04/05/2020.     Review of Systems  Review of Systems  Constitutional: Positive for fatigue. Negative for fever.  HENT: Negative.   Respiratory: Positive for shortness of breath. Negative for cough.   Cardiovascular: Negative.    Gastrointestinal: Negative.   Musculoskeletal: Positive for arthralgias and myalgias.  Allergic/Immunologic: Negative.   Neurological: Negative.   Psychiatric/Behavioral: Negative.        Physical Exam  BP 136/73   Pulse 78   Temp 97.9 F (36.6 C) (Temporal)   Resp 16   Ht 5' 3.75" (1.619 m)   Wt (!) 346 lb (156.9 kg)   SpO2 97%   BMI 59.86 kg/m   Wt Readings from Last 5 Encounters:  04/05/20 (!) 346 lb (156.9 kg)  03/30/20 (!) 346 lb (156.9 kg)  03/29/20 (!) 346 lb 8 oz (157.2 kg)  02/18/20 (!) 358 lb (162.4 kg)  01/12/20 (!) 342 lb 3.2 oz (155.2 kg)     Physical Exam Vitals and nursing note reviewed.  Constitutional:      General: She is not in acute distress.    Appearance: She is well-developed.  Cardiovascular:     Rate and Rhythm: Normal rate and regular rhythm.  Pulmonary:     Effort: Pulmonary effort is normal.     Breath sounds: Normal breath sounds.  Musculoskeletal:     Right lower leg: No edema.     Left lower leg: No edema.  Neurological:     Mental Status: She is alert and oriented to person, place, and time.  Psychiatric:        Mood and Affect: Mood normal.        Behavior: Behavior normal.        Assessment & Plan:   History of COVID-19 Shortness of breath:   Stay well hydrated  Stay active  Deep breathing exercises  May start vitamin C daily, vitamin D3 daily, Zinc daily  May take tylenol or fever or pain  May take mucinex twice daily  Continue to follow with cardiology  Continue to follow with Pulmonary   Obesity:  Please reschedule with dietitian   Encouraged seeing nutrition again which she will do and working to make healthy choices. Cont PT and increasing exercising as tolerated  Chronic knee and hip pain:  improvement with PT - may consider referral to PMR  Please schedule with sports medicine to consider steroid injection vs orthovisc.    Memory loss:  Will refer to speech therapy for  cognitive rehabilitation      Follow up:  Follow up in 2 months       Ivonne Andrew, NP 04/05/2020

## 2020-04-05 NOTE — Patient Instructions (Addendum)
Covid 19 Shortness of breath:   Stay well hydrated  Stay active  Deep breathing exercises  May start vitamin C daily, vitamin D3 daily, Zinc daily  May take tylenol or fever or pain  May take mucinex twice daily  Continue to follow with cardiology  Continue to follow with Pulmonary   Obesity:  Please reschedule with dietitian   Encouraged seeing nutrition again which she will do and working to make healthy choices. Cont PT and increasing exercising as tolerated  Chronic knee and hip pain:  improvement with PT - may consider referral to PMR  Please schedule with sports medicine to consider steroid injection vs orthovisc.    Memory loss:  Will refer to speech therapy for cognitive rehabilitation      Follow up:  Follow up in 2 months

## 2020-04-05 NOTE — Assessment & Plan Note (Signed)
Shortness of breath:   Stay well hydrated  Stay active  Deep breathing exercises  May start vitamin C daily, vitamin D3 daily, Zinc daily  May take tylenol or fever or pain  May take mucinex twice daily  Continue to follow with cardiology  Continue to follow with Pulmonary   Obesity:  Please reschedule with dietitian   Encouraged seeing nutrition again which she will do and working to make healthy choices. Cont PT and increasing exercising as tolerated  Chronic knee and hip pain:  improvement with PT - may consider referral to PMR  Please schedule with sports medicine to consider steroid injection vs orthovisc.    Memory loss:  Will refer to speech therapy for cognitive rehabilitation      Follow up:  Follow up in 2 months

## 2020-04-06 ENCOUNTER — Ambulatory Visit: Payer: BC Managed Care – PPO

## 2020-04-06 ENCOUNTER — Other Ambulatory Visit: Payer: Self-pay

## 2020-04-06 DIAGNOSIS — M6281 Muscle weakness (generalized): Secondary | ICD-10-CM | POA: Diagnosis not present

## 2020-04-06 DIAGNOSIS — R262 Difficulty in walking, not elsewhere classified: Secondary | ICD-10-CM

## 2020-04-06 NOTE — Therapy (Signed)
[place] [facility]TS [facility]IC[facility] [address], [place], [address] Phone: [phone]   Fax:  [phone]  Physical Therapy Treatment  Patient Details  Name: Kristy Conrad MRN: [id number] Date of Birth: Sep 29, 1975 No data recorded  Encounter Date: 04/06/2020   PT End of Session - 04/06/20 1610    Visit Number 8    Number of Visits 17    Date for PT Re-Evaluation 04/25/20    PT Start Time 1515    PT Stop Time 1600    PT Time Calculation (min) 45 min    Activity Tolerance Patient tolerated treatment well    Behavior During Therapy [facility] for tasks assessed/performed           Past Medical History:  Diagnosis Date  . Depression   . Diabetes mellitus   . Hyperlipidemia   . Hypertension   . Obesity   . PCOS (polycystic ovarian syndrome)   . Pulmonary embolism [facility])     Past Surgical History:  Procedure Laterality Date  . BREAST SURGERY     breast reduction  . CHOLECYSTECTOMY    . WISDOM TOOTH EXTRACTION      There were no vitals filed for this visit.   Subjective Assessment - 04/06/20 1607    Subjective Patient states she has an increase in R LE pain today because of sleeping in an uncomfortable position.    Limitations Standing;Walking;House hold activities    How long can you sit comfortably? unlimited    How long can you stand comfortably? 10 min    How long can you walk comfortably? 3 min    Diagnostic tests CT scan, XRAY    Currently in Pain? Yes    Pain Score 5     Pain Onset More than a month ago           TREATMENT Therapeutic Exercise Walking program - 30 sec, 30 sec, 3 min, HR: 150 SpO2: 89% (at the lowest) Bridges in hooklying - 3 x 10  Hip abduction in sidelying  -- 4 x10  Hip clamshells - 3 x10  Heel raises in standing - 3 x10  Hip extension in standing RTB 3 x 10 Rotary hip adduction machine 25# 3 x 10  B SLR in supine w/ 3 x 10    Squats w/ UE support 3 x 10       Performed  exercises to address LE weakness        PT Education - 04/06/20 1610    Education Details form/technique with exercise    Person(s) Educated Patient    Methods Explanation;Demonstration    Comprehension Verbalized understanding;Returned demonstration            PT Short Term Goals - 02/29/20 1145      PT SHORT TERM GOAL #1   Title Patient will show independence with HEP to improve cardiovascular ability and LE strength    Baseline 02/29/20- dependent    Time 3    Period Weeks    Status New    Target Date 03/21/20             PT Long Term Goals - 02/29/20 1149      PT LONG TERM GOAL #1   Title Patient will improve by 342ft to show improved cardivascular ability    Baseline 02/29/20- 673 ft    Time 6    Period Weeks    Status New  Target Date 04/11/20      PT LONG TERM GOAL #2   Title Patient will improve performance in 30s sit to stand test to 15 reps show improved LE strength    Baseline 02/29/20- 9reps    Time 6    Period Weeks    Status New    Target Date 04/11/20      PT LONG TERM GOAL #3   Title Patient will be able to walk for 15 min without taking a break to show improved cardiavasuclar function    Baseline 02/29/20- 3 min    Time 6    Period Weeks    Status New    Target Date 04/11/20      PT LONG TERM GOAL #4   Title FOTO goal                 Plan - 04/06/20 1611    Clinical Impression Statement Patient continues to demonstrate improvement with ambulation and cardiovascular endurance during walking program. Patient still has some increased pain while performing hip add/abduction and extension exercises with resistance. Patient will continue to benefit from skilled physical therapy to return to prior level of function.    Personal Factors and Comorbidities Comorbidity 2    Comorbidities Obesity, Diabetes    Examination-Activity Limitations Squat;Stairs;Stand;Other;Transfers;Locomotion Level    Examination-Participation Restrictions  Occupation;Other    Stability/Clinical Decision Making Evolving/Moderate complexity    Rehab Potential Good    PT Frequency 2x / week    PT Duration 8 weeks    PT Treatment/Interventions ADLs/Self Care Home Management;Cryotherapy;Electrical Stimulation;Moist Heat;Ultrasound;Gait training;Stair training;Functional mobility training;Therapeutic activities;Therapeutic exercise;Balance training;Neuromuscular re-education;Manual techniques;Passive range of motion;Energy conservation;Spinal Manipulations;Joint Manipulations    PT Next Visit Plan Check balance, Hip IR/ER strength, LE strengthening    Consulted and Agree with Plan of Care Patient           Patient will benefit from skilled therapeutic intervention in order to improve the following deficits and impairments:  Abnormal gait,Cardiopulmonary status limiting activity,Decreased activity tolerance,Decreased balance,Decreased coordination,Decreased endurance,Decreased mobility,Decreased range of motion,Decreased strength,Difficulty walking,Hypomobility,Pain  Visit Diagnosis: Muscle weakness (generalized)  Difficulty in walking, not elsewhere classified     Problem List Patient Active Problem List   Diagnosis Date Noted  . COVID-19 long hauler manifesting chronic concentration deficit 04/05/2020  . Hip pain 03/29/2020  . Pulmonary embolism (HCC) 12/28/2019  . Postural dizziness with presyncope 12/03/2019  . History of COVID-19 11/03/2019  . Shortness of breath 11/03/2019  . Acute respiratory failure with hypoxia (HCC) 11/03/2019  . Generalized anxiety disorder 02/23/2019  . Sunburn 08/21/2018  . Chronic pain of right knee 03/27/2017  . Transient adjustment reaction with anxiety 05/02/2015  . Edema 01/19/2013  . OSA (obstructive sleep apnea) 09/15/2012  . Obesity, morbid, BMI 50 or higher (HCC) 11/12/2011  . 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 POLYCYSTIC OVARIES 07/08/2009  . DEPRESSION, MAJOR,  RECURRENT, MODERATE 07/08/2009    Marden Noble, SPT 04/06/2020, 4:20 PM  [place] [facility] PHYSICAL AND SPORTS MEDICINE [address] [place], [address] Phone: [phone]   Fax:  [phone]  Name: Kristy Conrad MRN: [id number] Date of Birth: [birth year]

## 2020-04-11 ENCOUNTER — Ambulatory Visit: Payer: BC Managed Care – PPO

## 2020-04-13 ENCOUNTER — Ambulatory Visit: Payer: BC Managed Care – PPO

## 2020-04-18 ENCOUNTER — Ambulatory Visit: Payer: BC Managed Care – PPO

## 2020-04-18 ENCOUNTER — Other Ambulatory Visit: Payer: Self-pay

## 2020-04-18 DIAGNOSIS — M6281 Muscle weakness (generalized): Secondary | ICD-10-CM

## 2020-04-18 DIAGNOSIS — R262 Difficulty in walking, not elsewhere classified: Secondary | ICD-10-CM

## 2020-04-18 NOTE — Therapy (Signed)
[place] [facility] PH[facility] MEDICINE [address], [place], [address] Phone: [phone]   Fax:  [phone]  Physical Therapy Treatment  Patient Details  Name: Kristy Conrad MRN: [id number] Date of Birth: 1975/05/23 No data recorded  Encounter Date: 04/18/2020   PT End of Session - 04/18/20 1821    Visit Number 9    Number of Visits 17    Date for PT Re-Evaluation 04/25/20    PT Start Time 1735    PT Stop Time 1814    PT Time Calculation (min) 39 min    Activity Tolerance Patient tolerated treatment well    Behavior During Therapy [facility] for tasks assessed/performed           Past Medical History:  Diagnosis Date  . Depression   . Diabetes mellitus   . Hyperlipidemia   . Hypertension   . Obesity   . PCOS (polycystic ovarian syndrome)   . Pulmonary embolism [facility])     Past Surgical History:  Procedure Laterality Date  . BREAST SURGERY     breast reduction  . CHOLECYSTECTOMY    . WISDOM TOOTH EXTRACTION      There were no vitals filed for this visit.   Subjective Assessment - 04/18/20 1817    Subjective Patient reports that she has decreased R heel pain since her last therapy session.    Limitations Standing;Walking;House hold activities    How long can you sit comfortably? unlimited    How long can you stand comfortably? 10 min    How long can you walk comfortably? 3 min    Diagnostic tests CT scan, XRAY    Currently in Pain? No/denies    Pain Onset More than a month ago           TREATMENT Therapeutic Exercise Walking program - 30 sec, 30 sec, 3 min, HR: 15 9SpO2: 91% (at the lowest) Bridges in hooklying - 3 x 10 B Hip abduction in sidelying  -- 4 x10 B Hip clamshells - 3 x10 Heel raises in standing - 3 x10 B Hip extension in standing with RTB around ankles w/ UE support 3 x 10 B Rotary hip abduction machine 25# 3 x 10 B SLR in supine w/ 3 x 10 Squats w/ UE support 3 x 10  Performed  exercises to address LE weakness    PT Education - 04/18/20 1821    Education Details form/technique with exercise    Person(s) Educated Patient    Methods Explanation;Demonstration    Comprehension Verbalized understanding;Returned demonstration            PT Short Term Goals - 02/29/20 1145      PT SHORT TERM GOAL #1   Title Patient will show independence with HEP to improve cardiovascular ability and LE strength    Baseline 02/29/20- dependent    Time 3    Period Weeks    Status New    Target Date 03/21/20             PT Long Term Goals - 02/29/20 1149      PT LONG TERM GOAL #1   Title Patient will improve by 332ft to show improved cardivascular ability    Baseline 02/29/20- 673 ft    Time 6    Period Weeks    Status New    Target Date 04/11/20      PT LONG TERM GOAL #2   Title Patient  will improve performance in 30s sit to stand test to 15 reps show improved LE strength    Baseline 02/29/20- 9reps    Time 6    Period Weeks    Status New    Target Date 04/11/20      PT LONG TERM GOAL #3   Title Patient will be able to walk for 15 min without taking a break to show improved cardiavasuclar function    Baseline 02/29/20- 3 min    Time 6    Period Weeks    Status New    Target Date 04/11/20      PT LONG TERM GOAL #4   Title FOTO goal                 Plan - 04/18/20 1822    Clinical Impression Statement Patient continues to demonstrate improvement with ambulation and cardiovascular endurance with no rest breaks required during today's walkig program. Patient still has some B hip abduction/extension weakness while performing prolonged repetitions during exercise. Patient will further benefit from skilled therapy to return to prior level of function.    Personal Factors and Comorbidities Comorbidity 2    Comorbidities Obesity, Diabetes    Examination-Activity Limitations Squat;Stairs;Stand;Other;Transfers;Locomotion Level     Examination-Participation Restrictions Occupation;Other    Stability/Clinical Decision Making Evolving/Moderate complexity    Rehab Potential Good    PT Frequency 2x / week    PT Duration 8 weeks    PT Treatment/Interventions ADLs/Self Care Home Management;Cryotherapy;Electrical Stimulation;Moist Heat;Ultrasound;Gait training;Stair training;Functional mobility training;Therapeutic activities;Therapeutic exercise;Balance training;Neuromuscular re-education;Manual techniques;Passive range of motion;Energy conservation;Spinal Manipulations;Joint Manipulations    PT Next Visit Plan Check balance, Hip IR/ER strength, LE strengthening    Consulted and Agree with Plan of Care Patient           Patient will benefit from skilled therapeutic intervention in order to improve the following deficits and impairments:  Abnormal gait,Cardiopulmonary status limiting activity,Decreased activity tolerance,Decreased balance,Decreased coordination,Decreased endurance,Decreased mobility,Decreased range of motion,Decreased strength,Difficulty walking,Hypomobility,Pain  Visit Diagnosis: Muscle weakness (generalized)  Difficulty in walking, not elsewhere classified     Problem List Patient Active Problem List   Diagnosis Date Noted  . COVID-19 long hauler manifesting chronic concentration deficit 04/05/2020  . Hip pain 03/29/2020  . Pulmonary embolism (HCC) 12/28/2019  . Postural dizziness with presyncope 12/03/2019  . History of COVID-19 11/03/2019  . Shortness of breath 11/03/2019  . Acute respiratory failure with hypoxia (HCC) 11/03/2019  . Generalized anxiety disorder 02/23/2019  . Sunburn 08/21/2018  . Chronic pain of right knee 03/27/2017  . Transient adjustment reaction with anxiety 05/02/2015  . Edema 01/19/2013  . OSA (obstructive sleep apnea) 09/15/2012  . Obesity, morbid, BMI 50 or higher (HCC) 11/12/2011  . 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 POLYCYSTIC OVARIES  07/08/2009  . DEPRESSION, MAJOR, RECURRENT, MODERATE 07/08/2009    Delphia Grates. Fairly IV, PT, DPT Physical Therapist- [place]  [facility]  04/18/2020, 8:43 PM  [place] [facility] PHYSICAL AND SPORTS MEDICINE [address], [place], [address] Phone: [phone]   Fax:  [phone]  Name: SNEHA WILLIG MRN: [id number] Date of Birth: 1975-04-08

## 2020-04-20 ENCOUNTER — Ambulatory Visit: Payer: BC Managed Care – PPO

## 2020-04-21 ENCOUNTER — Ambulatory Visit: Payer: BC Managed Care – PPO | Admitting: Speech Pathology

## 2020-04-25 ENCOUNTER — Other Ambulatory Visit: Payer: Self-pay

## 2020-04-25 ENCOUNTER — Ambulatory Visit: Payer: BC Managed Care – PPO

## 2020-04-25 DIAGNOSIS — R262 Difficulty in walking, not elsewhere classified: Secondary | ICD-10-CM

## 2020-04-25 DIAGNOSIS — M6281 Muscle weakness (generalized): Secondary | ICD-10-CM

## 2020-04-25 NOTE — Therapy (Signed)
[facility] [facility] [address], [place], [address] Phone: [phone]   Fax:  [phone]  Physical Therapy Treatment/Physical Therapy Progress Note   Dates of reporting period  02/29/20 to   04/25/20   Patient Details  Name: Kristy Conrad MRN: [id number] Date of Birth: Jan 10, 1975 No data recorded  Encounter Date: 04/25/2020   PT End of Session - 04/25/20 1821    Visit Number 10    Number of Visits 17    Date for PT Re-Evaluation 04/25/20    PT Start Time 1735    PT Stop Time 1815    PT Time Calculation (min) 40 min    Activity Tolerance Patient tolerated treatment well    Behavior During Therapy [facility] for tasks assessed/performed           Past Medical History:  Diagnosis Date  . Depression   . Diabetes mellitus   . Hyperlipidemia   . Hypertension   . Obesity   . PCOS (polycystic ovarian syndrome)   . Pulmonary embolism [facility])     Past Surgical History:  Procedure Laterality Date  . BREAST SURGERY     breast reduction  . CHOLECYSTECTOMY    . WISDOM TOOTH EXTRACTION      There were no vitals filed for this visit.   Subjective Assessment - 04/25/20 1740    Subjective Patient reports no significant changes in knee pain, still constant and is going to call MD about cortisone injection. Patient reports that she is trying to be more cognizant about foot placement while walking and working out by using more of a heel strike mechanism and states a decrease in heel pain while doing so. Patient also states that she feels though her cardiovascular endurance has decreased since the last session.    Limitations Standing;Walking;House hold activities    How long can you sit comfortably? unlimited    How long can you stand comfortably? 10 min    How long can you walk comfortably? 3 min    Diagnostic tests CT scan, XRAY    Currently in Pain? No/denies    Pain Onset More than a month ago          Refer to  clinical impression for update on patient progress.   TREATMENT Therapeutic Exercise Walking program - 30 sec, 30 sec, 3 min, HR: 115 BPM. SpO2: 91% (at the lowest). Standing rest between walking bouts. Beginning of PT, pt required frequent standing rest breaks during walking bouts now pt only needs standing rest b/t bouts. Bridges in hooklying - 3 x 10 B Hip abduction in sidelying  -- 3 x10 B Hip clamshells - 3 x10 B Ankle ABC's x 1 (each) in long sitting  B SLR in supine w/ 3 x 10     Performed exercises to address LE weakness          Patient's condition has the potential to improve in response to therapy. Maximum improvement is yet to be obtained. The anticipated improvement is attainable and reasonable in a generally predictable time.    PT Education - 04/25/20 1749    Education Details form/technique with exercise    Person(s) Educated Patient    Methods Explanation;Demonstration            PT Short Term Goals - 04/25/20 1755      PT SHORT TERM GOAL #1   Title Patient will show independence with HEP to improve cardiovascular  ability and LE strength    Baseline 02/29/20- dependent, 04/25/20: still working on it    Time 3    Period Weeks    Status On-going    Target Date 03/21/20             PT Long Term Goals - 04/25/20 1759      PT LONG TERM GOAL #1   Title Patient will improve by 338ft to show improved cardivascular ability    Baseline 02/29/20- 673 ft    Time 6    Period Weeks    Status New      PT LONG TERM GOAL #2   Title Patient will improve performance in 30s sit to stand test to 15 reps show improved LE strength    Baseline 02/29/20- 9reps, 04/25/20: 11 reps    Time 6    Period Weeks    Status On-going      PT LONG TERM GOAL #3   Title Patient will be able to walk for 15 min without taking a break to show improved cardiavasuclar function    Baseline 02/29/20- 3 min, 04/25/20: 3 min    Time 6    Period Weeks    Status On-going       PT LONG TERM GOAL #4   Title FOTO goal                 Plan - 04/25/20 1809    Clinical Impression Statement Overall, patient continues to show improvements with increased muscle strength and cardiovascular endurance. Patient reports that she feels like she is making progress in PT. Patient's current cardiovascular endurance is improving during her  2.5-3 min walking program. Pt  is now requiring no rest breaks with a post HR  of 115 BPM in comparison to a post HR of 150 BPM and frequent rest breaks in the first 2-3 weeks of physical therapy. Patient was able to increase 30 sec sit to stand transfers to 11 reps in comparison to 9 reps when done the first time. Improvements in these objective findings indicate improvement in cardiopulmonary endurance and LE strength. Although, patient is making progress, patient will further benefit from skilled therapy to further decrease pain limitations during ambulation, continue to improve cardiovascular endurance,  and improve hip muscle strengthenging.    Personal Factors and Comorbidities Comorbidity 2    Comorbidities Obesity, Diabetes    Examination-Activity Limitations Squat;Stairs;Stand;Other;Transfers;Locomotion Level    Examination-Participation Restrictions Occupation;Other    Stability/Clinical Decision Making Evolving/Moderate complexity    Rehab Potential Good    PT Frequency 2x / week    PT Duration 8 weeks    PT Treatment/Interventions ADLs/Self Care Home Management;Cryotherapy;Electrical Stimulation;Moist Heat;Ultrasound;Gait training;Stair training;Functional mobility training;Therapeutic activities;Therapeutic exercise;Balance training;Neuromuscular re-education;Manual techniques;Passive range of motion;Energy conservation;Spinal Manipulations;Joint Manipulations    PT Next Visit Plan Check balance, Hip IR/ER strength, LE strengthening    Consulted and Agree with Plan of Care Patient           Patient will benefit from  skilled therapeutic intervention in order to improve the following deficits and impairments:  Abnormal gait,Cardiopulmonary status limiting activity,Decreased activity tolerance,Decreased balance,Decreased coordination,Decreased endurance,Decreased mobility,Decreased range of motion,Decreased strength,Difficulty walking,Hypomobility,Pain  Visit Diagnosis: Muscle weakness (generalized)  Difficulty in walking, not elsewhere classified     Problem List Patient Active Problem List   Diagnosis Date Noted  . COVID-19 long hauler manifesting chronic concentration deficit 04/05/2020  . Hip pain 03/29/2020  . Pulmonary embolism (HCC) 12/28/2019  . Postural dizziness  with presyncope 12/03/2019  . History of COVID-19 11/03/2019  . Shortness of breath 11/03/2019  . Acute respiratory failure with hypoxia (HCC) 11/03/2019  . Generalized anxiety disorder 02/23/2019  . Sunburn 08/21/2018  . Chronic pain of right knee 03/27/2017  . Transient adjustment reaction with anxiety 05/02/2015  . Edema 01/19/2013  . OSA (obstructive sleep apnea) 09/15/2012  . Obesity, morbid, BMI 50 or higher (HCC) 11/12/2011  . 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 POLYCYSTIC OVARIES 07/08/2009  . DEPRESSION, MAJOR, RECURRENT, MODERATE 07/08/2009    Delphia Grates. Fairly IV, PT, DPT Physical Therapist- [place]  [facility]  04/26/2020, 7:41 AM  [place] [facility] PHYSICAL AND SPORTS MEDICINE [address] [place], [address] Phone: [phone]   Fax:  [phone]  Name: Kristy Conrad MRN: [id number] Date of Birth: 09-Mar-1975

## 2020-04-26 ENCOUNTER — Ambulatory Visit: Payer: BC Managed Care – PPO | Admitting: Speech Pathology

## 2020-04-26 DIAGNOSIS — R41841 Cognitive communication deficit: Secondary | ICD-10-CM

## 2020-04-26 DIAGNOSIS — M6281 Muscle weakness (generalized): Secondary | ICD-10-CM | POA: Diagnosis not present

## 2020-04-26 DIAGNOSIS — U099 Post covid-19 condition, unspecified: Secondary | ICD-10-CM

## 2020-04-26 DIAGNOSIS — R41840 Attention and concentration deficit: Secondary | ICD-10-CM

## 2020-04-27 ENCOUNTER — Ambulatory Visit: Payer: BC Managed Care – PPO

## 2020-04-27 ENCOUNTER — Other Ambulatory Visit: Payer: Self-pay

## 2020-04-27 DIAGNOSIS — M6281 Muscle weakness (generalized): Secondary | ICD-10-CM

## 2020-04-27 DIAGNOSIS — R262 Difficulty in walking, not elsewhere classified: Secondary | ICD-10-CM

## 2020-04-27 NOTE — Addendum Note (Signed)
Addended by: Arlana Lindau on: 04/27/2020 10:39 AM   Modules accepted: Orders

## 2020-04-27 NOTE — Therapy (Signed)
[facility] MAIN [facility] SERVICES [address] [place], [place], [address] Phone: [phone]   Fax:  [phone]  Speech Language Pathology Evaluation  Patient Details  Name: Kristy Conrad MRN: [id number] Date of Birth: [birth year] Referring Provider (SLP): Angus Seller, NP   Encounter Date: 04/26/2020   End of Session - 04/27/20 0831    Visit Number 1    Number of Visits 1    Date for SLP Re-Evaluation 04/26/20    Authorization Type [employer]    SLP Start Time 1400    SLP Stop Time  1500    SLP Time Calculation (min) 60 min    Activity Tolerance Patient tolerated treatment well           Past Medical History:  Diagnosis Date  . Depression   . Diabetes mellitus   . Hyperlipidemia   . Hypertension   . Obesity   . PCOS (polycystic ovarian syndrome)   . Pulmonary embolism [facility])     Past Surgical History:  Procedure Laterality Date  . BREAST SURGERY     breast reduction  . CHOLECYSTECTOMY    . WISDOM TOOTH EXTRACTION      There were no vitals filed for this visit.   Subjective Assessment - 04/26/20 1408    Subjective "I'm wordsearching, and I lose my train of thought, but it's been better the last few weeks."    Currently in Pain? Yes    Pain Score 3     Pain Location Ankle              SLP Evaluation [facility] - 04/26/20 1408      SLP Visit Information   SLP Received On 04/26/20    Referring Provider (SLP) Angus Seller, NP    Onset Date 10/13/19   referral date 04/05/20   Medical Diagnosis R41.840,U09.9 (ICD-10-CM) - COVID-19 long hauler manifesting chronic concentration deficit      General Information   HPI Kristy Conrad is a 45 y.o. female with past medical history of sleep apnea, diabetes, depression, and obesity. Diagnosed with Covid 10/13/19; referred by Angus Seller due to memory and wordfinding difficulties due to long-haul Covid-19.    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     Cognition   Overall Cognitive Status Within Functional Limits for tasks assessed    Attention Alternating    Sustained Attention Appears intact    Sustained Attention Impairment --    Alternating Attention Appears intact    Memory Appears intact    Awareness Appears intact    Problem Solving Appears intact    Executive Function Reasoning;Organizing;Decision Making;Self Monitoring;Self Correcting    Reasoning Appears intact    Organizing Appears intact    Decision Making Appears intact    Self Monitoring Appears intact    Self Correcting Appears intact      Auditory Comprehension   Overall Auditory Comprehension Appears within functional limits for tasks assessed      Visual Recognition/Discrimination   Discrimination Not tested      Reading Comprehension  Reading Status Within funtional limits      Expression   Primary Mode of Expression Verbal      Verbal Expression   Overall Verbal Expression Appears within functional limits for tasks assessed    Initiation No impairment    Level of Generative/Spontaneous Verbalization Conversation    Naming No impairment    Pragmatics No impairment      Written Expression   Dominant Hand Right    Written Expression Within Functional Limits      Oral Motor/Sensory Function   Overall Oral Motor/Sensory Function Appears within functional limits for tasks assessed      Motor Speech   Overall Motor Speech Appears within functional limits for tasks assessed      Standardized Assessments   Standardized Assessments  Cognitive Linguistic Quick Test                   AGE - 18 - 69    Cognitive Linguistic Quick Test   The Cognitive Linguistic Quick Test (CLQT) was administered to assess the relative status of five cognitive domains: attention, memory, language, executive functioning, and visuospatial skills. Scores from 10 tasks were used to estimate severity ratings (standardized for age groups 18-69 years and 70-89 years) for each domain, a clock drawing task, as well as an overall composite severity rating of cognition.       Task Score Criterion Cut Scores  Personal Facts 8/8 8  Symbol Cancellation 12/12 11  Confrontation Naming 10/10 10  Clock Drawing  13/13 12  Story Retelling 10/10 6  Symbol Trails 10/10 9  Generative Naming 9/9 5  Design Memory 6/6 5  Mazes  8/8 7  Design Generation 2/13 6    Cognitive Domain Composite Score Severity Rating  Attention 204/215 WNL  Memory 185/185 WNL  Executive Function 29/40 WNL  Language 37/37 WNL  Visuospatial Skills 94/105 WNL  Clock Drawing  13/13 WNL  Composite Severity Rating  WNL         SLP Education - 04/27/20 0839    Education Details strategies for wordfinding, thought organization, memory and attention. neuroplasticity. breaks for cognitive fatigue    Person(s) Educated Patient    Methods Explanation;Verbal cues;Handout    Comprehension Verbalized understanding                Plan - 04/27/20 0846    Clinical Impression Statement Pt presents with cognitive communication skills appearing within functional limits for tasks assessed. She scored in upper range of "WNL" for all cognitive domains assessed per Cognitive Linguistic Quick Test. Patient reported improvement in symptoms over the last several weeks with reduced demands, as she has been on leave from work. Prior to this, she was experiencing wordfinding difficulties, some re-occurrence of stuttering (which she had as a child), and difficulties multitasking. She reported she was anxious about lasting impacts on cognitive function from  Covid-19 and wanted to be evaluated as a precautionary measure. SLP educated on cognitive difficulties associated with long-haul Covid-19 and provided information packet with compensatory strategies for wordfinding,attention, executive function and recall. Educated pt on using compensatory aids and developing a plan, potential accommodations for return to work such as breaks to reduce cognitive fatigue and use of a planner to organize thoughts and scheduled activities and reduce mental load. At this time pt feels she is managing her daily activities well; no skilled ST is recommended at this time. Encouraged pt that she would be welcome to follow up if she experiences functional  deficits upon return to work.     Speech Therapy Frequency One time visit    Treatment/Interventions SLP instruction and feedback;Compensatory strategies;Patient/family education;Internal/external aids    Potential to Achieve Goals --   n/a, eval only   SLP Home Exercise Plan Discharge packet provided with cognitive strategies    Consulted and Agree with Plan of Care Patient           Patient will benefit from skilled therapeutic intervention in order to improve the following deficits and impairments:   Cognitive communication deficit  COVID-19 long hauler manifesting chronic concentration deficit    Problem List Patient Active Problem List   Diagnosis Date Noted  . COVID-19 long hauler manifesting chronic concentration deficit 04/05/2020  . Hip pain 03/29/2020  . Pulmonary embolism (HCC) 12/28/2019  . Postural dizziness with presyncope 12/03/2019  . History of COVID-19 11/03/2019  . Shortness of breath 11/03/2019  . Acute respiratory failure with hypoxia (HCC) 11/03/2019  . Generalized anxiety disorder 02/23/2019  . Sunburn 08/21/2018  . Chronic pain of right knee 03/27/2017  . Transient adjustment reaction with anxiety 05/02/2015  . Edema 01/19/2013  . OSA (obstructive sleep apnea) 09/15/2012  . Obesity,  morbid, BMI 50 or higher (HCC) 11/12/2011  . 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 POLYCYSTIC OVARIES 07/08/2009  . DEPRESSION, MAJOR, RECURRENT, MODERATE 07/08/2009   Rondel Baton, MS, CCC-SLP Speech-Language Pathologist  Arlana Lindau 04/27/2020, 8:46 AM  [place] [facility] MAIN [facility] SERVICES [address] [place], [place], [address] Phone: [phone]   Fax:  [phone]  Name: Kristy Conrad MRN: [id number] Date of Birth: 10/10/1975

## 2020-04-27 NOTE — Therapy (Signed)
[facility] PHYSICAL AND SPORTS MEDICINE [address], [place], [address] Phone: [phone]   Fax:  [phone]  Physical Therapy Treatment  Patient Details  Name: Kristy Conrad MRN: [id number] Date of Birth: 05/08/1975 No data recorded  Encounter Date: 04/27/2020   PT End of Session - 04/27/20 1617    Visit Number 11    Number of Visits 17    Date for PT Re-Evaluation 04/25/20    PT Start Time 1347    PT Stop Time 1430    PT Time Calculation (min) 43 min    Activity Tolerance Patient tolerated treatment well    Behavior During Therapy [facility] for tasks assessed/performed           Past Medical History:  Diagnosis Date  . Depression   . Diabetes mellitus   . Hyperlipidemia   . Hypertension   . Obesity   . PCOS (polycystic ovarian syndrome)   . Pulmonary embolism [facility])     Past Surgical History:  Procedure Laterality Date  . BREAST SURGERY     breast reduction  . CHOLECYSTECTOMY    . WISDOM TOOTH EXTRACTION      There were no vitals filed for this visit.   Subjective Assessment - 04/27/20 1614    Subjective Patient reports no significant changes in knee and hip pain that is still constant. Patient states that she had an increased R ankle/heel pain yesterday that was a 7/10, however today that pain is a 2/10.    Limitations Standing;Walking;House hold activities    How long can you sit comfortably? unlimited    How long can you stand comfortably? 10 min    How long can you walk comfortably? 3 min    Diagnostic tests CT scan, XRAY    Currently in Pain? Yes    Pain Score 2     Pain Location Ankle    Pain Orientation Right    Pain Descriptors / Indicators Aching;Tightness    Pain Onset More than a month ago             TREATMENT Therapeutic Exercise Walking program - 30 sec, 30 sec, 3 min, HR: 159 BPM. SpO2: 90% (at the lowest). Sitting rest between walking bouts. Elevated HR compared to previous  session due to pt able to maintain conversation while walking which previously pt has not been able to successfully do. Beginning of PT, pt required frequent standing rest breaks.  B Standing heel raises with UE support 2 x 10 - VC for patient to slowly lower legs back down (improving eccentric control) Gastroc knee to wall stretch with 15secH x 3  R seated gastroc towel stretch into DF  2 x 10 L seated gastroc towel stretch into DF  x 10  B calf stetch off 6" step with UE support 5 x 10secH     Performed exercises to address LE weakness      PT Education - 04/27/20 1617    Education Details form/technique with exercise. Exercises for signs/sx indicative of achilles tendinitis.    Person(s) Educated Patient    Methods Explanation;Demonstration    Comprehension Verbalized understanding;Returned demonstration            PT Short Term Goals - 04/25/20 1755      PT SHORT TERM GOAL #1   Title Patient will show independence with HEP to improve cardiovascular ability and LE strength    Baseline 02/29/20- dependent, 04/25/20: still working  on it    Time 3    Period Weeks    Status On-going    Target Date 03/21/20             PT Long Term Goals - 04/25/20 1759      PT LONG TERM GOAL #1   Title Patient will improve by 339ft to show improved cardivascular ability    Baseline 02/29/20- 673 ft    Time 6    Period Weeks    Status New      PT LONG TERM GOAL #2   Title Patient will improve performance in 30s sit to stand test to 15 reps show improved LE strength    Baseline 02/29/20- 9reps, 04/25/20: 11 reps    Time 6    Period Weeks    Status On-going      PT LONG TERM GOAL #3   Title Patient will be able to walk for 15 min without taking a break to show improved cardiavasuclar function    Baseline 02/29/20- 3 min, 04/25/20: 3 min    Time 6    Period Weeks    Status On-going      PT LONG TERM GOAL #4   Title FOTO goal                 Plan - 04/27/20 1618     Clinical Impression Statement Therapy session focused on exercises targeting the achilles tendon and cardiovascular actvities.Patient continues to show improved cardiovascular endurance and increased efficiency with ambulation during 2.5-12min walking program. However, patient demonstrates difficulty with ability to tolerate some gastroc stretching and gastroc/soleus calf raising exercises limited by post heel pain. Pt educated on use of ice, gastroc stretches, and heel raises to address heel pain that presents with signs/sx indicative of achilles tendinitis. Patient will further benefit from skilled therapy to return prior level of function.    Personal Factors and Comorbidities Comorbidity 2    Comorbidities Obesity, Diabetes    Examination-Activity Limitations Squat;Stairs;Stand;Other;Transfers;Locomotion Level    Examination-Participation Restrictions Occupation;Other    Stability/Clinical Decision Making Evolving/Moderate complexity    Rehab Potential Good    PT Frequency 2x / week    PT Duration 8 weeks    PT Treatment/Interventions ADLs/Self Care Home Management;Cryotherapy;Electrical Stimulation;Moist Heat;Ultrasound;Gait training;Stair training;Functional mobility training;Therapeutic activities;Therapeutic exercise;Balance training;Neuromuscular re-education;Manual techniques;Passive range of motion;Energy conservation;Spinal Manipulations;Joint Manipulations    PT Next Visit Plan Check balance, Hip IR/ER strength, LE strengthening    Consulted and Agree with Plan of Care Patient           Patient will benefit from skilled therapeutic intervention in order to improve the following deficits and impairments:  Abnormal gait,Cardiopulmonary status limiting activity,Decreased activity tolerance,Decreased balance,Decreased coordination,Decreased endurance,Decreased mobility,Decreased range of motion,Decreased strength,Difficulty walking,Hypomobility,Pain  Visit Diagnosis: Muscle weakness  (generalized)  Difficulty in walking, not elsewhere classified     Problem List Patient Active Problem List   Diagnosis Date Noted  . COVID-19 long hauler manifesting chronic concentration deficit 04/05/2020  . Hip pain 03/29/2020  . Pulmonary embolism (HCC) 12/28/2019  . Postural dizziness with presyncope 12/03/2019  . History of COVID-19 11/03/2019  . Shortness of breath 11/03/2019  . Acute respiratory failure with hypoxia (HCC) 11/03/2019  . Generalized anxiety disorder 02/23/2019  . Sunburn 08/21/2018  . Chronic pain of right knee 03/27/2017  . Transient adjustment reaction with anxiety 05/02/2015  . Edema 01/19/2013  . OSA (obstructive sleep apnea) 09/15/2012  . Obesity, morbid, BMI 50 or higher (HCC) 11/12/2011  .  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 POLYCYSTIC OVARIES 07/08/2009  . DEPRESSION, MAJOR, RECURRENT, MODERATE 07/08/2009    Delphia Grates. Fairly IV, PT, DPT Physical Therapist- [place]  [facility]  04/27/2020, 5:55 PM  [facility] PHYSICAL AND SPORTS MEDICINE [address], [place], [address] Phone: [phone]   Fax:  [phone]  Name: Kristy CREMEANS MRN: [id number] Date of Birth: Feb 09, 1975

## 2020-04-28 ENCOUNTER — Ambulatory Visit: Payer: BC Managed Care – PPO | Admitting: Speech Pathology

## 2020-05-02 ENCOUNTER — Other Ambulatory Visit: Payer: Self-pay | Admitting: Family Medicine

## 2020-05-02 DIAGNOSIS — G8929 Other chronic pain: Secondary | ICD-10-CM

## 2020-05-03 ENCOUNTER — Ambulatory Visit: Payer: BC Managed Care – PPO | Attending: Nurse Practitioner

## 2020-05-03 ENCOUNTER — Other Ambulatory Visit: Payer: Self-pay

## 2020-05-03 ENCOUNTER — Encounter: Payer: Self-pay | Admitting: Family Medicine

## 2020-05-03 DIAGNOSIS — M6281 Muscle weakness (generalized): Secondary | ICD-10-CM | POA: Diagnosis not present

## 2020-05-03 DIAGNOSIS — R262 Difficulty in walking, not elsewhere classified: Secondary | ICD-10-CM | POA: Insufficient documentation

## 2020-05-03 DIAGNOSIS — M25561 Pain in right knee: Secondary | ICD-10-CM

## 2020-05-03 DIAGNOSIS — G8929 Other chronic pain: Secondary | ICD-10-CM

## 2020-05-03 MED ORDER — DICLOFENAC SODIUM 75 MG PO TBEC: 75.0000 mg | DELAYED_RELEASE_TABLET | Freq: Two times a day (BID) | ORAL | 0 refills | Status: DC

## 2020-05-03 NOTE — Therapy (Signed)
[facility] PHYS[facility]ORTS MEDICINE [address] [place], [address] Phone: [phone]   Fax:  [phone]  Physical Therapy Treatment  Patient Details  Name: Kristy Conrad MRN: [id number] Date of Birth: 01-25-1975 No data recorded  Encounter Date: 05/03/2020   PT End of Session - 05/03/20 1418    Visit Number 12    Number of Visits 17    Date for PT Re-Evaluation 04/25/20    PT Start Time 1302    PT Stop Time 1345    PT Time Calculation (min) 43 min    Activity Tolerance Patient tolerated treatment well    Behavior During Therapy [facility] for tasks assessed/performed           Past Medical History:  Diagnosis Date  . Depression   . Diabetes mellitus   . Hyperlipidemia   . Hypertension   . Obesity   . PCOS (polycystic ovarian syndrome)   . Pulmonary embolism [facility])     Past Surgical History:  Procedure Laterality Date  . BREAST SURGERY     breast reduction  . CHOLECYSTECTOMY    . WISDOM TOOTH EXTRACTION      There were no vitals filed for this visit.   Subjective Assessment - 05/03/20 1415    Subjective Patient states that she has a little pain in her ankle today as a result of not doing much activity today prior to the therapy session. However, patient reports no pain by the end of the session. Patient also reports she feels that her R hip pain is getting better since she has been increasing her exercises for the ankle.    Limitations Standing;Walking;House hold activities    How long can you sit comfortably? unlimited    How long can you stand comfortably? 10 min    How long can you walk comfortably? 3 min    Diagnostic tests CT scan, XRAY    Currently in Pain? Yes    Pain Score 3     Pain Location Ankle    Pain Orientation Right;Lateral    Pain Descriptors / Indicators Aching    Pain Onset More than a month ago           TREATMENT Therapeutic Exercise  Walking program - 30 sec, 30 sec, 3 min,  HR: 153BPM. SpO2: 90% (at the lowest). Sitting rest between walking bouts. Elevated HR compared to previous session due to pt able to maintain conversation while walking which previously pt has not been able to successfully do. Beginning of PT, pt required frequent standing rest breaks.   B Standing heel raises with UE support 2 x 10  VC for patient to slowly lower legs back down (improving eccentric control) R seated gastroc towel stretch into DF  2 x 10 L seated gastroc towel stretch into DF 2  x 10  Bridges in hooklying - 3 x 10 B Hip abduction in sidelying  -- 3 x10 B Hip clamshells - 3 x10 B SLR in supine w/ 3 x 10       Performed exercises to address LE weakness      PT Education - 05/03/20 1418    Education Details form/technique with exercise    Person(s) Educated Patient    Methods Demonstration;Explanation    Comprehension Returned demonstration;Verbalized understanding            PT Short Term Goals - 04/25/20 1755      PT SHORT TERM  GOAL #1   Title Patient will show independence with HEP to improve cardiovascular ability and LE strength    Baseline 02/29/20- dependent, 04/25/20: still working on it    Time 3    Period Weeks    Status On-going    Target Date 03/21/20             PT Long Term Goals - 04/25/20 1759      PT LONG TERM GOAL #1   Title Patient will improve by 375ft to show improved cardivascular ability    Baseline 02/29/20- 673 ft    Time 6    Period Weeks    Status New      PT LONG TERM GOAL #2   Title Patient will improve performance in 30s sit to stand test to 15 reps show improved LE strength    Baseline 02/29/20- 9reps, 04/25/20: 11 reps    Time 6    Period Weeks    Status On-going      PT LONG TERM GOAL #3   Title Patient will be able to walk for 15 min without taking a break to show improved cardiavasuclar function    Baseline 02/29/20- 3 min, 04/25/20: 3 min    Time 6    Period Weeks    Status On-going      PT LONG TERM  GOAL #4   Title FOTO goal                 Plan - 05/03/20 1419    Clinical Impression Statement Patient shows improvement with ability to tolerate calf raises and gastroc towel stretches with a decrease in symptom reproduction. Patient demonstrates difficulty with 2.5-56min walking program during today's session resulting in a slower walking pace. Patient will further benefit from skilled therapy to return to prior level of function .    Personal Factors and Comorbidities Comorbidity 2    Comorbidities Obesity, Diabetes    Examination-Activity Limitations Squat;Stairs;Stand;Other;Transfers;Locomotion Level    Examination-Participation Restrictions Occupation;Other    Stability/Clinical Decision Making Evolving/Moderate complexity    Rehab Potential Good    PT Frequency 2x / week    PT Duration 8 weeks    PT Treatment/Interventions ADLs/Self Care Home Management;Cryotherapy;Electrical Stimulation;Moist Heat;Ultrasound;Gait training;Stair training;Functional mobility training;Therapeutic activities;Therapeutic exercise;Balance training;Neuromuscular re-education;Manual techniques;Passive range of motion;Energy conservation;Spinal Manipulations;Joint Manipulations    PT Next Visit Plan Check balance, Hip IR/ER strength, LE strengthening    Consulted and Agree with Plan of Care Patient           Patient will benefit from skilled therapeutic intervention in order to improve the following deficits and impairments:  Abnormal gait,Cardiopulmonary status limiting activity,Decreased activity tolerance,Decreased balance,Decreased coordination,Decreased endurance,Decreased mobility,Decreased range of motion,Decreased strength,Difficulty walking,Hypomobility,Pain  Visit Diagnosis: Muscle weakness (generalized)  Difficulty in walking, not elsewhere classified     Problem List Patient Active Problem List   Diagnosis Date Noted  . COVID-19 long hauler manifesting chronic concentration  deficit 04/05/2020  . Hip pain 03/29/2020  . Pulmonary embolism (HCC) 12/28/2019  . Postural dizziness with presyncope 12/03/2019  . History of COVID-19 11/03/2019  . Shortness of breath 11/03/2019  . Acute respiratory failure with hypoxia (HCC) 11/03/2019  . Generalized anxiety disorder 02/23/2019  . Sunburn 08/21/2018  . Chronic pain of right knee 03/27/2017  . Transient adjustment reaction with anxiety 05/02/2015  . Edema 01/19/2013  . OSA (obstructive sleep apnea) 09/15/2012  . Obesity, morbid, BMI 50 or higher (HCC) 11/12/2011  . Diabetes mellitus with renal complications (  HCC) 07/08/2009  . POLYCYSTIC OVARIES 07/08/2009  . DEPRESSION, MAJOR, RECURRENT, MODERATE 07/08/2009    Delphia Grates. Fairly IV, PT, DPT Physical Therapist- [place]  [facility]  05/03/2020, 2:56 PM  [facility] PHYSICAL AND SPORTS MEDICINE [address], [place], [address] Phone: [phone]   Fax:  [phone]  Name: Kristy Conrad MRN: [id number] Date of Birth: September 16, 1975

## 2020-05-03 NOTE — Progress Notes (Signed)
Virtual Visit via Telephone Note  I connected with Kristy Conrad on 05/03/20 at  2:15 PM EDT by telephone and verified that I am speaking with the correct person using two identifiers.  Location: Patient: home Provider: [facility] Pulmonary office [address] [place], [place] [place]   I discussed the limitations, risks, security and privacy concerns of performing an evaluation and management service by telephone and the availability of in person appointments. I also discussed with the patient that there may be a patient responsible charge related to this service. The patient expressed understanding and agreed to proceed.   History of Present Illness: 45 year old who had COVID infection some weeks ago with provoked PE and likely exacerbation of underlying asthma symptoms.  Started on Breo for ICS/LABA therapy in 02/2020.  Here for follow-up and review of recent PFTs.   Observations/Objective: Overall patient doing okay.  Dyspnea exertion slightly better.  Thinks Breo helps a little bit.  Is doing more physical therapy, being more active.  Thinks this is helping the most.  Reviewed in detail the results of her PFTs which are overall normal with spirometry suggestive moderate restriction without fixed obstruction, total lung capacity mildly reduced just below lower limit of normal with severely low ERV and normal DLCO Mo suggestive of chest wall interference/obesity.  Assessment and Plan: Dyspnea on exertion: Likely multifactorial related to asthma, deconditioning, obesity.  Symptoms getting better with increased physical activity.  Congratulated on this.  Recommended ongoing attempts at weight loss.  Continue Breo.  Follow Up Instructions:    I discussed the assessment and treatment plan with the patient. The patient was provided an opportunity to ask questions and all were answered. The patient agreed with the plan and demonstrated an understanding of the instructions.   The patient was advised to  call back or seek an in-person evaluation if the symptoms worsen or if the condition fails to improve as anticipated.  I provided 21 minutes of non-face-to-face time during this encounter.   Karren Burly, MD

## 2020-05-04 ENCOUNTER — Encounter: Payer: BC Managed Care – PPO | Admitting: Speech Pathology

## 2020-05-05 ENCOUNTER — Ambulatory Visit: Payer: BC Managed Care – PPO

## 2020-05-05 ENCOUNTER — Ambulatory Visit: Payer: BC Managed Care – PPO | Admitting: Family Medicine

## 2020-05-05 ENCOUNTER — Encounter: Payer: Self-pay | Admitting: Family Medicine

## 2020-05-05 ENCOUNTER — Other Ambulatory Visit: Payer: Self-pay

## 2020-05-05 ENCOUNTER — Other Ambulatory Visit: Payer: Self-pay | Admitting: Family Medicine

## 2020-05-05 VITALS — BP 122/70 | HR 98 | Temp 98.1°F | Ht 63.0 in | Wt 348.8 lb

## 2020-05-05 DIAGNOSIS — M6281 Muscle weakness (generalized): Secondary | ICD-10-CM

## 2020-05-05 DIAGNOSIS — M170 Bilateral primary osteoarthritis of knee: Secondary | ICD-10-CM | POA: Diagnosis not present

## 2020-05-05 DIAGNOSIS — R262 Difficulty in walking, not elsewhere classified: Secondary | ICD-10-CM

## 2020-05-05 MED ORDER — TRIAMCINOLONE ACETONIDE 40 MG/ML IJ SUSP
40.0000 mg | Freq: Once | INTRAMUSCULAR | Status: AC
  Administered 2020-05-05: 40 mg via INTRA_ARTICULAR

## 2020-05-05 NOTE — Progress Notes (Signed)
Emylee Decelle T. Ainsley Sanguinetti, MD, CAQ Sports Medicine  Primary Care and Sports Medicine [facility] [address] [place], [address]  Phone: [phone]  FAX: [phone]  [name] - 45 y.o. female  MRN [id number]  Date of Birth: 1975-05-10  Date: 05/05/2020  PCP: Lynnda Child, MD  Referral: Lynnda Child, MD  Chief Complaint  Patient presents with  . Knee Pain    Bilateral Injections    This visit occurred during the SARS-CoV-2 public health emergency.  Safety protocols were in place, including screening questions prior to the visit, additional usage of staff PPE, and extensive cleaning of exam room while observing appropriate contact time as indicated for disinfecting solutions.   Subjective:   Kristy Conrad is a 45 y.o. very pleasant female patient with Body mass index is 61.78 kg/m. who presents with the following:  B knee OA, advanced for age.  She is a very nice patient.  She is a former patient of my partner Dr. Dayton Martes.  She has unfortunately had a number of issues recently including COVID-19, COVID-pneumonia, and pulmonary embolism.  She is newly on Eliquis.  She does have known significant osteoarthritis of the knee.  I reviewed her plain films myself from 2019, she does have tricompartmental arthritis with severe medial compartmental narrowing.  Generally, she tries to be active, and she does teach some water aerobics.  She does have a loss of about 4 to 5 degrees of extension on the right.  On the left it is approximately 2 degrees.  She is able to flex her knees to approximately 95 degrees.  She has had a good response to steroid injections in the past, and I am going to do bilateral knee arthritis.  She does have diabetes, and her A1c is 8.  Review of Systems is noted in the HPI, as appropriate   Objective:   BP 122/70   Pulse 98   Temp 98.1 F (36.7 C) (Temporal)   Ht 5\' 3"  (1.6 m)   Wt (!) 348 lb 12 oz  (158.2 kg)   SpO2 96%   BMI 61.78 kg/m   Assessment and Plan:     ICD-10-CM   1. Primary osteoarthritis of knees, bilateral  M17.0 triamcinolone acetonide (KENALOG-40) injection 40 mg    triamcinolone acetonide (KENALOG-40) injection 40 mg   She is also interested in viscosupplementation.  We will get our office to check insurance approval.  Procedure only. No OV charge.  Aspiration/Injection Procedure Note Kristy Conrad 02/13/1975 Date of procedure: 05/05/2020  Procedure: Large Joint Joint Aspiration / Injection of the Right Knee Indications: Pain  Procedure Details Patient verbally consented to procedure. Risks, benefits, and alternatives explained. Sterilely prepped with Chloraprep. Ethyl cholride used for anesthesia. 9 cc Lidocaine 1% mixed with 1 mL Kenalog 40 mg injected using the anteromedial approach without difficulty. No complications with procedure and tolerated well. Patient had decreased pain post-injection.  Medication: 1 mL of Kenalog 40 mg  Aspiration/Injection Procedure Note Kristy Conrad 08-28-1975 Date of procedure: 05/05/2020  Procedure: Large Joint Aspiration / Injection of the Left Knee Indications: Pain  Procedure Details Patient verbally consented to procedure. Risks, benefits, and alternatives explained. Sterilely prepped with Chloraprep. Ethyl cholride used for anesthesia. 9 cc Lidocaine 1% mixed with 1 mL Kenalog 40 mg injected using the anteromedial approach without difficulty. No complications with procedure and tolerated well. Patient had decreased pain post-injection.  Medication: 1 mL of Kenalog  40 mg  Meds ordered this encounter  Medications  . triamcinolone acetonide (KENALOG-40) injection 40 mg  . triamcinolone acetonide (KENALOG-40) injection 40 mg   Signed,  Bettye Sitton T. Kryslyn Helbig, MD   Outpatient Encounter Medications as of 05/05/2020  Medication Sig  . albuterol (VENTOLIN HFA) 108 (90 Base) MCG/ACT inhaler TAKE 2 PUFFS BY MOUTH  EVERY 6 HOURS AS NEEDED FOR WHEEZE OR SHORTNESS OF BREATH  . apixaban (ELIQUIS) 5 MG TABS tablet Take 1 tablet (5 mg total) by mouth 2 (two) times daily.  . cetirizine (ZYRTEC) 10 MG tablet Take 10 mg by mouth daily.  . Cholecalciferol (VITAMIN D3) 50 MCG (2000 UT) TABS Take 50 mcg by mouth daily.  . COLLAGEN PO Take by mouth.  . diclofenac (VOLTAREN) 75 MG EC tablet Take 1 tablet (75 mg total) by mouth 2 (two) times daily.  Marland Kitchen FLUoxetine (PROZAC) 20 MG capsule TAKE 1 CAPSULE BY MOUTH EVERY DAY  . fluticasone furoate-vilanterol (BREO ELLIPTA) 200-25 MCG/INH AEPB Inhale 1 puff into the lungs daily.  . folic acid (FOLVITE) 1 MG tablet Take 1 mg by mouth daily.  Marland Kitchen glipiZIDE (GLUCOTROL) 5 MG tablet TAKE 1 TABLET (5 MG TOTAL) BY MOUTH 2 (TWO) TIMES DAILY BEFORE A MEAL.  Marland Kitchen Glucosamine HCl-MSM (GLUCOSAMINE-MSM PO) Take 1,500 mg by mouth daily.  Marland Kitchen lisinopril (ZESTRIL) 5 MG tablet TAKE ONE TABLET BY MOUTH DAILY  . metFORMIN (GLUCOPHAGE) 1000 MG tablet TAKE 1 TABLET BY MOUTH TWICE A DAY  . Turmeric Curcumin 500 MG CAPS Take 3 capsules by mouth daily.    Facility-Administered Encounter Medications as of 05/05/2020  Medication  . triamcinolone acetonide (KENALOG-40) injection 40 mg  . [COMPLETED] triamcinolone acetonide (KENALOG-40) injection 40 mg

## 2020-05-05 NOTE — Therapy (Signed)
[facility] [address], [place], [address] Phone: [phone]   Fax:  [phone]  Physical Therapy Treatment  Patient Details  Name: Kristy Conrad MRN: [id number] Date of Birth: 1975/03/15 No data recorded  Encounter Date: 05/05/2020   PT End of Session - 05/05/20 0913    Visit Number 13    Number of Visits 17    Date for PT Re-Evaluation 04/25/20    PT Start Time 0816    PT Stop Time 0900    PT Time Calculation (min) 44 min    Activity Tolerance Patient tolerated treatment well    Behavior During Therapy [facility] for tasks assessed/performed           Past Medical History:  Diagnosis Date  . Depression   . Diabetes mellitus   . Hyperlipidemia   . Hypertension   . Obesity   . PCOS (polycystic ovarian syndrome)   . Pulmonary embolism [facility])     Past Surgical History:  Procedure Laterality Date  . BREAST SURGERY     breast reduction  . CHOLECYSTECTOMY    . WISDOM TOOTH EXTRACTION      There were no vitals filed for this visit.   Subjective Assessment - 05/05/20 0909    Subjective Patient reports no increase in R ankle pain and her R hip pain is still gradually improving.    Limitations Standing;Walking;House hold activities    How long can you sit comfortably? unlimited    How long can you stand comfortably? 10 min    How long can you walk comfortably? 3 min    Diagnostic tests CT scan, XRAY    Currently in Pain? No/denies    Pain Onset More than a month ago          TREATMENT Therapeutic Exercise   Walking program - 30 sec, 30 sec, 3 min, HR: 146PM. SpO2: 88-89% (at the lowest). Sitting rest between walking bouts. Elevated HR compared to previous session due to pt able to maintain conversation while walking which previously pt has not been able to successfully do.  B Standing heel raises with UE support 2 x 10  VC for patient to slowly lower legs back down (improving  eccentric control) Bridges in hooklying - 3 x 10 B Hip abduction in sidelying  -- 3 x10 B Hip clamshells - 3 x10 B SLR in supine w/ 3 x 10 B Rotary hip abduction machine 40# 3 x 10  B Rotary hip extension machine 40# 3 x 10        Performed exercises to address LE weakness     PT Education - 05/05/20 0912    Education Details form/technique with exercise    Person(s) Educated Patient    Methods Explanation;Demonstration    Comprehension Returned demonstration;Verbalized understanding            PT Short Term Goals - 04/25/20 1755      PT SHORT TERM GOAL #1   Title Patient will show independence with HEP to improve cardiovascular ability and LE strength    Baseline 02/29/20- dependent, 04/25/20: still working on it    Time 3    Period Weeks    Status On-going    Target Date 03/21/20             PT Long Term Goals - 04/25/20 1759      PT LONG TERM GOAL #1   Title Patient will  improve by 388ft to show improved cardivascular ability    Baseline 02/29/20- 673 ft    Time 6    Period Weeks    Status New      PT LONG TERM GOAL #2   Title Patient will improve performance in 30s sit to stand test to 15 reps show improved LE strength    Baseline 02/29/20- 9reps, 04/25/20: 11 reps    Time 6    Period Weeks    Status On-going      PT LONG TERM GOAL #3   Title Patient will be able to walk for 15 min without taking a break to show improved cardiavasuclar function    Baseline 02/29/20- 3 min, 04/25/20: 3 min    Time 6    Period Weeks    Status On-going      PT LONG TERM GOAL #4   Title FOTO goal                 Plan - 05/05/20 0913    Clinical Impression Statement Today's therapy session focuses on hip and calf muscle strengthening exercises. Patient shows improvement with cardiovascular endurance and the ability to ambulate more effiecently today during the 2.5-55min walking program. Patient has some diffculty with hip abduction/extension machine exercises,  reporting increased sorness with multiple reps. Patient will further benefit from skilled therapy to return to prior level of function.    Personal Factors and Comorbidities Comorbidity 2    Comorbidities Obesity, Diabetes    Examination-Activity Limitations Squat;Stairs;Stand;Other;Transfers;Locomotion Level    Examination-Participation Restrictions Occupation;Other    Stability/Clinical Decision Making Evolving/Moderate complexity    Rehab Potential Good    PT Frequency 2x / week    PT Duration 8 weeks    PT Treatment/Interventions ADLs/Self Care Home Management;Cryotherapy;Electrical Stimulation;Moist Heat;Ultrasound;Gait training;Stair training;Functional mobility training;Therapeutic activities;Therapeutic exercise;Balance training;Neuromuscular re-education;Manual techniques;Passive range of motion;Energy conservation;Spinal Manipulations;Joint Manipulations    PT Next Visit Plan Check balance, Hip IR/ER strength, LE strengthening    Consulted and Agree with Plan of Care Patient           Patient will benefit from skilled therapeutic intervention in order to improve the following deficits and impairments:  Abnormal gait,Cardiopulmonary status limiting activity,Decreased activity tolerance,Decreased balance,Decreased coordination,Decreased endurance,Decreased mobility,Decreased range of motion,Decreased strength,Difficulty walking,Hypomobility,Pain  Visit Diagnosis: Muscle weakness (generalized)  Difficulty in walking, not elsewhere classified     Problem List Patient Active Problem List   Diagnosis Date Noted  . COVID-19 long hauler manifesting chronic concentration deficit 04/05/2020  . Hip pain 03/29/2020  . Pulmonary embolism (HCC) 12/28/2019  . Postural dizziness with presyncope 12/03/2019  . History of COVID-19 11/03/2019  . Shortness of breath 11/03/2019  . Acute respiratory failure with hypoxia (HCC) 11/03/2019  . Generalized anxiety disorder 02/23/2019  . Sunburn  08/21/2018  . Chronic pain of right knee 03/27/2017  . Transient adjustment reaction with anxiety 05/02/2015  . Edema 01/19/2013  . OSA (obstructive sleep apnea) 09/15/2012  . Obesity, morbid, BMI 50 or higher (HCC) 11/12/2011  . 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 POLYCYSTIC OVARIES 07/08/2009  . DEPRESSION, MAJOR, RECURRENT, MODERATE 07/08/2009    Delphia Grates. Fairly IV, PT, DPT Physical Therapist- [place]  [facility]  05/05/2020, 12:57 PM  [facility] PH[facility] SPORTS MEDICINE [address], [place], [address] Phone: [phone]   Fax:  [phone]  Name: Kristy Conrad MRN: [id number] Date of Birth: 1975-01-18

## 2020-05-09 ENCOUNTER — Ambulatory Visit: Payer: BC Managed Care – PPO

## 2020-05-10 ENCOUNTER — Encounter: Payer: BC Managed Care – PPO | Admitting: Speech Pathology

## 2020-05-11 ENCOUNTER — Other Ambulatory Visit: Payer: Self-pay

## 2020-05-11 ENCOUNTER — Ambulatory Visit: Payer: BC Managed Care – PPO

## 2020-05-11 DIAGNOSIS — M6281 Muscle weakness (generalized): Secondary | ICD-10-CM

## 2020-05-11 DIAGNOSIS — R262 Difficulty in walking, not elsewhere classified: Secondary | ICD-10-CM

## 2020-05-11 NOTE — Therapy (Signed)
[facility] [address] [place], [address] Phone: [phone]   Fax:  [phone]  Physical Therapy Treatment  Patient Details  Name: Kristy Conrad MRN: [id number] Date of Birth: Oct 14, 1975 No data recorded  Encounter Date: 05/11/2020   PT End of Session - 05/11/20 1323    Visit Number 14    Number of Visits 17    Date for PT Re-Evaluation 04/25/20    PT Start Time 1301    PT Stop Time 1344    PT Time Calculation (min) 43 min    Activity Tolerance Patient tolerated treatment well    Behavior During Therapy [facility] for tasks assessed/performed           Past Medical History:  Diagnosis Date  . Depression   . Diabetes mellitus   . Hyperlipidemia   . Hypertension   . Obesity   . PCOS (polycystic ovarian syndrome)   . Pulmonary embolism [facility])     Past Surgical History:  Procedure Laterality Date  . BREAST SURGERY     breast reduction  . CHOLECYSTECTOMY    . WISDOM TOOTH EXTRACTION      There were no vitals filed for this visit.   Subjective Assessment - 05/11/20 1302    Subjective Pt reports 1/10 pain NPS in R ankle. Missed MOnday due to migraine.    Limitations Standing;Walking;House hold activities    How long can you sit comfortably? unlimited    How long can you stand comfortably? 10 min    How long can you walk comfortably? 3 min    Diagnostic tests CT scan, XRAY    Currently in Pain? Yes    Pain Score 1     Pain Location Ankle    Pain Orientation Right;Lateral    Pain Descriptors / Indicators Aching    Pain Onset More than a month ago           TREATMENT Therapeutic Exercise   Walking program - 40 sec, 40 sec, 3 min, HR: 142 BPM. SpO2: 92% (at the lowest). Sitting rest between walking bouts.  HR elevated to 155 BPM on last wlaking bout. SPO2 > 93%.  B Standing heel raises with UE support. Up with 2 feet, down with R foot. 2 x 10  VC for patient to slowly lower  legs back down (improving eccentric control) B SLR in supine w/ 3 x 10, 3# AW's B Rotary hip abduction machine 40# 2 x 15 B Rotary hip extension machine 40# 2 x 10   PT Education - 05/11/20 1305    Education Details form/technique with exercise.    Person(s) Educated Patient    Methods Explanation;Demonstration;Tactile cues;Verbal cues    Comprehension Verbalized understanding;Returned demonstration            PT Short Term Goals - 04/25/20 1755      PT SHORT TERM GOAL #1   Title Patient will show independence with HEP to improve cardiovascular ability and LE strength    Baseline 02/29/20- dependent, 04/25/20: still working on it    Time 3    Period Weeks    Status On-going    Target Date 03/21/20             PT Long Term Goals - 04/25/20 1759      PT LONG TERM GOAL #1   Title Patient will improve by 345ft to show improved cardivascular ability    Baseline  02/29/20- 673 ft    Time 6    Period Weeks    Status New      PT LONG TERM GOAL #2   Title Patient will improve performance in 30s sit to stand test to 15 reps show improved LE strength    Baseline 02/29/20- 9reps, 04/25/20: 11 reps    Time 6    Period Weeks    Status On-going      PT LONG TERM GOAL #3   Title Patient will be able to walk for 15 min without taking a break to show improved cardiavasuclar function    Baseline 02/29/20- 3 min, 04/25/20: 3 min    Time 6    Period Weeks    Status On-going      PT LONG TERM GOAL #4   Title FOTO goal                 Plan - 05/11/20 1348    Clinical Impression Statement Pt's wlaking program and LE resistance exercises progressed today. Progressed wlaking bouts to 15 sec longer bouts, increasing reps, and progressed heel raises to up with both feet and eccentric control on RLE only. VItals throughout remained WNL's with O2 remaining > 90% and HR elevating up to 155 BPM with noted SOB after bouts of exercises. Pt will continue to benefit from skilled PT  services to return to PLOF.    Personal Factors and Comorbidities Comorbidity 2    Comorbidities Obesity, Diabetes    Examination-Activity Limitations Squat;Stairs;Stand;Other;Transfers;Locomotion Level    Examination-Participation Restrictions Occupation;Other    Stability/Clinical Decision Making Evolving/Moderate complexity    Clinical Decision Making Moderate    Rehab Potential Good    PT Frequency 2x / week    PT Duration 8 weeks    PT Treatment/Interventions ADLs/Self Care Home Management;Cryotherapy;Electrical Stimulation;Moist Heat;Ultrasound;Gait training;Stair training;Functional mobility training;Therapeutic activities;Therapeutic exercise;Balance training;Neuromuscular re-education;Manual techniques;Passive range of motion;Energy conservation;Spinal Manipulations;Joint Manipulations    PT Next Visit Plan Check balance, Hip IR/ER strength, LE strengthening    Consulted and Agree with Plan of Care Patient           Patient will benefit from skilled therapeutic intervention in order to improve the following deficits and impairments:  Abnormal gait,Cardiopulmonary status limiting activity,Decreased activity tolerance,Decreased balance,Decreased coordination,Decreased endurance,Decreased mobility,Decreased range of motion,Decreased strength,Difficulty walking,Hypomobility,Pain  Visit Diagnosis: Muscle weakness (generalized)  Difficulty in walking, not elsewhere classified     Problem List Patient Active Problem List   Diagnosis Date Noted  . COVID-19 long hauler manifesting chronic concentration deficit 04/05/2020  . Hip pain 03/29/2020  . Pulmonary embolism (HCC) 12/28/2019  . Postural dizziness with presyncope 12/03/2019  . History of COVID-19 11/03/2019  . Shortness of breath 11/03/2019  . Acute respiratory failure with hypoxia (HCC) 11/03/2019  . Generalized anxiety disorder 02/23/2019  . Sunburn 08/21/2018  . Chronic pain of right knee 03/27/2017  . Transient  adjustment reaction with anxiety 05/02/2015  . Edema 01/19/2013  . OSA (obstructive sleep apnea) 09/15/2012  . Obesity, morbid, BMI 50 or higher (HCC) 11/12/2011  . 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 POLYCYSTIC OVARIES 07/08/2009  . DEPRESSION, MAJOR, RECURRENT, MODERATE 07/08/2009    Delphia Grates Fairly, IV 05/11/2020, 1:56 PM   [facility]TS [facility] [address] [place], [address] Phone: [phone]   Fax:  [phone]  Name: ZAELYN NOACK MRN: [id number] Date of Birth: 08-Feb-1975

## 2020-05-12 ENCOUNTER — Encounter: Payer: BC Managed Care – PPO | Admitting: Speech Pathology

## 2020-05-13 ENCOUNTER — Telehealth: Payer: Self-pay

## 2020-05-13 NOTE — Telephone Encounter (Signed)
Hannah Beat, MD  Sherrie George, RN; Kizzie Ide, RN Could you check [employer] insurance preference and cost for her? She has completely fulfilled her deductible for this year. Thank-you.    Entered benefit information on [employer] website.. Will await response on benefits and pt cost for injections.

## 2020-05-16 ENCOUNTER — Ambulatory Visit: Payer: BC Managed Care – PPO

## 2020-05-16 ENCOUNTER — Other Ambulatory Visit: Payer: Self-pay

## 2020-05-16 DIAGNOSIS — M6281 Muscle weakness (generalized): Secondary | ICD-10-CM | POA: Diagnosis not present

## 2020-05-16 DIAGNOSIS — R262 Difficulty in walking, not elsewhere classified: Secondary | ICD-10-CM

## 2020-05-16 NOTE — Therapy (Signed)
[place] [facility] [address], [place], [address] Phone: [phone]   Fax:  [phone]  Physical Therapy Treatment  Patient Details  Name: Kristy Conrad MRN: [id number] Date of Birth: 1975-06-15 No data recorded  Encounter Date: 05/16/2020   PT End of Session - 05/16/20 1358    Visit Number 15    Number of Visits 17    Date for PT Re-Evaluation 04/25/20    PT Start Time 1348    PT Stop Time 1431    PT Time Calculation (min) 43 min    Activity Tolerance Patient tolerated treatment well;Patient limited by fatigue    Behavior During Therapy [facility] for tasks assessed/performed           Past Medical History:  Diagnosis Date  . Depression   . Diabetes mellitus   . Hyperlipidemia   . Hypertension   . Obesity   . PCOS (polycystic ovarian syndrome)   . Pulmonary embolism [facility])     Past Surgical History:  Procedure Laterality Date  . BREAST SURGERY     breast reduction  . CHOLECYSTECTOMY    . WISDOM TOOTH EXTRACTION      There were no vitals filed for this visit.   Subjective Assessment - 05/16/20 1357    Subjective No pain in R ankle. Some minor pain reported in B knees that pt attributes to her sleeping positioning.    Limitations Standing;Walking;House hold activities    How long can you sit comfortably? unlimited    How long can you stand comfortably? 10 min    How long can you walk comfortably? 3 min    Diagnostic tests CT scan, XRAY    Currently in Pain? Yes    Pain Score 1     Pain Location Knee    Pain Orientation Right;Left    Pain Descriptors / Indicators Aching    Pain Type Chronic pain    Pain Onset More than a month ago          Vitals prior: HR: 112 BPM SPO2: 96%  TREATMENT Therapeutic Exercise   Walking program - 3 min, 2 min 48 sec (x1 standing rest), 2 min (x1 standing rest), Max HR: 164 BPM. SpO2: 89% (at the lowest). Sitting rest between walking bouts. SPO2 > 90%  after seated rest with pursed lip breathing.   B Standing heel raises with UE support. Up with 2 feet, down with R foot. 2 x 15  VC for patient to slowly lower legs back down (improving eccentric control)   Standing with RTB around femoral condyles:  Lateral stepping for hip strength and walking tolerance: 2x10 reps, 10' Alternating marches: 2x10 reps/LE. VC's for decreasing speed and improving quality of motion.      Frequent seated rest breaks due to increased fatigue today limiting amount of exercise performed in today's session.   PT Education - 05/16/20 1357    Education Details form/technique with exercise.    Person(s) Educated Patient    Methods Explanation;Demonstration;Tactile cues;Verbal cues    Comprehension Verbalized understanding;Returned demonstration            PT Short Term Goals - 04/25/20 1755      PT SHORT TERM GOAL #1   Title Patient will show independence with HEP to improve cardiovascular ability and LE strength    Baseline 02/29/20- dependent, 04/25/20: still working on it    Time 3    Period Weeks  Status On-going    Target Date 03/21/20             PT Long Term Goals - 04/25/20 1759      PT LONG TERM GOAL #1   Title Patient will improve by 327ft to show improved cardivascular ability    Baseline 02/29/20- 673 ft    Time 6    Period Weeks    Status New      PT LONG TERM GOAL #2   Title Patient will improve performance in 30s sit to stand test to 15 reps show improved LE strength    Baseline 02/29/20- 9reps, 04/25/20: 11 reps    Time 6    Period Weeks    Status On-going      PT LONG TERM GOAL #3   Title Patient will be able to walk for 15 min without taking a break to show improved cardiavasuclar function    Baseline 02/29/20- 3 min, 04/25/20: 3 min    Time 6    Period Weeks    Status On-going      PT LONG TERM GOAL #4   Title FOTO goal                 Plan - 05/16/20 1449    Clinical Impression Statement  Approaching visit 17/17. PT plans to reassess ogals next visit and determine future PT POC going forward with pt. Pt demonstrated diffiuclty with wlaking program today with increased SOB, requiring stop breaks ins tanding, and a max HR of 164 BPM. SPO2 at lowest reached 89% but improved to > 90% with seated rest and pursed lip breathing in < 30 sec. Educated pt on exercises going forward will involve standing and dynamic strengtheningto improve pt's standing/walking tolerance and cardiopulmonary system. Will reassess goals next session and adapt future POC going forward.    Personal Factors and Comorbidities Comorbidity 2    Comorbidities Obesity, Diabetes    Examination-Activity Limitations Squat;Stairs;Stand;Other;Transfers;Locomotion Level    Examination-Participation Restrictions Occupation;Other    Stability/Clinical Decision Making Evolving/Moderate complexity    Rehab Potential Good    PT Frequency 2x / week    PT Duration 8 weeks    PT Treatment/Interventions ADLs/Self Care Home Management;Cryotherapy;Electrical Stimulation;Moist Heat;Ultrasound;Gait training;Stair training;Functional mobility training;Therapeutic activities;Therapeutic exercise;Balance training;Neuromuscular re-education;Manual techniques;Passive range of motion;Energy conservation;Spinal Manipulations;Joint Manipulations    PT Next Visit Plan RECERT!!!    Consulted and Agree with Plan of Care Patient           Patient will benefit from skilled therapeutic intervention in order to improve the following deficits and impairments:  Abnormal gait,Cardiopulmonary status limiting activity,Decreased activity tolerance,Decreased balance,Decreased coordination,Decreased endurance,Decreased mobility,Decreased range of motion,Decreased strength,Difficulty walking,Hypomobility,Pain  Visit Diagnosis: Muscle weakness (generalized)  Difficulty in walking, not elsewhere classified     Problem List Patient Active Problem List    Diagnosis Date Noted  . COVID-19 long hauler manifesting chronic concentration deficit 04/05/2020  . Hip pain 03/29/2020  . Pulmonary embolism (HCC) 12/28/2019  . Postural dizziness with presyncope 12/03/2019  . History of COVID-19 11/03/2019  . Shortness of breath 11/03/2019  . Acute respiratory failure with hypoxia (HCC) 11/03/2019  . Generalized anxiety disorder 02/23/2019  . Sunburn 08/21/2018  . Chronic pain of right knee 03/27/2017  . Transient adjustment reaction with anxiety 05/02/2015  . Edema 01/19/2013  . OSA (obstructive sleep apnea) 09/15/2012  . Obesity, morbid, BMI 50 or higher (HCC) 11/12/2011  . 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 POLYCYSTIC OVARIES  07/08/2009  . DEPRESSION, MAJOR, RECURRENT, MODERATE 07/08/2009    Delphia Grates. Fairly IV, PT, DPT Physical Therapist- [place]  [facility]  05/16/2020, 2:57 PM  [place] [facility] PHYSICAL AND SPORTS MEDICINE [address] [place], [address] Phone: [phone]   Fax:  [phone]  Name: Kristy Conrad MRN: [id number] Date of Birth: [birth year]

## 2020-05-17 ENCOUNTER — Encounter: Payer: BC Managed Care – PPO | Admitting: Speech Pathology

## 2020-05-18 ENCOUNTER — Ambulatory Visit: Payer: BC Managed Care – PPO

## 2020-05-18 ENCOUNTER — Other Ambulatory Visit: Payer: Self-pay

## 2020-05-18 DIAGNOSIS — M6281 Muscle weakness (generalized): Secondary | ICD-10-CM

## 2020-05-18 DIAGNOSIS — R262 Difficulty in walking, not elsewhere classified: Secondary | ICD-10-CM

## 2020-05-18 NOTE — Therapy (Signed)
[facility] PHYS[facility] SPORTS MEDICINE [address] [place], [address] Phone: [phone]   Fax:  [phone]  Physical Therapy Treatment/RECERT  Patient Details  Name: Kristy Conrad MRN: [id number] Date of Birth: 16-Jul-1975 No data recorded  Encounter Date: 05/18/2020   PT End of Session - 05/18/20 1551    Visit Number 16    Number of Visits 25    Date for PT Re-Evaluation 06/15/20    PT Start Time 1515    PT Stop Time 1555    PT Time Calculation (min) 40 min    Activity Tolerance Patient tolerated treatment well;Patient limited by fatigue    Behavior During Therapy [facility] for tasks assessed/performed           Past Medical History:  Diagnosis Date  . Depression   . Diabetes mellitus   . Hyperlipidemia   . Hypertension   . Obesity   . PCOS (polycystic ovarian syndrome)   . Pulmonary embolism [facility])     Past Surgical History:  Procedure Laterality Date  . BREAST SURGERY     breast reduction  . CHOLECYSTECTOMY    . WISDOM TOOTH EXTRACTION      There were no vitals filed for this visit.   Subjective Assessment - 05/18/20 1523    Subjective Pt reports pain in B knees at 1/10. No other concerns at this time.    Limitations Standing;Walking;House hold activities    How long can you sit comfortably? unlimited    How long can you stand comfortably? 10 min    How long can you walk comfortably? 3 min    Diagnostic tests CT scan, XRAY    Currently in Pain? Yes    Pain Score 1     Pain Location Knee    Pain Orientation Right;Left    Pain Descriptors / Indicators Aching    Pain Onset More than a month ago          Reassessment of goals today. Please refer to long term and short term goals and clinical impression for details.         PT Education - 05/18/20 1525    Education Details POC going forward.    Person(s) Educated Patient    Methods Explanation;Demonstration;Tactile cues;Verbal cues    Comprehension  Verbalized understanding;Returned demonstration            PT Short Term Goals - 05/18/20 1525      PT SHORT TERM GOAL #1   Title Patient will show independence with HEP to improve cardiovascular ability and LE strength    Baseline 02/29/20- dependent, 04/25/20: still working on it; 5/18: Performs parts of HEP everyday.    Time 3    Period Weeks    Status On-going    Target Date 06/15/20             PT Long Term Goals - 05/18/20 1526      PT LONG TERM GOAL #1   Title Patient will improve by 383ft to show improved cardivascular ability    Baseline 02/29/20- 673 ft; 5/18: 960'.5"    Time 4    Period Weeks    Status On-going    Target Date 06/15/20      PT LONG TERM GOAL #2   Title Patient will improve performance in 30s sit to stand test to 15 reps show improved LE strength    Baseline 02/29/20- 9reps, 04/25/20: 11 reps; 5/18: 17 reps  Time 6    Period Weeks    Status Achieved    Target Date 05/18/20      PT LONG TERM GOAL #3   Title Patient will be able to walk for 15 min without taking a break to show improved cardiavasuclar function    Baseline 02/29/20- 3 min, 04/25/20: 3 min; 05/18/20: 3 min, 15 sec.    Time 6    Period Weeks    Status On-going    Target Date 06/15/20      PT LONG TERM GOAL #4   Title Pt will improve FOTO to target score to indicate functional mobility improvements.    Baseline 04/06/20: 45/58; 05/18/20: 56/58    Time 4    Period Weeks    Status On-going    Target Date 06/15/20                 Plan - 05/18/20 1636    Clinical Impression Statement Pt making progress towards her goals. Pt improved distance to 960' with a goal of 973' and accomplished 30 sec STS goal to 17 reps indicating cliniclaly significant imporvment in LE strength and cardiopulmonary endurance. Pt is still limited in brief standing rest breaks at 3 min and 15 sec and unable to wlak for 15 min straight. Pt also progressing towards FOTO goal from 45 to 56 with a  goal of 58 indicaitng improvements in ADL's and functional mobility. PT plans to recert pt for an additional 4 weeks to continue endurance ans strenghening since pt is close to meeting all long term goals.    Personal Factors and Comorbidities Comorbidity 2    Comorbidities Obesity, Diabetes    Examination-Activity Limitations Squat;Stairs;Stand;Other;Transfers;Locomotion Level    Examination-Participation Restrictions Occupation;Other    Stability/Clinical Decision Making Evolving/Moderate complexity    Rehab Potential Good    PT Frequency 2x / week    PT Duration 8 weeks    PT Treatment/Interventions ADLs/Self Care Home Management;Cryotherapy;Electrical Stimulation;Moist Heat;Ultrasound;Gait training;Stair training;Functional mobility training;Therapeutic activities;Therapeutic exercise;Balance training;Neuromuscular re-education;Manual techniques;Passive range of motion;Energy conservation;Spinal Manipulations;Joint Manipulations    PT Next Visit Plan Progress walking program and LE strength    Consulted and Agree with Plan of Care Patient           Patient will benefit from skilled therapeutic intervention in order to improve the following deficits and impairments:  Abnormal gait,Cardiopulmonary status limiting activity,Decreased activity tolerance,Decreased balance,Decreased coordination,Decreased endurance,Decreased mobility,Decreased range of motion,Decreased strength,Difficulty walking,Hypomobility,Pain  Visit Diagnosis: Muscle weakness (generalized)  Difficulty in walking, not elsewhere classified     Problem List Patient Active Problem List   Diagnosis Date Noted  . COVID-19 long hauler manifesting chronic concentration deficit 04/05/2020  . Hip pain 03/29/2020  . Pulmonary embolism (HCC) 12/28/2019  . Postural dizziness with presyncope 12/03/2019  . History of COVID-19 11/03/2019  . Shortness of breath 11/03/2019  . Acute respiratory failure with hypoxia (HCC)  11/03/2019  . Generalized anxiety disorder 02/23/2019  . Sunburn 08/21/2018  . Chronic pain of right knee 03/27/2017  . Transient adjustment reaction with anxiety 05/02/2015  . Edema 01/19/2013  . OSA (obstructive sleep apnea) 09/15/2012  . Obesity, morbid, BMI 50 or higher (HCC) 11/12/2011  . 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 POLYCYSTIC OVARIES 07/08/2009  . DEPRESSION, MAJOR, RECURRENT, MODERATE 07/08/2009    Delphia Grates. Fairly IV, PT, DPT Physical Therapist- [place]  [facility]  05/18/2020, 4:44 PM  [facility] PHYSICAL AND SPORTS  MEDICINE [address], [place], [address] Phone: [phone]   Fax:  [phone]  Name: Kristy Conrad MRN: [id number] Date of Birth: [birth year]

## 2020-05-19 ENCOUNTER — Encounter: Payer: BC Managed Care – PPO | Admitting: Speech Pathology

## 2020-05-23 ENCOUNTER — Ambulatory Visit (INDEPENDENT_AMBULATORY_CARE_PROVIDER_SITE_OTHER): Payer: BC Managed Care – PPO | Admitting: Nurse Practitioner

## 2020-05-23 VITALS — BP 102/79 | HR 100 | Temp 97.9°F

## 2020-05-23 DIAGNOSIS — Z8616 Personal history of COVID-19: Secondary | ICD-10-CM

## 2020-05-23 NOTE — Patient Instructions (Signed)
History of COVID-19 Shortness of breath:  Glad you are improving   Stay well hydrated  Stay active  Deep breathing exercises  May start vitamin C daily, vitamin D3 daily, Zinc daily  May take tylenol or fever or pain  May take mucinex twice daily  Continue to follow with cardiology  Continue to follow with Pulmonary   Obesity:  Please reschedule with dietitian   Encouraged seeing nutrition again which she will doand working to make healthy choices. Cont PT and increasing exercising as tolerated   Chronic knee and hip pain:  improvement with PT   Please schedule with sports medicine to consider steroid injection    Follow up:  Follow up in2 months

## 2020-05-23 NOTE — Assessment & Plan Note (Signed)
Shortness of breath:  Glad you are improving   Stay well hydrated  Stay active  Deep breathing exercises  May start vitamin C daily, vitamin D3 daily, Zinc daily  May take tylenol or fever or pain  May take mucinex twice daily  Continue to follow with cardiology  Continue to follow with Pulmonary   Obesity:  Please reschedule with dietitian   Encouraged seeing nutrition again which she will doand working to make healthy choices. Cont PT and increasing exercising as tolerated   Chronic knee and hip pain:  improvement with PT   Please schedule with sports medicine to consider steroid injection    Follow up:  Follow up in2 months

## 2020-05-23 NOTE — Progress Notes (Signed)
@Patient  ID: , female    DOB: 15-Jul-1975, 45 y.o.   MRN: [id number]  Chief Complaint  Patient presents with  . Follow-up    Referring provider: [id number], MD    45 year old female with history of sleep apnea, diabetes, depression, and obesity. Diagnosed with Covid 10/13/19.  HPI  Patient presents today for post-COVID care clinic visit.  Patient was with tested positive for COVID in October 2021.  She states that overall she is feeling much better.  At her last visit she had had a little setback and has taken some time off work.  She states that she is currently ready to go back to work and feels back to normal.  She has been working with physical therapy.  We did do a referral at last visit to speech therapy for cognitive rehabilitation due to memory loss after COVID.  Patient underwent cognitive testing and did not qualify for cognitive rehabilitation.  She has been following up with sports medicine and did have a steroid injection in her knee recently.  She does continue to follow-up with cardiology and pulmonary. Denies f/c/s, n/v/d, hemoptysis, PND,  chest pain or edema.      Allergies  Allergen Reactions  . Morphine     REACTION: vomiting    Immunization History  Administered Date(s) Administered  . Influenza Split 10/27/2010, 11/12/2011, 11/06/2014  . Influenza Whole 10/12/2009  . Influenza,inj,Quad PF,6+ Mos 10/22/2012, 09/25/2013, 11/08/2018, 12/28/2019  . Influenza-Unspecified 11/18/2015, 10/23/2017  . PFIZER(Purple Top)SARS-COV-2 Vaccination 05/08/2019, 06/09/2019, 04/01/2020  . PPD Test 10/27/2010  . Pneumococcal Polysaccharide-23 10/20/2009, 11/01/2015  . Td 07/07/2008    Past Medical History:  Diagnosis Date  . Depression   . Diabetes mellitus   . Hyperlipidemia   . Hypertension   . Obesity   . PCOS (polycystic ovarian syndrome)   . Pulmonary embolism (HCC)     Tobacco History: Social History   Tobacco Use  Smoking Status Never  Smoker  Smokeless Tobacco Never Used   Counseling given: Yes   Outpatient Encounter Medications as of 05/23/2020  Medication Sig  . albuterol (VENTOLIN HFA) 108 (90 Base) MCG/ACT inhaler TAKE 2 PUFFS BY MOUTH EVERY 6 HOURS AS NEEDED FOR WHEEZE OR SHORTNESS OF BREATH  . apixaban (ELIQUIS) 5 MG TABS tablet Take 1 tablet (5 mg total) by mouth 2 (two) times daily.  . cetirizine (ZYRTEC) 10 MG tablet Take 10 mg by mouth daily.  . Cholecalciferol (VITAMIN D3) 50 MCG (2000 UT) TABS Take 50 mcg by mouth daily.  . COLLAGEN PO Take by mouth.  . diclofenac (VOLTAREN) 75 MG EC tablet Take 1 tablet (75 mg total) by mouth 2 (two) times daily.  05/25/2020 FLUoxetine (PROZAC) 20 MG capsule TAKE 1 CAPSULE BY MOUTH EVERY DAY  . fluticasone furoate-vilanterol (BREO ELLIPTA) 200-25 MCG/INH AEPB Inhale 1 puff into the lungs daily.  . folic acid (FOLVITE) 1 MG tablet Take 1 mg by mouth daily.  Marland Kitchen glipiZIDE (GLUCOTROL) 5 MG tablet TAKE 1 TABLET (5 MG TOTAL) BY MOUTH 2 (TWO) TIMES DAILY BEFORE A MEAL.  Marland Kitchen Glucosamine HCl-MSM (GLUCOSAMINE-MSM PO) Take 1,500 mg by mouth daily.  Marland Kitchen lisinopril (ZESTRIL) 5 MG tablet TAKE ONE TABLET BY MOUTH DAILY  . metFORMIN (GLUCOPHAGE) 1000 MG tablet TAKE 1 TABLET BY MOUTH TWICE A DAY  . Turmeric Curcumin 500 MG CAPS Take 3 capsules by mouth daily.    No facility-administered encounter medications on file as of 05/23/2020.     Review of Systems  Review of Systems  Constitutional: Negative.  Negative for fatigue and fever.  HENT: Negative.   Respiratory: Negative for cough and shortness of breath.   Cardiovascular: Negative.  Negative for chest pain, palpitations and leg swelling.  Gastrointestinal: Negative.   Allergic/Immunologic: Negative.   Neurological: Negative.   Psychiatric/Behavioral: Negative.        Physical Exam  BP 102/79   Pulse 100   Temp 97.9 F (36.6 C)   SpO2 98%   Wt Readings from Last 5 Encounters:  05/05/20 (!) 348 lb 12 oz (158.2 kg)  04/05/20 (!)  346 lb (156.9 kg)  03/30/20 (!) 346 lb (156.9 kg)  03/29/20 (!) 346 lb 8 oz (157.2 kg)  02/18/20 (!) 358 lb (162.4 kg)     Physical Exam Vitals and nursing note reviewed.  Constitutional:      General: She is not in acute distress.    Appearance: She is well-developed.  Cardiovascular:     Rate and Rhythm: Normal rate and regular rhythm.  Pulmonary:     Effort: Pulmonary effort is normal.     Breath sounds: Normal breath sounds.  Musculoskeletal:     Right lower leg: No edema.     Left lower leg: No edema.  Neurological:     Mental Status: She is alert and oriented to person, place, and time.  Psychiatric:        Mood and Affect: Mood normal.        Behavior: Behavior normal.        Assessment & Plan:   History of COVID-19 Shortness of breath:  Glad you are improving   Stay well hydrated  Stay active  Deep breathing exercises  May start vitamin C daily, vitamin D3 daily, Zinc daily  May take tylenol or fever or pain  May take mucinex twice daily  Continue to follow with cardiology  Continue to follow with Pulmonary   Obesity:  Please reschedule with dietitian   Encouraged seeing nutrition again which she will doand working to make healthy choices. Cont PT and increasing exercising as tolerated   Chronic knee and hip pain:  improvement with PT   Please schedule with sports medicine to consider steroid injection    Follow up:  Follow up in2 months     Ivonne Andrew, NP 05/23/2020

## 2020-05-24 ENCOUNTER — Encounter: Payer: BC Managed Care – PPO | Admitting: Speech Pathology

## 2020-05-24 ENCOUNTER — Other Ambulatory Visit: Payer: Self-pay

## 2020-05-24 ENCOUNTER — Ambulatory Visit: Payer: BC Managed Care – PPO

## 2020-05-24 DIAGNOSIS — R262 Difficulty in walking, not elsewhere classified: Secondary | ICD-10-CM

## 2020-05-24 DIAGNOSIS — M6281 Muscle weakness (generalized): Secondary | ICD-10-CM | POA: Diagnosis not present

## 2020-05-24 NOTE — Therapy (Signed)
[facility] PHYSICAL [facility] SPORTS MEDICINE [address] [place], [address] Phone: [phone]   Fax:  [phone]  Physical Therapy Treatment  Patient Details  Name: Kristy Conrad MRN: [id number] Date of Birth: 05/12/1975 No data recorded  Encounter Date: 05/24/2020   PT End of Session - 05/24/20 1413    Visit Number 17    Number of Visits 25    Date for PT Re-Evaluation 06/15/20    PT Start Time 1348    PT Stop Time 1430    PT Time Calculation (min) 42 min    Activity Tolerance Patient tolerated treatment well;Patient limited by fatigue    Behavior During Therapy [facility] for tasks assessed/performed           Past Medical History:  Diagnosis Date  . Depression   . Diabetes mellitus   . Hyperlipidemia   . Hypertension   . Obesity   . PCOS (polycystic ovarian syndrome)   . Pulmonary embolism [facility])     Past Surgical History:  Procedure Laterality Date  . BREAST SURGERY     breast reduction  . CHOLECYSTECTOMY    . WISDOM TOOTH EXTRACTION      There were no vitals filed for this visit.   Subjective Assessment - 05/24/20 1349    Subjective Pt reports 2/10 pain in R knee. 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 Most difficulty and pain with awkward sitting in stadium sititng and walking after concert.    Limitations Standing;Walking;House hold activities    How long can you sit comfortably? unlimited    How long can you stand comfortably? 10 min    How long can you walk comfortably? 3 min    Diagnostic tests CT scan, XRAY    Currently in Pain? Yes    Pain Score 2     Pain Location Knee    Pain Orientation Right    Pain Descriptors / Indicators Aching    Pain Onset More than a month ago          TREATMENT Therapeutic Exercise   Vitals prior:   HR: 113 BPM, SPO2: 96%  Walking program -  3 min 12 sec, SPO2: 88%, HR: 153 BPM. SPO2 > 90% after pursed lip breathing.  3 min, 7 sec, x1 standing rest due to SOB.  SPO2 > 94% HR: 144 BPM. Post second bout, HR: 156, SPO2: 93%. 3 min, 8 sec, SPO2: 92%, HR:151 BPM. X1 standing rest for ~30 sec. HR: 162 BPM, SPO2: 94%.   Sitting rest between walking bouts.  B Standing heel raises with UE support. Up with 2 feet, down with R foot. 3 x 10, 1x15.   Standing with RTB around femoral condyles:  Lateral stepping for hip strength and walking tolerance: 4x10 meters.  Supine SLR's with Chest and head supported by blue bolster for improved breathing: 1x12/LE, 3# AW's.      Pt required standing rests b/t exercises instead of seated rest breaks to improve standing tolerance to exercise.     PT Education - 05/24/20 1413    Education Details form/technique with exercise.    Person(s) Educated Patient    Methods Explanation;Demonstration;Verbal cues    Comprehension Verbalized understanding;Returned demonstration            PT Short Term Goals - 05/18/20 1525      PT SHORT TERM GOAL #1   Title Patient will show independence with HEP to improve cardiovascular ability and LE strength  Baseline 02/29/20- dependent, 04/25/20: still working on it; 5/18: Performs parts of HEP everyday.    Time 3    Period Weeks    Status On-going    Target Date 06/15/20             PT Long Term Goals - 05/18/20 1526      PT LONG TERM GOAL #1   Title Patient will improve by 38ft to show improved cardivascular ability    Baseline 02/29/20- 673 ft; 5/18: 960'.5"    Time 4    Period Weeks    Status On-going    Target Date 06/15/20      PT LONG TERM GOAL #2   Title Patient will improve performance in 30s sit to stand test to 15 reps show improved LE strength    Baseline 02/29/20- 9reps, 04/25/20: 11 reps; 5/18: 17 reps    Time 6    Period Weeks    Status Achieved    Target Date 05/18/20      PT LONG TERM GOAL #3   Title Patient will be able to walk for 15 min without taking a break to show improved cardiavasuclar function    Baseline 02/29/20- 3 min, 04/25/20:  3 min; 05/18/20: 3 min, 15 sec.    Time 6    Period Weeks    Status On-going    Target Date 06/15/20      PT LONG TERM GOAL #4   Title Pt will improve FOTO to target score to indicate functional mobility improvements.    Baseline 04/06/20: 45/58; 05/18/20: 56/58    Time 4    Period Weeks    Status On-going    Target Date 06/15/20                 Plan - 05/24/20 1424    Clinical Impression Statement Pt continuing to progress her tolerance to walking program and resistance training with ability to consistently walk > 3 min per bout. Noted LLE weakness in hip extension greater than RLE with decreased ROM and lumbar lordosis for compesnation. Pt requires intermittent VC's for decreasing speed of exercises to improve quality at target musculature. Pt can continue to beenfit from skilled PT services to progress LE strength and walking endurance so pt can return to PLOF.    Personal Factors and Comorbidities Comorbidity 2    Comorbidities Obesity, Diabetes    Examination-Activity Limitations Squat;Stairs;Stand;Other;Transfers;Locomotion Level    Examination-Participation Restrictions Occupation;Other    Stability/Clinical Decision Making Evolving/Moderate complexity    Rehab Potential Good    PT Frequency 2x / week    PT Duration 8 weeks    PT Treatment/Interventions ADLs/Self Care Home Management;Cryotherapy;Electrical Stimulation;Moist Heat;Ultrasound;Gait training;Stair training;Functional mobility training;Therapeutic activities;Therapeutic exercise;Balance training;Neuromuscular re-education;Manual techniques;Passive range of motion;Energy conservation;Spinal Manipulations;Joint Manipulations    PT Next Visit Plan Progress walking program and LE strength    Consulted and Agree with Plan of Care Patient           Patient will benefit from skilled therapeutic intervention in order to improve the following deficits and impairments:  Abnormal gait,Cardiopulmonary status limiting  activity,Decreased activity tolerance,Decreased balance,Decreased coordination,Decreased endurance,Decreased mobility,Decreased range of motion,Decreased strength,Difficulty walking,Hypomobility,Pain  Visit Diagnosis: Muscle weakness (generalized)  Difficulty in walking, not elsewhere classified     Problem List Patient Active Problem List   Diagnosis Date Noted  . COVID-19 long hauler manifesting chronic concentration deficit 04/05/2020  . Hip pain 03/29/2020  . Pulmonary embolism (HCC) 12/28/2019  . Postural dizziness with  presyncope 12/03/2019  . History of COVID-19 11/03/2019  . Shortness of breath 11/03/2019  . Acute respiratory failure with hypoxia (HCC) 11/03/2019  . Generalized anxiety disorder 02/23/2019  . Sunburn 08/21/2018  . Chronic pain of right knee 03/27/2017  . Transient adjustment reaction with anxiety 05/02/2015  . Edema 01/19/2013  . OSA (obstructive sleep apnea) 09/15/2012  . Obesity, morbid, BMI 50 or higher (HCC) 11/12/2011  . 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 POLYCYSTIC OVARIES 07/08/2009  . DEPRESSION, MAJOR, RECURRENT, MODERATE 07/08/2009    Delphia Grates. Fairly IV, PT, DPT Physical Therapist- [place]  [facility]  05/24/2020, 2:47 PM  [place] [facility] PHYSICAL AND SPORTS MEDICINE [address], [place], [address] Phone: [phone]   Fax:  [phone]  Name: Kristy Conrad MRN: [id number] Date of Birth: [birth year]

## 2020-05-24 NOTE — Telephone Encounter (Signed)
[employer] is requiring a prior authorization.  I have reached out to [facility] and her team. They are going to assist [place] with this process.  Waiting for outcome on PA.

## 2020-05-26 ENCOUNTER — Ambulatory Visit: Payer: BC Managed Care – PPO

## 2020-05-26 ENCOUNTER — Encounter: Payer: BC Managed Care – PPO | Admitting: Speech Pathology

## 2020-05-29 ENCOUNTER — Other Ambulatory Visit: Payer: Self-pay | Admitting: Family Medicine

## 2020-05-29 DIAGNOSIS — G8929 Other chronic pain: Secondary | ICD-10-CM

## 2020-05-31 ENCOUNTER — Encounter: Payer: BC Managed Care – PPO | Admitting: Speech Pathology

## 2020-06-02 ENCOUNTER — Encounter: Payer: BC Managed Care – PPO | Admitting: Speech Pathology

## 2020-06-02 ENCOUNTER — Ambulatory Visit: Payer: BC Managed Care – PPO | Attending: Nurse Practitioner

## 2020-06-02 ENCOUNTER — Other Ambulatory Visit: Payer: Self-pay

## 2020-06-02 ENCOUNTER — Ambulatory Visit: Payer: BC Managed Care – PPO

## 2020-06-02 DIAGNOSIS — R262 Difficulty in walking, not elsewhere classified: Secondary | ICD-10-CM

## 2020-06-02 DIAGNOSIS — M6281 Muscle weakness (generalized): Secondary | ICD-10-CM

## 2020-06-02 NOTE — Therapy (Signed)
[facility]ICINE [address] [place], [address] Phone: [phone]   Fax:  [phone]  Physical Therapy Treatment  Patient Details  Name: Kristy Conrad MRN: [id number] Date of Birth: 1975/06/28 No data recorded  Encounter Date: 06/02/2020   PT End of Session - 06/02/20 1546    Visit Number 18    Number of Visits 25    Date for PT Re-Evaluation 06/15/20    PT Start Time 1525    PT Stop Time 1600    PT Time Calculation (min) 35 min    Activity Tolerance Patient tolerated treatment well;Patient limited by fatigue;Patient limited by pain    Behavior During Therapy [facility] for tasks assessed/performed           Past Medical History:  Diagnosis Date  . Depression   . Diabetes mellitus   . Hyperlipidemia   . Hypertension   . Obesity   . PCOS (polycystic ovarian syndrome)   . Pulmonary embolism [facility])     Past Surgical History:  Procedure Laterality Date  . BREAST SURGERY     breast reduction  . CHOLECYSTECTOMY    . WISDOM TOOTH EXTRACTION      There were no vitals filed for this visit.   Subjective Assessment - 06/02/20 1528    Subjective Pt reports increased pain post work. General LE pain.    Limitations Standing;Walking;House hold activities    How long can you sit comfortably? unlimited    How long can you stand comfortably? 10 min    How long can you walk comfortably? 3 min    Diagnostic tests CT scan, XRAY    Currently in Pain? Yes    Pain Score 2     Pain Location Leg    Pain Orientation Right;Left    Pain Descriptors / Indicators Aching;Dull    Pain Type Chronic pain    Pain Onset More than a month ago          Therapeutic Exercise   Vitals prior:              HR: 94 BPM, SPO2: 96%   Walking program -  2 min 12 sec, SPO2: 92%, HR: 126 BPM.  2 min, 10 sec,SPO2 > 90% HR: 125 BPM.      Sitting rest between walking bouts.  Supine with BLE's elevated on step stool +2 pillows. B  ankle ABC's due to recent ankle swelling: x2/LE's. Supine SLR's: 4# DB's: 2x12/LE.  Supine SAQ with LE on bolster. 4# AW's, 1x15/LE Supine hamstring curls on red physio ball: 2x15 Hook lying: bridges with 8#'s of AW's on pelvis for added resistance.  Standing with GTB around femoral condyles: Lateral stepping for hip strength and walking tolerance: 2x10 meters.   PT Education - 06/02/20 1545    Education Details form/technique with exercise.    Person(s) Educated Patient    Methods Explanation;Demonstration;Verbal cues;Tactile cues    Comprehension Verbalized understanding;Returned demonstration            PT Short Term Goals - 05/18/20 1525      PT SHORT TERM GOAL #1   Title Patient will show independence with HEP to improve cardiovascular ability and LE strength    Baseline 02/29/20- dependent, 04/25/20: still working on it; 5/18: Performs parts of HEP everyday.    Time 3    Period Weeks    Status On-going    Target Date 06/15/20  PT Long Term Goals - 05/18/20 1526      PT LONG TERM GOAL #1   Title Patient will improve by 32ft to show improved cardivascular ability    Baseline 02/29/20- 673 ft; 5/18: 960'.5"    Time 4    Period Weeks    Status On-going    Target Date 06/15/20      PT LONG TERM GOAL #2   Title Patient will improve performance in 30s sit to stand test to 15 reps show improved LE strength    Baseline 02/29/20- 9reps, 04/25/20: 11 reps; 5/18: 17 reps    Time 6    Period Weeks    Status Achieved    Target Date 05/18/20      PT LONG TERM GOAL #3   Title Patient will be able to walk for 15 min without taking a break to show improved cardiavasuclar function    Baseline 02/29/20- 3 min, 04/25/20: 3 min; 05/18/20: 3 min, 15 sec.    Time 6    Period Weeks    Status On-going    Target Date 06/15/20      PT LONG TERM GOAL #4   Title Pt will improve FOTO to target score to indicate functional mobility improvements.    Baseline 04/06/20: 45/58;  05/18/20: 56/58    Time 4    Period Weeks    Status On-going    Target Date 06/15/20                 Plan - 06/02/20 1546    Clinical Impression Statement Due to ankle swelling since pt returned to work, utilized LE elevated exercises in supine to improve swelling and improve LE strength. Post session pt reports pain improvement. Limited in walking capacity today due to LE pain and due to pt being late to session. PT educated pt on elevation of LE's at home to further reduce swelling. PT will continue to benefit from skilled PT services to progress aerobic capacity and Le strength so pt can maximize capacity for functional mobility.    Personal Factors and Comorbidities Comorbidity 2    Comorbidities Obesity, Diabetes    Examination-Activity Limitations Squat;Stairs;Stand;Other;Transfers;Locomotion Level    Examination-Participation Restrictions Occupation;Other    Stability/Clinical Decision Making Evolving/Moderate complexity    Rehab Potential Good    PT Frequency 2x / week    PT Duration 8 weeks    PT Treatment/Interventions ADLs/Self Care Home Management;Cryotherapy;Electrical Stimulation;Moist Heat;Ultrasound;Gait training;Stair training;Functional mobility training;Therapeutic activities;Therapeutic exercise;Balance training;Neuromuscular re-education;Manual techniques;Passive range of motion;Energy conservation;Spinal Manipulations;Joint Manipulations    PT Next Visit Plan Progress walking program and LE strength    Consulted and Agree with Plan of Care Patient           Patient will benefit from skilled therapeutic intervention in order to improve the following deficits and impairments:  Abnormal gait,Cardiopulmonary status limiting activity,Decreased activity tolerance,Decreased balance,Decreased coordination,Decreased endurance,Decreased mobility,Decreased range of motion,Decreased strength,Difficulty walking,Hypomobility,Pain  Visit Diagnosis: Muscle weakness  (generalized)  Difficulty in walking, not elsewhere classified     Problem List Patient Active Problem List   Diagnosis Date Noted  . COVID-19 long hauler manifesting chronic concentration deficit 04/05/2020  . Hip pain 03/29/2020  . Pulmonary embolism (HCC) 12/28/2019  . Postural dizziness with presyncope 12/03/2019  . History of COVID-19 11/03/2019  . Shortness of breath 11/03/2019  . Acute respiratory failure with hypoxia (HCC) 11/03/2019  . Generalized anxiety disorder 02/23/2019  . Sunburn 08/21/2018  . Chronic pain of right knee 03/27/2017  .  Transient adjustment reaction with anxiety 05/02/2015  . Edema 01/19/2013  . OSA (obstructive sleep apnea) 09/15/2012  . Obesity, morbid, BMI 50 or higher (HCC) 11/12/2011  . 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 POLYCYSTIC OVARIES 07/08/2009  . DEPRESSION, MAJOR, RECURRENT, MODERATE 07/08/2009    Delphia Grates. Fairly IV, PT, DPT Physical Therapist- [place]  [facility]  06/02/2020, 4:07 PM  [place] [facility] PHYSICAL AND SPORTS MEDICINE [address] [place], [address] Phone: [phone]   Fax:  [phone]  Name: MAKHAYLA MCMURRY MRN: [id number] Date of Birth: Jan 12, 1975

## 2020-06-06 ENCOUNTER — Ambulatory Visit: Payer: BC Managed Care – PPO

## 2020-06-06 ENCOUNTER — Other Ambulatory Visit: Payer: Self-pay

## 2020-06-06 DIAGNOSIS — R262 Difficulty in walking, not elsewhere classified: Secondary | ICD-10-CM

## 2020-06-06 DIAGNOSIS — M6281 Muscle weakness (generalized): Secondary | ICD-10-CM

## 2020-06-06 NOTE — Therapy (Signed)
[place] [facility] PH[facility]ORTS MEDICINE [address], [place], [address] Phone: [phone]   Fax:  [phone]  Physical Therapy Treatment  Patient Details  Name: Kristy Conrad MRN: [id number] Date of Birth: 1975/11/08 No data recorded  Encounter Date: 06/06/2020   PT End of Session - 06/06/20 1539    Visit Number 19    Number of Visits 25    Date for PT Re-Evaluation 06/15/20    PT Start Time 1530    Activity Tolerance Patient tolerated treatment well;Patient limited by fatigue;Patient limited by pain    Behavior During Therapy [facility] for tasks assessed/performed           Past Medical History:  Diagnosis Date  . Depression   . Diabetes mellitus   . Hyperlipidemia   . Hypertension   . Obesity   . PCOS (polycystic ovarian syndrome)   . Pulmonary embolism [facility])     Past Surgical History:  Procedure Laterality Date  . BREAST SURGERY     breast reduction  . CHOLECYSTECTOMY    . WISDOM TOOTH EXTRACTION      There were no vitals filed for this visit.   Subjective Assessment - 06/06/20 1536    Subjective Pt reports increased LBP and R heel pain. 2/10 NPS. Able to walk further at school but having increased heel pain.    Limitations Standing;Walking;House hold activities    How long can you sit comfortably? unlimited    How long can you stand comfortably? 10 min    How long can you walk comfortably? 3 min    Diagnostic tests CT scan, XRAY    Currently in Pain? Yes    Pain Score 2     Pain Location Back    Pain Orientation Right    Pain Descriptors / Indicators Aching;Dull    Pain Type Chronic pain    Pain Onset More than a month ago           There.ex:   Supine bridges: 2x15   Supine ankle heel pumps with LE's elevated on bolster: 2x20   Supine ankle alphabet with LE's elevated on bolster: x2   Standing 2 feet eccentric heel raises with BUE support: 3x15 reps. VC's to improving eccentric control   Seated calf  raises with gait belt: 2x3 reps/LE with 30 sec holds.   Supine on Red physioball: 2x15/exercise.    Bridges    Hamstring curls       PT Education - 06/06/20 1538    Education Details form/technique with exercise.    Person(s) Educated Patient    Methods Explanation;Demonstration;Tactile cues    Comprehension Verbalized understanding;Returned demonstration            PT Short Term Goals - 05/18/20 1525      PT SHORT TERM GOAL #1   Title Patient will show independence with HEP to improve cardiovascular ability and LE strength    Baseline 02/29/20- dependent, 04/25/20: still working on it; 5/18: Performs parts of HEP everyday.    Time 3    Period Weeks    Status On-going    Target Date 06/15/20             PT Long Term Goals - 05/18/20 1526      PT LONG TERM GOAL #1   Title Patient will improve by 33ft to show improved cardivascular ability    Baseline 02/29/20- 673 ft; 5/18: 960'.5"    Time 4  Period Weeks    Status On-going    Target Date 06/15/20      PT LONG TERM GOAL #2   Title Patient will improve performance in 30s sit to stand test to 15 reps show improved LE strength    Baseline 02/29/20- 9reps, 04/25/20: 11 reps; 5/18: 17 reps    Time 6    Period Weeks    Status Achieved    Target Date 05/18/20      PT LONG TERM GOAL #3   Title Patient will be able to walk for 15 min without taking a break to show improved cardiavasuclar function    Baseline 02/29/20- 3 min, 04/25/20: 3 min; 05/18/20: 3 min, 15 sec.    Time 6    Period Weeks    Status On-going    Target Date 06/15/20      PT LONG TERM GOAL #4   Title Pt will improve FOTO to target score to indicate functional mobility improvements.    Baseline 04/06/20: 45/58; 05/18/20: 56/58    Time 4    Period Weeks    Status On-going    Target Date 06/15/20                 Plan - 06/06/20 1540    Clinical Impression Statement Continued R heel pain and LBP so focus of session on pain modulation. Pt  regresed from single heel raise eccentrics to B heel raise eccentrincs to modify.    Personal Factors and Comorbidities Comorbidity 2    Comorbidities Obesity, Diabetes    Examination-Activity Limitations Squat;Stairs;Stand;Other;Transfers;Locomotion Level    Examination-Participation Restrictions Occupation;Other    Stability/Clinical Decision Making Evolving/Moderate complexity    Rehab Potential Good    PT Frequency 2x / week    PT Duration 8 weeks    PT Treatment/Interventions ADLs/Self Care Home Management;Cryotherapy;Electrical Stimulation;Moist Heat;Ultrasound;Gait training;Stair training;Functional mobility training;Therapeutic activities;Therapeutic exercise;Balance training;Neuromuscular re-education;Manual techniques;Passive range of motion;Energy conservation;Spinal Manipulations;Joint Manipulations    PT Next Visit Plan Progress walking program and LE strength    Consulted and Agree with Plan of Care Patient           Patient will benefit from skilled therapeutic intervention in order to improve the following deficits and impairments:  Abnormal gait,Cardiopulmonary status limiting activity,Decreased activity tolerance,Decreased balance,Decreased coordination,Decreased endurance,Decreased mobility,Decreased range of motion,Decreased strength,Difficulty walking,Hypomobility,Pain  Visit Diagnosis: Muscle weakness (generalized)  Difficulty in walking, not elsewhere classified     Problem List Patient Active Problem List   Diagnosis Date Noted  . COVID-19 long hauler manifesting chronic concentration deficit 04/05/2020  . Hip pain 03/29/2020  . Pulmonary embolism (HCC) 12/28/2019  . Postural dizziness with presyncope 12/03/2019  . History of COVID-19 11/03/2019  . Shortness of breath 11/03/2019  . Acute respiratory failure with hypoxia (HCC) 11/03/2019  . Generalized anxiety disorder 02/23/2019  . Sunburn 08/21/2018  . Chronic pain of right knee 03/27/2017  . Transient  adjustment reaction with anxiety 05/02/2015  . Edema 01/19/2013  . OSA (obstructive sleep apnea) 09/15/2012  . Obesity, morbid, BMI 50 or higher (HCC) 11/12/2011  . 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 POLYCYSTIC OVARIES 07/08/2009  . DEPRESSION, MAJOR, RECURRENT, MODERATE 07/08/2009    Delphia Grates. Fairly IV, PT, DPT Physical Therapist- [place]  [facility]  06/06/2020, 3:42 PM  [place] [facility] PHYSICAL AND SPORTS MEDICINE [address], [place], [address] Phone: [phone]   Fax:  [phone]  Name: Kristy Conrad MRN:  [id number] Date of Birth: [birth year]

## 2020-06-07 NOTE — Telephone Encounter (Signed)
Left message for Kristy Conrad that she has been approved for the Synvisc injections.  I ask that she call the office to schedule an appointment with Dr. Patsy Lager at her convenience.

## 2020-06-07 NOTE — Telephone Encounter (Signed)
Fax from [facility] reporting [place] for injections of Synvisc has been approved. Ref # [id number], effective dates 06/05/20-12/01/20, approved for 96.0 units. Copy placed on Mandy, RN, desk, for folder, made copy and placed in scan folder. Sending msg to Lupita Leash to schedule pt for injection.

## 2020-06-09 NOTE — Telephone Encounter (Signed)
Spoke with Kristy Conrad.  She states she will call back to schedule when she is ready for the Synvisc Injections.

## 2020-06-13 ENCOUNTER — Ambulatory Visit: Payer: BC Managed Care – PPO | Admitting: Physical Therapy

## 2020-06-13 ENCOUNTER — Encounter: Payer: Self-pay | Admitting: Physical Therapy

## 2020-06-13 ENCOUNTER — Other Ambulatory Visit: Payer: Self-pay

## 2020-06-13 DIAGNOSIS — M6281 Muscle weakness (generalized): Secondary | ICD-10-CM | POA: Diagnosis not present

## 2020-06-13 DIAGNOSIS — R262 Difficulty in walking, not elsewhere classified: Secondary | ICD-10-CM

## 2020-06-13 NOTE — Therapy (Signed)
[place] [facility] PHYS[facility] SPORTS MEDICINE [address], [place], [address] Phone: [phone]   Fax:  [phone]  Physical Therapy Treatment  Patient Details  Name: Kristy Conrad MRN: [id number] Date of Birth: [birth year] No data recorded  Encounter Date: 06/13/2020   PT End of Session - 06/13/20 1637     Visit Number 20    Number of Visits 32    Date for PT Re-Evaluation 07/25/20    PT Start Time 1631    PT Stop Time 1713    PT Time Calculation (min) 42 min    Activity Tolerance Patient tolerated treatment well;No increased pain;Patient limited by fatigue    Behavior During Therapy [facility] for tasks assessed/performed             Past Medical History:  Diagnosis Date   Depression    Diabetes mellitus    Hyperlipidemia    Hypertension    Obesity    PCOS (polycystic ovarian syndrome)    Pulmonary embolism (HCC)     Past Surgical History:  Procedure Laterality Date   BREAST SURGERY     breast reduction   CHOLECYSTECTOMY     WISDOM TOOTH EXTRACTION      There were no vitals filed for this visit.   Subjective Assessment - 06/13/20 1635     Subjective Pt reports pain is low. 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    Limitations Standing;Walking;House hold activities    How long can you sit comfortably? unlimited    How long can you stand comfortably? 10 min    How long can you walk comfortably? 3 min    Diagnostic tests CT scan, XRAY    Currently in Pain? Yes    Pain Score 2     Pain Location Leg    Pain Orientation Right;Left    Pain Descriptors / Indicators Aching    Pain Type Chronic pain    Pain Onset More than a month ago             Today's session focused on reassessing goals and progressing further POC. Pt accomplished FOTO goal and declined in 6 MWT goal. Other walking goal adapted to 5 min due to pt having  difficulty to progress ability to meet walking 15 min without rest breaks. Please refer to clinical impression and goals section for updates.        PT Education - 06/13/20 1637     Education Details POC going forward.    Person(s) Educated Patient    Methods Explanation;Demonstration;Tactile cues;Verbal cues    Comprehension Verbalized understanding;Returned demonstration              PT Short Term Goals - 06/13/20 1638       PT SHORT TERM GOAL #1   Title Patient will show independence with HEP to improve cardiovascular ability and LE strength    Baseline 02/29/20- dependent, 04/25/20: still working on it; 5/18: Performs parts of HEP everyday.; 6/13: Parts of HEP 4-5x/week.    Time 3    Period Weeks    Status On-going    Target Date 06/13/20               PT Long Term Goals - 06/13/20 1656       PT LONG TERM GOAL #1   Title Patient will improve by 376ft to show  improved cardivascular ability    Baseline 02/29/20- 673 ft; 5/18: 960'.5"; 6/13: 838'    Time 4    Period Weeks    Status On-going    Target Date 07/25/20      PT LONG TERM GOAL #2   Title Patient will improve performance in 30s sit to stand test to 15 reps show improved LE strength    Baseline 02/29/20- 9reps, 04/25/20: 11 reps; 5/18: 17 reps    Time 6    Period Weeks    Status Achieved      PT LONG TERM GOAL #3   Title Patient will be able to walk for 5 min without taking a break to show improved cardiavasuclar function between an RPE of 6-8/10    Baseline 02/29/20- 3 min, 04/25/20: 3 min; 05/18/20: 3 min, 15 sec.; 6/13: 3 min, 30 sec    Time 6    Period Weeks    Status On-going    Target Date 07/25/20      PT LONG TERM GOAL #4   Title Pt will improve FOTO to target score to indicate functional mobility improvements.    Baseline 04/06/20: 45/58; 05/18/20: 56/58; 6/13: 58/58    Time 4    Period Weeks    Status Achieved    Target Date 06/13/20      PT LONG TERM GOAL #5   Title Pt will be  able to perform 10 m gait speed at 1.0 m/s to display improved ability to perform community distances.    Baseline 6/13: 0.85 m/s    Time 6    Period Weeks    Status New    Target Date 07/25/20                   Plan - 06/13/20 1721     Clinical Impression Statement Pt has regressed toward goal only able to attain 838' today compared to a previous 960' that pt reports due to fatigue from returning to work long hours. Pt also still limited by walking only 3 min 30 sec with a goal of 15 min. Goal modified to completing 5 min of walking due to difficulty attaining endurance up to 15 min. Pt however has accomplished FOTO score of 58 displaying improved functional mobility. New goal written for self selected gait speed to achieve up to 1.0 m/s to improve ability to perform community wlaking tasks. Today during testing pt scored 0.85 m/s indicating pt currently safe household ambulator. Overall pt has made progress in PT but is still limited in her endurance to fully complete community distance ADL's and walking tasks without SOB and fatigue. Pt will continue to benefit from an additional 6 weeks of PT to address impairments to maximize capacity for functional mobility.    Personal Factors and Comorbidities Comorbidity 2    Comorbidities Obesity, Diabetes    Examination-Activity Limitations Squat;Stairs;Stand;Other;Transfers;Locomotion Level    Examination-Participation Restrictions Occupation;Other    Stability/Clinical Decision Making Evolving/Moderate complexity    Clinical Decision Making Moderate    Rehab Potential Good    PT Frequency 2x / week    PT Duration 8 weeks    PT Treatment/Interventions ADLs/Self Care Home Management;Cryotherapy;Electrical Stimulation;Moist Heat;Ultrasound;Gait training;Stair training;Functional mobility training;Therapeutic activities;Therapeutic exercise;Balance training;Neuromuscular re-education;Manual techniques;Passive range of motion;Energy  conservation;Spinal Manipulations;Joint Manipulations    PT Next Visit Plan Further address endurance deficits. Progress walking program.    Consulted and Agree with Plan of Care Patient  Patient will benefit from skilled therapeutic intervention in order to improve the following deficits and impairments:  Abnormal gait, Cardiopulmonary status limiting activity, Decreased activity tolerance, Decreased balance, Decreased coordination, Decreased endurance, Decreased mobility, Decreased range of motion, Decreased strength, Difficulty walking, Hypomobility, Pain  Visit Diagnosis: Muscle weakness (generalized)  Difficulty in walking, not elsewhere classified     Problem List Patient Active Problem List   Diagnosis Date Noted   COVID-19 long hauler manifesting chronic concentration deficit 04/05/2020   Hip pain 03/29/2020   Pulmonary embolism (HCC) 12/28/2019   Postural dizziness with presyncope 12/03/2019   History of COVID-19 11/03/2019   Shortness of breath 11/03/2019   Acute respiratory failure with hypoxia (HCC) 11/03/2019   Generalized anxiety disorder 02/23/2019   Sunburn 08/21/2018   Chronic pain of right knee 03/27/2017   Transient adjustment reaction with anxiety 05/02/2015   Edema 01/19/2013   OSA (obstructive sleep apnea) 09/15/2012   Obesity, morbid, BMI 50 or higher (HCC) 11/12/2011   Diabetes mellitus with renal complications (HCC) 07/08/2009   POLYCYSTIC OVARIES 07/08/2009   DEPRESSION, MAJOR, RECURRENT, MODERATE 07/08/2009    Delphia Grates. Fairly IV, PT, DPT Physical Therapist- [place]  [facility]  06/13/2020, 5:34 PM  [place] [facility] PHYSICAL AND SPORTS MEDICINE [address], [place], [address] Phone: [phone]   Fax:  [phone]  Name: AVANTIKA SHERE MRN: [id number] Date of Birth: 03/30/1975

## 2020-06-16 ENCOUNTER — Encounter: Payer: Self-pay | Admitting: Physical Therapy

## 2020-06-16 ENCOUNTER — Other Ambulatory Visit: Payer: Self-pay

## 2020-06-16 ENCOUNTER — Ambulatory Visit: Payer: BC Managed Care – PPO | Admitting: Physical Therapy

## 2020-06-16 DIAGNOSIS — M6281 Muscle weakness (generalized): Secondary | ICD-10-CM | POA: Diagnosis not present

## 2020-06-16 DIAGNOSIS — R262 Difficulty in walking, not elsewhere classified: Secondary | ICD-10-CM

## 2020-06-16 NOTE — Therapy (Signed)
[place] [facility] [address] [place], [address] Phone: [phone]   Fax:  [phone]  Physical Therapy Treatment  Patient Details  Name: Kristy Conrad MRN: [id number] Date of Birth: July 31, 1975 No data recorded  Encounter Date: 06/16/2020   PT End of Session - 06/16/20 0948     Visit Number 21    Number of Visits 32    Date for PT Re-Evaluation 07/25/20    PT Start Time 0847    PT Stop Time 0934    PT Time Calculation (min) 47 min    Activity Tolerance Patient tolerated treatment well;Patient limited by pain    Behavior During Therapy [facility] for tasks assessed/performed             Past Medical History:  Diagnosis Date   Depression    Diabetes mellitus    Hyperlipidemia    Hypertension    Obesity    PCOS (polycystic ovarian syndrome)    Pulmonary embolism (HCC)     Past Surgical History:  Procedure Laterality Date   BREAST SURGERY     breast reduction   CHOLECYSTECTOMY     WISDOM TOOTH EXTRACTION      There were no vitals filed for this visit.   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    Limitations Standing;Walking;House hold activities    How long can you sit comfortably? unlimited    How long can you stand comfortably? 10 min    How long can you walk comfortably? 3 min    Diagnostic tests CT scan, XRAY    Currently in Pain? Yes    Pain Score 4     Pain Location Leg    Pain Descriptors / Indicators Aching    Pain Type Chronic pain    Pain Onset More than a month ago             There.ex:   Seated exercises:    B knee AROM in flexion/extension on soccer ball: 1x20/LE    B ankle tilt board in ankle PF/DF/INV/EVER 1x20/direction/LE     Hook lying with LE's on red physioball:    Hamstring curls: 2x15   Bridges: 2x15    Heel raises on BLE's, slow  eccentric descent on RLE: 1x12. Pt reports increased pain today    Walking program: one bout of 3 min, 18 sec with no rest breaks during bout. VC's for pursed lip breathing throughout.  Vitals prior walking: HR: 110 BPM, SPO2: 93%. Vitals post walking: HR: 150 BPM, SPO2: 87%. With seated rest after 1 min, HR returned to 107-108 BPM, SPO2 > 90%.     PT Education - 06/16/20 0947     Education Details form/technique with exercise.    Person(s) Educated Patient    Methods Explanation;Demonstration;Tactile cues;Verbal cues    Comprehension Verbalized understanding;Returned demonstration              PT Short Term Goals - 06/13/20 1638       PT SHORT TERM GOAL #1   Title Patient will show independence with HEP to improve cardiovascular ability and LE strength    Baseline 02/29/20- dependent, 04/25/20: still working on it; 5/18: Performs parts of HEP everyday.; 6/13: Parts of HEP 4-5x/week.    Time 3    Period Weeks    Status  On-going    Target Date 06/13/20               PT Long Term Goals - 06/13/20 1656       PT LONG TERM GOAL #1   Title Patient will improve by 366ft to show improved cardivascular ability    Baseline 02/29/20- 673 ft; 5/18: 960'.5"; 6/13: 838'    Time 4    Period Weeks    Status On-going    Target Date 07/25/20      PT LONG TERM GOAL #2   Title Patient will improve performance in 30s sit to stand test to 15 reps show improved LE strength    Baseline 02/29/20- 9reps, 04/25/20: 11 reps; 5/18: 17 reps    Time 6    Period Weeks    Status Achieved      PT LONG TERM GOAL #3   Title Patient will be able to walk for 5 min without taking a break to show improved cardiavasuclar function between an RPE of 6-8/10    Baseline 02/29/20- 3 min, 04/25/20: 3 min; 05/18/20: 3 min, 15 sec.; 6/13: 3 min, 30 sec    Time 6    Period Weeks    Status On-going    Target Date 07/25/20      PT LONG TERM GOAL #4   Title Pt will improve FOTO to target score to indicate  functional mobility improvements.    Baseline 04/06/20: 45/58; 05/18/20: 56/58; 6/13: 58/58    Time 4    Period Weeks    Status Achieved    Target Date 06/13/20      PT LONG TERM GOAL #5   Title Pt will be able to perform 10 m gait speed at 1.0 m/s to display improved ability to perform community distances.    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 Able to overall improve pain within session with gentle LE strengthening and AROM exercises. Pt able to tolerate ambulation 3 min 18 sec without any rest breaks but did have SPO2 at 88% and HR at 150 BPM. With seated rest and cuing for pursed lip breathing pt returned to > 90% SPO2 within one minute and resting HR of 105-107 BPM. Pt educated to continue to utilize ice for R heel pain and water aerobics to off load joints for further pain relief with ground weight bearing exercise due to acute increase in pains since returning to work. Pt verbalized understanding and will continue to benefit from skilled PT services to progress walking endurance and LE strength to optimize capacity for functional mobility.    Personal Factors and Comorbidities Comorbidity 2    Comorbidities Obesity, Diabetes    Examination-Activity Limitations Squat;Stairs;Stand;Other;Transfers;Locomotion Level    Examination-Participation Restrictions Occupation;Other    Stability/Clinical Decision Making Evolving/Moderate complexity    Rehab Potential Good    PT Frequency 2x / week    PT Duration 8 weeks    PT Treatment/Interventions ADLs/Self Care Home Management;Cryotherapy;Electrical Stimulation;Moist Heat;Ultrasound;Gait training;Stair training;Functional mobility training;Therapeutic activities;Therapeutic exercise;Balance  training;Neuromuscular re-education;Manual techniques;Passive range of motion;Energy conservation;Spinal Manipulations;Joint Manipulations    PT Next Visit Plan Further  address endurance deficits. Progress walking program.    Consulted and Agree with Plan of Care Patient             Patient will benefit from skilled therapeutic intervention in order to improve the following deficits and impairments:  Abnormal gait, Cardiopulmonary status limiting activity, Decreased activity tolerance, Decreased balance, Decreased coordination, Decreased endurance, Decreased mobility, Decreased range of motion, Decreased strength, Difficulty walking, Hypomobility, Pain  Visit Diagnosis: Muscle weakness (generalized)  Difficulty in walking, not elsewhere classified     Problem List Patient Active Problem List   Diagnosis Date Noted   COVID-19 long hauler manifesting chronic concentration deficit 04/05/2020   Hip pain 03/29/2020   Pulmonary embolism (HCC) 12/28/2019   Postural dizziness with presyncope 12/03/2019   History of COVID-19 11/03/2019   Shortness of breath 11/03/2019   Acute respiratory failure with hypoxia (HCC) 11/03/2019   Generalized anxiety disorder 02/23/2019   Sunburn 08/21/2018   Chronic pain of right knee 03/27/2017   Transient adjustment reaction with anxiety 05/02/2015   Edema 01/19/2013   OSA (obstructive sleep apnea) 09/15/2012   Obesity, morbid, BMI 50 or higher (HCC) 11/12/2011   Diabetes mellitus with renal complications (HCC) 07/08/2009   POLYCYSTIC OVARIES 07/08/2009   DEPRESSION, MAJOR, RECURRENT, MODERATE 07/08/2009    Delphia Grates. Fairly IV, PT, DPT Physical Therapist- [place]  [facility]  06/16/2020, 9:56 AM  [place] [facility] [address], [place], [address] Phone: [phone]   Fax:  [phone]  Name: Kristy Conrad MRN: [id number] Date of Birth:  Feb 27, 1975

## 2020-06-20 ENCOUNTER — Ambulatory Visit: Payer: BC Managed Care – PPO | Admitting: Physical Therapy

## 2020-06-20 ENCOUNTER — Encounter: Payer: Self-pay | Admitting: Physical Therapy

## 2020-06-20 ENCOUNTER — Other Ambulatory Visit: Payer: Self-pay

## 2020-06-20 DIAGNOSIS — R262 Difficulty in walking, not elsewhere classified: Secondary | ICD-10-CM

## 2020-06-20 DIAGNOSIS — M6281 Muscle weakness (generalized): Secondary | ICD-10-CM

## 2020-06-20 NOTE — Therapy (Signed)
[place] [facility] [address], [place], [address] Phone: [phone]   Fax:  [phone]  Physical Therapy Treatment  Patient Details  Name: Kristy Conrad MRN: [id number] Date of Birth: [birth year] No data recorded  Encounter Date: 06/20/2020   PT End of Session - 06/20/20 1606     Visit Number 22    Number of Visits 32    Date for PT Re-Evaluation 07/25/20    PT Start Time 1545    PT Stop Time 1630    PT Time Calculation (min) 45 min    Activity Tolerance Patient tolerated treatment well;Patient limited by pain    Behavior During Therapy [facility] for tasks assessed/performed             Past Medical History:  Diagnosis Date   Depression    Diabetes mellitus    Hyperlipidemia    Hypertension    Obesity    PCOS (polycystic ovarian syndrome)    Pulmonary embolism (HCC)     Past Surgical History:  Procedure Laterality Date   BREAST SURGERY     breast reduction   CHOLECYSTECTOMY     WISDOM TOOTH EXTRACTION      There were no vitals filed for this visit.   Subjective Assessment - 06/20/20 1552     Subjective Pt reports that she is not experiencing a lot of pain in her right ankle and she twisted her left knee trying to avoid cat. He knees were hurting over weekend and a little flare up in her right plantar fasciitis after doing pool exercises.    Limitations Standing;Walking;House hold activities    How long can you sit comfortably? unlimited    How long can you stand comfortably? 10 min    How long can you walk comfortably? 3 min    Diagnostic tests CT scan, XRAY    Currently in Pain? Yes    Pain Score 1     Pain Location Knee    Pain Orientation Left    Pain Descriptors / Indicators Aching    Pain Onset More than a month ago              THEREX:  Standing Hip Abduction with BUE support 2 x 10 -Vcs to maintain upright posture    Standing Hamstring Curls with BUE support 2 x 10    Standing Heel Raises with BUE support 2 x 10   Sit to Stand 2 x 10  -Vcs for quickly standing and slowly sitting down   Single Leg Stance 2 x 30 sec   Educated on use of RPE scale: -Moderate Activity= cannot sing favorite song=4-6 on RPE scale  -Vigorous Activity-Talk test= cannot say more than a few words out loud= 7-8 on RPE scale   Pt instructed to apply RPE to aerobic activity and to start walking program if she has not already engaged in one.     PT Short Term Goals - 06/20/20 1647       PT SHORT TERM GOAL #1   Title Patient will show independence with HEP to improve cardiovascular ability and LE strength    Baseline 02/29/20- dependent, 04/25/20: still working on it; 5/18: Performs parts of HEP everyday.; 6/13: Parts of HEP 4-5x/week.    Time 3    Period Weeks    Status On-going    Target Date 06/13/20               PT  Long Term Goals - 06/20/20 1648       PT LONG TERM GOAL #1   Title Patient will improve by 372ft to show improved cardivascular ability    Baseline 02/29/20- 673 ft; 5/18: 960'.5"; 6/13: 838'    Time 4    Period Weeks    Status On-going      PT LONG TERM GOAL #2   Title Patient will improve performance in 30s sit to stand test to 15 reps show improved LE strength    Baseline 02/29/20- 9reps, 04/25/20: 11 reps; 5/18: 17 reps    Time 6    Period Weeks    Status Achieved      PT LONG TERM GOAL #3   Title Patient will be able to walk for 5 min without taking a break to show improved cardiavasuclar function between an RPE of 6-8/10    Baseline 02/29/20- 3 min, 04/25/20: 3 min; 05/18/20: 3 min, 15 sec.; 6/13: 3 min, 30 sec    Time 6    Period Weeks    Status On-going      PT LONG TERM GOAL #4   Title Pt will improve FOTO to target score to indicate functional mobility improvements.    Baseline 04/06/20: 45/58; 05/18/20: 56/58; 6/13: 58/58    Time 4    Period Weeks    Status Achieved      PT LONG TERM GOAL #5   Title Pt will be able to  perform 10 m gait speed at 1.0 m/s to display improved ability to perform community distances.    Baseline 6/13: 0.85 m/s    Time 6    Period Weeks    Status New                   Plan - 06/20/20 1607     Clinical Impression Statement Pt demonstrates improvement in ankle pain for today's session with a slight increase in left knee pain after avoiding tripping on cat early in morning at her house. Pt able to progress strengthening exercises to standing position without an increase in pain. Pt educated on RPE scale to aim for moderate intensity exercise for next week with aerobic activity. It was recommended that if she does not have an aerobic activity, that she engage in walking program and to start at 2 minutes for next week. Pt will continue to benefit from skilled PT to progress LE strength and endurance, to increase aerobic capacity, and to decrease pain in order to ambulate within pain free range to carry out grocery shopping and community level ambulation.    Personal Factors and Comorbidities Comorbidity 2    Comorbidities Obesity, Diabetes    Examination-Activity Limitations Squat;Stairs;Stand;Other;Transfers;Locomotion Level    Examination-Participation Restrictions Occupation;Other    Stability/Clinical Decision Making Evolving/Moderate complexity    Rehab Potential Good    PT Frequency 2x / week    PT Duration 8 weeks    PT Treatment/Interventions ADLs/Self Care Home Management;Cryotherapy;Electrical Stimulation;Moist Heat;Ultrasound;Gait training;Stair training;Functional mobility training;Therapeutic activities;Therapeutic exercise;Balance training;Neuromuscular re-education;Manual techniques;Passive range of motion;Energy conservation;Spinal Manipulations;Joint Manipulations    PT Next Visit Plan Observe pt engaging in aerobic exercise with use of RPE scale and progress strengthening exercises to include dynamic walking exercises that increase aerobic intensity.    PT Home  Exercise Plan Access Code: [facility]    Consulted and Agree with Plan of Care Patient            HEP includes the following exercises:  Access  Code: [facility] URL: [email or web address] Date: 06/20/2020 Prepared by: Ellin Goodie  Exercises Standing March with Counter Support - 1 x daily - 3 x weekly - 3 sets - 10 reps Heel rises with counter support - 1 x daily - 3 x weekly - 3 sets - 10 reps Standing Alternating Knee Flexion - 1 x daily - 3 x weekly - 3 sets - 10 reps Sit to Stand - 1 x daily - 3 x weekly - 3 sets - 10 reps Standing Hip Abduction with Counter Support - 1 x daily - 3 x weekly - 3 sets - 10 reps Single Leg Stance - 1 x daily - 3 x weekly - 1 sets - 5 reps - 30 hold   Patient will benefit from skilled therapeutic intervention in order to improve the following deficits and impairments:  Abnormal gait, Cardiopulmonary status limiting activity, Decreased activity tolerance, Decreased balance, Decreased coordination, Decreased endurance, Decreased mobility, Decreased range of motion, Decreased strength, Difficulty walking, Hypomobility, Pain  Visit Diagnosis: Difficulty in walking, not elsewhere classified  Muscle weakness (generalized)    Problem List Patient Active Problem List   Diagnosis Date Noted   COVID-19 long hauler manifesting chronic concentration deficit 04/05/2020   Hip pain 03/29/2020   Pulmonary embolism (HCC) 12/28/2019   Postural dizziness with presyncope 12/03/2019   History of COVID-19 11/03/2019   Shortness of breath 11/03/2019   Acute respiratory failure with hypoxia (HCC) 11/03/2019   Generalized anxiety disorder 02/23/2019   Sunburn 08/21/2018   Chronic pain of right knee 03/27/2017   Transient adjustment reaction with anxiety 05/02/2015   Edema 01/19/2013   OSA (obstructive sleep apnea) 09/15/2012   Obesity, morbid, BMI 50 or higher (HCC) 11/12/2011   Diabetes mellitus with renal complications (HCC) 07/08/2009    POLYCYSTIC OVARIES 07/08/2009   DEPRESSION, MAJOR, RECURRENT, MODERATE 07/08/2009   Ellin Goodie PT, DPT  06/20/2020, 5:11 PM  [place] [facility] [address], [place], [address] Phone: [phone]   Fax:  [phone]  Name: Kristy Conrad MRN: [id number] Date of Birth: 12/19/1975

## 2020-06-22 ENCOUNTER — Ambulatory Visit: Payer: BC Managed Care – PPO | Admitting: Physical Therapy

## 2020-06-24 ENCOUNTER — Other Ambulatory Visit: Payer: Self-pay | Admitting: Family Medicine

## 2020-06-24 DIAGNOSIS — F331 Major depressive disorder, recurrent, moderate: Secondary | ICD-10-CM

## 2020-06-24 DIAGNOSIS — F411 Generalized anxiety disorder: Secondary | ICD-10-CM

## 2020-06-27 ENCOUNTER — Other Ambulatory Visit: Payer: Self-pay

## 2020-06-27 ENCOUNTER — Ambulatory Visit: Payer: BC Managed Care – PPO | Admitting: Physical Therapy

## 2020-06-27 DIAGNOSIS — M6281 Muscle weakness (generalized): Secondary | ICD-10-CM | POA: Diagnosis not present

## 2020-06-27 DIAGNOSIS — R262 Difficulty in walking, not elsewhere classified: Secondary | ICD-10-CM

## 2020-06-27 NOTE — Therapy (Signed)
[place] [facility] [address] [place], [address] Phone: [phone]   Fax:  [phone]  Physical Therapy Treatment  Patient Details  Name: Kristy Conrad MRN: [id number] Date of Birth: [birth year] No data recorded  Encounter Date: 06/27/2020   PT End of Session - 06/27/20 2128     Visit Number 23    Number of Visits 32    Date for PT Re-Evaluation 07/25/20    PT Start Time 1545    PT Stop Time 1630    PT Time Calculation (min) 45 min    Activity Tolerance Patient tolerated treatment well;Patient limited by pain    Behavior During Therapy [facility] for tasks assessed/performed             Past Medical History:  Diagnosis Date   Depression    Diabetes mellitus    Hyperlipidemia    Hypertension    Obesity    PCOS (polycystic ovarian syndrome)    Pulmonary embolism (HCC)     Past Surgical History:  Procedure Laterality Date   BREAST SURGERY     breast reduction   CHOLECYSTECTOMY     WISDOM TOOTH EXTRACTION      There were no vitals filed for this visit.   Subjective Assessment - 06/27/20 1548     Subjective Pt reports that her ankles and hips have been hurting her more lately, and she does not know why.    Limitations Standing;Walking;House hold activities    How long can you sit comfortably? unlimited    How long can you stand comfortably? 10 min    How long can you walk comfortably? 3 min    Diagnostic tests CT scan, XRAY    Currently in Pain? Yes    Pain Score 1     Pain Location Knee    Pain Orientation Right;Left    Pain Descriptors / Indicators Aching    Pain Type Chronic pain    Pain Onset More than a month ago             THEREX:  NU-STEP 2.33 min Level 3 No UE support  - C/o right knee pain, terminated activity   Treadmill 1.0 mph with BUE support  -5 min activity and 5 min break (distance 0.5 mi)    -Need break due to knee and hip pain  -5 min activity (3 on RPE scale)  without UE support   Total distance= 0.12 mi distance   Vitals 134/87 HR 96 SpO2 99% -Taken after pt reported feeling light headed after getting off treadmill           PT Education - 06/27/20 2132     Education Details form/technique with exercise    Person(s) Educated Patient    Methods Explanation;Verbal cues;Demonstration    Comprehension Verbalized understanding;Verbal cues required;Returned demonstration              PT Short Term Goals - 06/27/20 1552       PT SHORT TERM GOAL #1   Title Patient will show independence with HEP to improve cardiovascular ability and LE strength    Baseline 02/29/20- dependent, 04/25/20: still working on it; 5/18: Performs parts of HEP everyday.    Time 3    Period Weeks    Status On-going    Target Date 06/15/20               PT Long Term Goals - 06/27/20 1552  PT LONG TERM GOAL #1   Title Patient will improve by 333ft to show improved cardivascular ability    Baseline 02/29/20- 673 ft; 5/18: 960'.5"; 6/13: 838'    Time 4    Period Weeks    Status On-going      PT LONG TERM GOAL #2   Title Patient will improve performance in 30s sit to stand test to 15 reps show improved LE strength    Baseline 02/29/20- 9reps, 04/25/20: 11 reps; 5/18: 17 reps    Time 6    Period Weeks    Status Achieved      PT LONG TERM GOAL #3   Title Patient will be able to walk for 5 min without taking a break to show improved cardiavasuclar function between an RPE of 6-8/10    Baseline 02/29/20- 3 min, 04/25/20: 3 min; 05/18/20: 3 min, 15 sec.; 6/13: 3 min, 30 sec    Time 6    Period Weeks    Status On-going      PT LONG TERM GOAL #4   Title Pt will improve FOTO to target score to indicate functional mobility improvements.    Baseline 04/06/20: 45/58; 05/18/20: 56/58; 6/13: 58/58    Time 4    Period Weeks    Status Achieved      PT LONG TERM GOAL #5   Title Pt will be able to perform 10 m gait speed at 1.0 m/s to display improved  ability to perform community distances.    Baseline 6/13: 0.85 m/s    Time 6    Period Weeks    Status New                   Plan - 06/27/20 2128     Clinical Impression Statement Pt presents for follow-up for difficulty walking and multi-joint pain. She demonstrates improvement with walking tolerance with ability to complete two bouts of 5 minutes on treadmill. Pt's aerobic endurance is limited by joint pain, which resulted in her having to self-terminate one of her walking bouts, and makes it so she cannot utilize bicycles. She will continue to benefit from skilled PT to progress her lower extremity muscular endurance and decrease her lower extremity pain in order to be able to walk longer distance and stand for prolonged periods of time.    Personal Factors and Comorbidities Comorbidity 2    Comorbidities Obesity, Diabetes    Examination-Activity Limitations Squat;Stairs;Stand;Other;Transfers;Locomotion Level    Examination-Participation Restrictions Occupation;Other    Stability/Clinical Decision Making Evolving/Moderate complexity    Rehab Potential Good    PT Frequency 2x / week    PT Duration 8 weeks    PT Treatment/Interventions ADLs/Self Care Home Management;Cryotherapy;Electrical Stimulation;Moist Heat;Ultrasound;Gait training;Stair training;Functional mobility training;Therapeutic activities;Therapeutic exercise;Balance training;Neuromuscular re-education;Manual techniques;Passive range of motion;Energy conservation;Spinal Manipulations;Joint Manipulations    PT Next Visit Plan Observe pt engaging in aerobic exercise with use of RPE scale and progress strengthening exercises to include dynamic walking exercises that increase aerobic intensity.    PT Home Exercise Plan Access Code: [facility]    Consulted and Agree with Plan of Care Patient             Patient will benefit from skilled therapeutic intervention in order to improve the following deficits and impairments:   Abnormal gait, Cardiopulmonary status limiting activity, Decreased activity tolerance, Decreased balance, Decreased coordination, Decreased endurance, Decreased mobility, Decreased range of motion, Decreased strength, Difficulty walking, Hypomobility, Pain  Visit Diagnosis: Difficulty in walking, not  elsewhere classified  Muscle weakness (generalized)     Problem List Patient Active Problem List   Diagnosis Date Noted   COVID-19 long hauler manifesting chronic concentration deficit 04/05/2020   Hip pain 03/29/2020   Pulmonary embolism (HCC) 12/28/2019   Postural dizziness with presyncope 12/03/2019   History of COVID-19 11/03/2019   Shortness of breath 11/03/2019   Acute respiratory failure with hypoxia (HCC) 11/03/2019   Generalized anxiety disorder 02/23/2019   Sunburn 08/21/2018   Chronic pain of right knee 03/27/2017   Transient adjustment reaction with anxiety 05/02/2015   Edema 01/19/2013   OSA (obstructive sleep apnea) 09/15/2012   Obesity, morbid, BMI 50 or higher (HCC) 11/12/2011   Diabetes mellitus with renal complications (HCC) 07/08/2009   POLYCYSTIC OVARIES 07/08/2009   DEPRESSION, MAJOR, RECURRENT, MODERATE 07/08/2009   Ellin Goodie PT, DPT   06/27/2020, 9:36 PM  [place] [facility] [address], [place], [address] Phone: [phone]   Fax:  [phone]  Name: Kristy Conrad MRN: [id number] Date of Birth: 1975-04-24

## 2020-06-28 ENCOUNTER — Other Ambulatory Visit: Payer: Self-pay | Admitting: Family Medicine

## 2020-06-29 ENCOUNTER — Encounter: Payer: Self-pay | Admitting: Physical Therapy

## 2020-06-29 ENCOUNTER — Ambulatory Visit: Payer: BC Managed Care – PPO | Admitting: Physical Therapy

## 2020-06-29 DIAGNOSIS — M6281 Muscle weakness (generalized): Secondary | ICD-10-CM

## 2020-06-29 DIAGNOSIS — R262 Difficulty in walking, not elsewhere classified: Secondary | ICD-10-CM

## 2020-06-29 NOTE — Therapy (Signed)
[facility] [address], [place], [address] Phone: [phone]   Fax:  [phone]  Physical Therapy Treatment  Patient Details  Name: Kristy Conrad MRN: [id number] Date of Birth: 30-Jun-1975 No data recorded  Encounter Date: 06/29/2020    Past Medical History:  Diagnosis Date   Depression    Diabetes mellitus    Hyperlipidemia    Hypertension    Obesity    PCOS (polycystic ovarian syndrome)    Pulmonary embolism (HCC)     Past Surgical History:  Procedure Laterality Date   BREAST SURGERY     breast reduction   CHOLECYSTECTOMY     WISDOM TOOTH EXTRACTION      There were no vitals filed for this visit.   Subjective Assessment - 06/29/20 0855     Subjective Pt reports that her ankles continue to hurt especially her right ankle. She states that the exercises have been going alright, but it has been difficult to walk inside becaus of her cats.    Limitations Standing;Walking;House hold activities    How long can you sit comfortably? unlimited    How long can you stand comfortably? 10 min    How long can you walk comfortably? 3 min    Diagnostic tests CT scan, XRAY    Pain Score 1     Pain Location Ankle    Pain Orientation Right    Pain Descriptors / Indicators Aching    Pain Onset More than a month ago             THEREX:   Sit to Stand 3 x 10  -VC to slowly sit down   Walk 500 ft  -4 on RPE scale   Standing Calf Raises 2 x 15 with 1 UE support  -3 to 4 on RPE scale   Standing Marches 2 x 15 with 1 UE support  -3 to 4 on RPE scale   Standing Hip Abduction 2 x 15 with BUE support   Walk 500 ft  -4 on RPE scale                             PT Short Term Goals - 06/27/20 1552       PT SHORT TERM GOAL #1   Title Patient will show independence with HEP to improve cardiovascular ability and LE strength    Baseline 02/29/20- dependent, 04/25/20: still  working on it; 5/18: Performs parts of HEP everyday.    Time 3    Period Weeks    Status On-going    Target Date 06/15/20               PT Long Term Goals - 06/27/20 1552       PT LONG TERM GOAL #1   Title Patient will improve by 386ft to show improved cardivascular ability    Baseline 02/29/20- 673 ft; 5/18: 960'.5"; 6/13: 838'    Time 4    Period Weeks    Status On-going      PT LONG TERM GOAL #2   Title Patient will improve performance in 30s sit to stand test to 15 reps show improved LE strength    Baseline 02/29/20- 9reps, 04/25/20: 11 reps; 5/18: 17 reps    Time 6    Period Weeks    Status Achieved      PT LONG TERM GOAL #3  Title Patient will be able to walk for 5 min without taking a break to show improved cardiavasuclar function between an RPE of 6-8/10    Baseline 02/29/20- 3 min, 04/25/20: 3 min; 05/18/20: 3 min, 15 sec.; 6/13: 3 min, 30 sec    Time 6    Period Weeks    Status On-going      PT LONG TERM GOAL #4   Title Pt will improve FOTO to target score to indicate functional mobility improvements.    Baseline 04/06/20: 45/58; 05/18/20: 56/58; 6/13: 58/58    Time 4    Period Weeks    Status Achieved      PT LONG TERM GOAL #5   Title Pt will be able to perform 10 m gait speed at 1.0 m/s to display improved ability to perform community distances.    Baseline 6/13: 0.85 m/s    Time 6    Period Weeks    Status New                    Patient will benefit from skilled therapeutic intervention in order to improve the following deficits and impairments:     Visit Diagnosis: No diagnosis found.     Problem List Patient Active Problem List   Diagnosis Date Noted   COVID-19 long hauler manifesting chronic concentration deficit 04/05/2020   Hip pain 03/29/2020   Pulmonary embolism (HCC) 12/28/2019   Postural dizziness with presyncope 12/03/2019   History of COVID-19 11/03/2019   Shortness of breath 11/03/2019   Acute respiratory failure  with hypoxia (HCC) 11/03/2019   Generalized anxiety disorder 02/23/2019   Sunburn 08/21/2018   Chronic pain of right knee 03/27/2017   Transient adjustment reaction with anxiety 05/02/2015   Edema 01/19/2013   OSA (obstructive sleep apnea) 09/15/2012   Obesity, morbid, BMI 50 or higher (HCC) 11/12/2011   Diabetes mellitus with renal complications (HCC) 07/08/2009   POLYCYSTIC OVARIES 07/08/2009   DEPRESSION, MAJOR, RECURRENT, MODERATE 07/08/2009   Ellin Goodie PT, DPT  06/29/2020, 9:00 AM  [facility] [address] [place], [address] Phone: [phone]   Fax:  [phone]  Name: Kristy Conrad MRN: [id number] Date of Birth: July 01, 1975

## 2020-07-05 ENCOUNTER — Ambulatory Visit: Payer: BC Managed Care – PPO | Attending: Nurse Practitioner | Admitting: Physical Therapy

## 2020-07-05 DIAGNOSIS — R262 Difficulty in walking, not elsewhere classified: Secondary | ICD-10-CM

## 2020-07-05 DIAGNOSIS — M6281 Muscle weakness (generalized): Secondary | ICD-10-CM

## 2020-07-05 NOTE — Therapy (Signed)
[facility] [address], [place], [address] Phone: [phone]   Fax:  [phone]  Physical Therapy Treatment  Patient Details  Name: Kristy Conrad MRN: [id number] Date of Birth: 1975/03/02 No data recorded  Encounter Date: 07/05/2020   PT End of Session - 07/05/20 1744     Visit Number 25    Number of Visits 32    Date for PT Re-Evaluation 07/25/20    PT Start Time 1545    PT Stop Time 1630    PT Time Calculation (min) 45 min    Activity Tolerance Patient tolerated treatment well;Patient limited by pain    Behavior During Therapy WFL for tasks assessed/performed             Past Medical History:  Diagnosis Date   Depression    Diabetes mellitus    Hyperlipidemia    Hypertension    Obesity    PCOS (polycystic ovarian syndrome)    Pulmonary embolism (HCC)     Past Surgical History:  Procedure Laterality Date   BREAST SURGERY     breast reduction   CHOLECYSTECTOMY     WISDOM TOOTH EXTRACTION      There were no vitals filed for this visit.   Subjective Assessment - 07/05/20 1552     Subjective Pt reports that exercises are going well and she is experiencing about the same amount of pain.    Limitations Standing;Walking;House hold activities    How long can you sit comfortably? unlimited    How long can you stand comfortably? 10 min    How long can you walk comfortably? 3 min    Diagnostic tests CT scan, XRAY    Currently in Pain? Yes    Pain Score 1     Pain Location Ankle    Pain Orientation Right    Pain Descriptors / Indicators Aching    Pain Type Chronic pain    Pain Onset More than a month ago             THEREX:   Sit to Stand 3 x 10 -VC to slowly sit down   Ambulate 600 ft  -4 on RPE scale -Pt reports increased pain in right achilles tendon  Standing Marches 3 x 10  -4 on RPE scale  -Vcs to decrease speed of exercise     Ambulate 600 ft  -4 on RPE  scale     Standing Hip Abduction 2 x 10 -Vcs to maintain up right posture       Updated HEP and educated patient on changes to exercises to reduce use of upper extremity support.        PT Education - 07/05/20 1600     Education Details form/technique with exercise    Person(s) Educated Patient    Methods Explanation;Demonstration;Verbal cues    Comprehension Verbalized understanding;Returned demonstration              PT Short Term Goals - 07/05/20 1559       PT SHORT TERM GOAL #1   Title Patient will show independence with HEP to improve cardiovascular ability and LE strength    Baseline 02/29/20- dependent, 04/25/20: still working on it; 5/18: Performs parts of HEP everyday.    Time 3    Period Weeks    Status On-going    Target Date 06/15/20               PT  Long Term Goals - 07/05/20 1559       PT LONG TERM GOAL #1   Title Patient will improve by 374ft to show improved cardivascular ability    Baseline 02/29/20- 673 ft; 5/18: 960'.5"; 6/13: 838'    Time 4    Period Weeks    Status On-going      PT LONG TERM GOAL #2   Title Patient will improve performance in 30s sit to stand test to 15 reps show improved LE strength    Baseline 02/29/20- 9reps, 04/25/20: 11 reps; 5/18: 17 reps    Time 6    Period Weeks    Status Achieved      PT LONG TERM GOAL #3   Title Patient will be able to walk for 5 min without taking a break to show improved cardiavasuclar function between an RPE of 6-8/10    Baseline 02/29/20- 3 min, 04/25/20: 3 min; 05/18/20: 3 min, 15 sec.; 6/13: 3 min, 30 sec    Time 6    Period Weeks    Status On-going      PT LONG TERM GOAL #4   Title Pt will improve FOTO to target score to indicate functional mobility improvements.    Baseline 04/06/20: 45/58; 05/18/20: 56/58; 6/13: 58/58    Time 4    Period Weeks    Status Achieved      PT LONG TERM GOAL #5   Title Pt will be able to perform 10 m gait speed at 1.0 m/s to display improved  ability to perform community distances.    Baseline 6/13: 0.85 m/s    Time 6    Period Weeks    Status New                   Plan - 07/05/20 1747     Clinical Impression Statement Pt presents for follow-up for difficulty walking and generalized weakness. She continues to demonstrate improvement in muscular strength and endurance with ability to perform standing hip strengthening exercises with decreased upper extremity support and with the completion of longer bouts of ambulation at a consistent moderate intensity. She was limited during session by right ankle pain that did resolved after performing calf stretch. 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    Personal Factors and Comorbidities Comorbidity 2    Comorbidities Obesity, Diabetes    Examination-Activity Limitations Squat;Stairs;Stand;Other;Transfers;Locomotion Level    Examination-Participation Restrictions Occupation;Other    Stability/Clinical Decision Making Evolving/Moderate complexity    Rehab Potential Good    PT Frequency 2x / week    PT Duration 8 weeks    PT Treatment/Interventions ADLs/Self Care Home Management;Cryotherapy;Electrical Stimulation;Moist Heat;Ultrasound;Gait training;Stair training;Functional mobility training;Therapeutic activities;Therapeutic exercise;Balance training;Neuromuscular re-education;Manual techniques;Passive range of motion;Energy conservation;Spinal Manipulations;Joint Manipulations    PT Next Visit Plan Circuit training: Walk 7 labs followed by standing hip strengthening exercise followed by walking. Consider ankle weights for progressing strengthening exercises    PT Home Exercise Plan Access Code: [facility]    Consulted and Agree with Plan of Care Patient            HEP includes the following:  Access Code: [facility] URL:  [email or web address] Date: 07/05/2020 Prepared by: Ellin Goodie  Exercises Standing March with Counter Support - 1 x daily - 3 x weekly - 3 sets - 10 reps Heel rises  with counter support - 1 x daily - 3 x weekly - 3 sets - 10 reps Standing Alternating Knee Flexion - 1 x daily - 3 x weekly - 3 sets - 10 reps Sit to Stand - 1 x daily - 3 x weekly - 3 sets - 10 reps Standing Hip Abduction with Counter Support - 1 x daily - 3 x weekly - 3 sets - 10 reps Single Leg Stance - 1 x daily - 3 x weekly - 1 sets - 5 reps - 30 hold    Patient will benefit from skilled therapeutic intervention in order to improve the following deficits and impairments:  Abnormal gait, Cardiopulmonary status limiting activity, Decreased activity tolerance, Decreased balance, Decreased coordination, Decreased endurance, Decreased mobility, Decreased range of motion, Decreased strength, Difficulty walking, Hypomobility, Pain  Visit Diagnosis: Difficulty in walking, not elsewhere classified  Muscle weakness (generalized)     Problem List Patient Active Problem List   Diagnosis Date Noted   COVID-19 long hauler manifesting chronic concentration deficit 04/05/2020   Hip pain 03/29/2020   Pulmonary embolism (HCC) 12/28/2019   Postural dizziness with presyncope 12/03/2019   History of COVID-19 11/03/2019   Shortness of breath 11/03/2019   Acute respiratory failure with hypoxia (HCC) 11/03/2019   Generalized anxiety disorder 02/23/2019   Sunburn 08/21/2018   Chronic pain of right knee 03/27/2017   Transient adjustment reaction with anxiety 05/02/2015   Edema 01/19/2013   OSA (obstructive sleep apnea) 09/15/2012   Obesity, morbid, BMI 50 or higher (HCC) 11/12/2011   Diabetes mellitus with renal complications (HCC) 07/08/2009   POLYCYSTIC OVARIES 07/08/2009   DEPRESSION, MAJOR, RECURRENT, MODERATE 07/08/2009   Ellin Goodie PT, DPT   07/05/2020, 5:50 PM  [facility] [address] [place], [address] Phone: [phone]   Fax:  [phone]  Name: Kristy Conrad MRN: [id number] Date of Birth: 1975-02-16

## 2020-07-07 ENCOUNTER — Ambulatory Visit: Payer: BC Managed Care – PPO | Admitting: Physical Therapy

## 2020-07-07 DIAGNOSIS — R262 Difficulty in walking, not elsewhere classified: Secondary | ICD-10-CM | POA: Diagnosis not present

## 2020-07-07 DIAGNOSIS — M6281 Muscle weakness (generalized): Secondary | ICD-10-CM

## 2020-07-07 NOTE — Therapy (Signed)
[facility] [address], [place], [address] Phone: [phone]   Fax:  [phone]  Physical Therapy Treatment  Patient Details  Name: Kristy Conrad MRN: [id number] Date of Birth: 01-02-1975 No data recorded  Encounter Date: 07/07/2020   PT End of Session - 07/07/20 1837     Visit Number 26    Number of Visits 32    Date for PT Re-Evaluation 07/25/20    PT Start Time 1545    PT Stop Time 1615    PT Time Calculation (min) 30 min    Activity Tolerance Patient tolerated treatment well;Patient limited by pain    Behavior During Therapy WFL for tasks assessed/performed             Past Medical History:  Diagnosis Date   Depression    Diabetes mellitus    Hyperlipidemia    Hypertension    Obesity    PCOS (polycystic ovarian syndrome)    Pulmonary embolism (HCC)     Past Surgical History:  Procedure Laterality Date   BREAST SURGERY     breast reduction   CHOLECYSTECTOMY     WISDOM TOOTH EXTRACTION      There were no vitals filed for this visit.   Subjective Assessment - 07/07/20 1557     Subjective Pt reports not doing exercises because she needed a break.    Limitations Standing;Walking;House hold activities    How long can you sit comfortably? unlimited    How long can you stand comfortably? 10 min    How long can you walk comfortably? 3 min    Diagnostic tests CT scan, XRAY    Currently in Pain? Yes    Pain Score 1     Pain Location Ankle    Pain Orientation Right    Pain Descriptors / Indicators Aching    Pain Onset More than a month ago            MANUAL: Right Ankle  Talocrural Joint Mobs : -Anterior Talar Glide 3 x 10  -Posterior talar glide    Subtalar Joint Mobs: -Lateral glides 3 x 10  -Medial glides 3 x 10    THEREX:  TM 1.5 mph 7 min total  -Report of 4 on RPE at minute 3  -163 HR at 5 min  -Report of increased right ankle and knee pain.  -ER of right  foot for increased clearance    Eccentric Heel Raise-Double Up and Single Down on Right Foot  -1 x 10    PT Education - 07/07/20 1838     Education Details form/technique with exercise    Person(s) Educated Patient    Methods Verbal cues    Comprehension Verbalized understanding;Returned demonstration              PT Short Term Goals - 07/07/20 1827       PT SHORT TERM GOAL #1   Title Patient will show independence with HEP to improve cardiovascular ability and LE strength    Baseline 02/29/20- dependent, 04/25/20: still working on it; 5/18: Performs parts of HEP everyday.    Time 3    Period Weeks    Status On-going    Target Date 06/15/20               PT Long Term Goals - 07/07/20 1827       PT LONG TERM GOAL #1   Title Patient will improve  by 378ft to show improved cardivascular ability    Baseline 02/29/20- 673 ft; 5/18: 960'.5"; 6/13: 838'    Time 4    Period Weeks    Status On-going      PT LONG TERM GOAL #2   Title Patient will improve performance in 30s sit to stand test to 15 reps show improved LE strength    Baseline 02/29/20- 9reps, 04/25/20: 11 reps; 5/18: 17 reps    Time 6    Period Weeks    Status Achieved      PT LONG TERM GOAL #3   Title Patient will be able to walk for 5 min without taking a break to show improved cardiavasuclar function between an RPE of 6-8/10    Baseline 02/29/20- 3 min, 04/25/20: 3 min; 05/18/20: 3 min, 15 sec.; 6/13: 3 min, 30 sec    Time 6    Period Weeks    Status On-going      PT LONG TERM GOAL #4   Title Pt will improve FOTO to target score to indicate functional mobility improvements.    Baseline 04/06/20: 45/58; 05/18/20: 56/58; 6/13: 58/58    Time 4    Period Weeks    Status Achieved      PT LONG TERM GOAL #5   Title Pt will be able to perform 10 m gait speed at 1.0 m/s to display improved ability to perform community distances.    Baseline 6/13: 0.85 m/s    Time 6    Period Weeks    Status New             HEP includes following exercises:  Access Code: [facility] URL: [email or web address] Date: 07/07/2020 Prepared by: Ellin Goodie  Exercises Standing March with Counter Support - 1 x daily - 3 x weekly - 3 sets - 10 reps  Standing Alternating Knee Flexion - 1 x daily - 3 x weekly - 3 sets - 10 reps Sit to Stand - 1 x daily - 3 x weekly - 3 sets - 10 reps Standing Hip Abduction with Counter Support - 1 x daily - 3 x weekly - 3 sets - 10 reps Single Leg Stance - 1 x daily - 3 x weekly - 1 sets - 5 reps - 30 hold Standing Eccentric Heel Raise - 1 x daily - 3 x weekly - 2 sets - 10 reps      Plan - 07/07/20 1831     Clinical Impression Statement Pt presents for f/u for difficulty walking and generalized weakness. Pt demonstrates improved aerobic capacity with ability to ambulate on treadmill for 6 min reaching vigorous intensity for 2 min. She exhibits ER of right foot during swing phase, and she will likely benefit from ankle mobilizations to increase ROM. Next session goals will be reassessed for monthly progress note. Pt will continue to benefit from skilled PT to progress aerobic endurance and LE strength to be able to perform community ambulation without experiencing pain.    Personal Factors and Comorbidities Comorbidity 2    Comorbidities Obesity, Diabetes    Examination-Activity Limitations Squat;Stairs;Stand;Other;Transfers;Locomotion Level    Examination-Participation Restrictions Occupation;Other    Stability/Clinical Decision Making Evolving/Moderate complexity    Rehab Potential Good    PT Frequency 2x / week    PT Duration 8 weeks    PT Treatment/Interventions ADLs/Self Care Home Management;Cryotherapy;Electrical Stimulation;Moist Heat;Ultrasound;Gait training;Stair training;Functional mobility training;Therapeutic activities;Therapeutic exercise;Balance training;Neuromuscular re-education;Manual techniques;Passive range of motion;Energy  conservation;Spinal Manipulations;Joint Manipulations    PT Next  Visit Plan Reassess goal. Circuit training: Walk 7 labs followed by standing hip strengthening exercise followed by walking. Consider ankle weights for progressing strengthening exercises    PT Home Exercise Plan Access Code: [facility]    Consulted and Agree with Plan of Care Patient             Patient will benefit from skilled therapeutic intervention in order to improve the following deficits and impairments:  Abnormal gait, Cardiopulmonary status limiting activity, Decreased activity tolerance, Decreased balance, Decreased coordination, Decreased endurance, Decreased mobility, Decreased range of motion, Decreased strength, Difficulty walking, Hypomobility, Pain  Visit Diagnosis: Difficulty in walking, not elsewhere classified  Muscle weakness (generalized)     Problem List Patient Active Problem List   Diagnosis Date Noted   COVID-19 long hauler manifesting chronic concentration deficit 04/05/2020   Hip pain 03/29/2020   Pulmonary embolism (HCC) 12/28/2019   Postural dizziness with presyncope 12/03/2019   History of COVID-19 11/03/2019   Shortness of breath 11/03/2019   Acute respiratory failure with hypoxia (HCC) 11/03/2019   Generalized anxiety disorder 02/23/2019   Sunburn 08/21/2018   Chronic pain of right knee 03/27/2017   Transient adjustment reaction with anxiety 05/02/2015   Edema 01/19/2013   OSA (obstructive sleep apnea) 09/15/2012   Obesity, morbid, BMI 50 or higher (HCC) 11/12/2011   Diabetes mellitus with renal complications (HCC) 07/08/2009   POLYCYSTIC OVARIES 07/08/2009   DEPRESSION, MAJOR, RECURRENT, MODERATE 07/08/2009   Ellin Goodie PT, DPT  07/07/2020, 6:38 PM  [facility] [address] [place], [address] Phone: [phone]   Fax:  [phone]  Name: Kristy Conrad MRN: [id number] Date of Birth:  08-05-1975

## 2020-07-10 NOTE — Progress Notes (Signed)
Cardiology Office Note:   Date:  07/12/2020  NAME:  Kristy Conrad    MRN: [id number] DOB:  1975/05/09   PCP:  Lynnda Child, MD  Cardiologist:  None  Electrophysiologist:  None   Referring MD: Lynnda Child, MD   Chief Complaint  Patient presents with   Follow-up   History of Present Illness:   Kristy Conrad is a 45 y.o. female with a hx of morbid obesity, DM, HLD, PE, OSA who presents for follow-up. Diagnosed with PE 12/2019. This was after covid. We planned for 3 months of therapy.  She has been on Eliquis since April 1.  Doing well.  No bleeding issues.  Weights are down 5 to 6 pounds.  She is working with nutritionist.  Still considering weight loss surgery.  We will start her on Crestor 5 mg daily.  Tolerating this well.  No cramping.  She does have some venous insufficiency.  She is wearing compression stockings.  Overall doing well.  BP 122/80.  Denies any chest pain or trouble breathing.  She is working with physical therapy.  She likely has a tendinopathy of her foot.  Overall seems to be doing well.  Problem List 1. Obesity -347 lbs -BMI 59 2. DM -A1c 8.6 -coronary calcium score = 0  3. HLD -T chol 128, HDL 38, LDL 70, TG 108 4. OSA 5. PE -12/11/2019 -10/13/2019 -> Covid 19  Past Medical History: Past Medical History:  Diagnosis Date   Depression    Diabetes mellitus    Hyperlipidemia    Hypertension    Obesity    PCOS (polycystic ovarian syndrome)    Pulmonary embolism (HCC)     Past Surgical History: Past Surgical History:  Procedure Laterality Date   BREAST SURGERY     breast reduction   CHOLECYSTECTOMY     WISDOM TOOTH EXTRACTION      Current Medications: Current Meds  Medication Sig   albuterol (VENTOLIN HFA) 108 (90 Base) MCG/ACT inhaler TAKE 2 PUFFS BY MOUTH EVERY 6 HOURS AS NEEDED FOR WHEEZE OR SHORTNESS OF BREATH   cetirizine (ZYRTEC) 10 MG tablet Take 10 mg by mouth daily.   Cholecalciferol (VITAMIN D3) 50 MCG (2000 UT) TABS Take  50 mcg by mouth daily.   COLLAGEN PO Take by mouth.   diclofenac (VOLTAREN) 75 MG EC tablet TAKE 1 TABLET BY MOUTH TWICE A DAY   FLUoxetine (PROZAC) 20 MG capsule Take 1 capsule (20 mg total) by mouth daily. Appt with PCP needed for further refills.   fluticasone furoate-vilanterol (BREO ELLIPTA) 200-25 MCG/INH AEPB Inhale 1 puff into the lungs daily.   folic acid (FOLVITE) 1 MG tablet Take 1 mg by mouth daily.   glipiZIDE (GLUCOTROL) 5 MG tablet TAKE 1 TABLET (5 MG TOTAL) BY MOUTH 2 (TWO) TIMES DAILY BEFORE A MEAL.   Glucosamine HCl-MSM (GLUCOSAMINE-MSM PO) Take 1,500 mg by mouth daily.   lisinopril (ZESTRIL) 5 MG tablet TAKE 1 TABLET BY MOUTH EVERY DAY   metFORMIN (GLUCOPHAGE) 1000 MG tablet TAKE 1 TABLET BY MOUTH TWICE A DAY. Needs follow-up appt with PCP for further refills.   rosuvastatin (CRESTOR) 10 MG tablet Take 10 mg by mouth daily.   Turmeric Curcumin 500 MG CAPS Take 3 capsules by mouth daily.    [DISCONTINUED] apixaban (ELIQUIS) 5 MG TABS tablet Take 1 tablet (5 mg total) by mouth 2 (two) times daily.     Allergies:    Morphine   Social History: Social History  Socioeconomic History   Marital status: Single    Spouse name: Not on file   Number of children: 0   Years of education: masters in education   Highest education level: Not on file  Occupational History   Occupation: Magazine features editor: [employer]  Tobacco Use   Smoking status: Never   Smokeless tobacco: Never  Substance and Sexual Activity   Alcohol use: Yes    Comment: 1-2 times per month   Drug use: No   Sexual activity: Not Currently    Partners: Male  Other Topics Concern   Not on file  Social History Narrative   Regular exercise: no   Caffeine use: daily, tea   06/16/19   From: [place]   Living: alone, 2 kittens   Work: [employer] education - but recently lost her job      Family: cousins and aunts/uncles nearby - OK relationship, has a good friend support system      Enjoys:  spending time with friends - outside, cats time - used to be a coupon      Exercise: water aerobics currently - 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   Diet: not good, does not follow diabetic diet      Safety   Seat belts: Yes    Guns: No   Safe in relationships: Yes    Social Determinants of Corporate investment banker Strain: Not on file  Food Insecurity: Not on file  Transportation Needs: Not on file  Physical Activity: Not on file  Stress: Not on file  Social Connections: Not on file     Family History: The patient's family history includes Cancer in her paternal grandfather; Cancer (age of onset: 2) in her mother; Emphysema in her maternal grandfather and paternal grandfather; Heart attack (age of onset: 34) in her father; Heart disease in her maternal grandmother; Lung cancer in her maternal grandfather.  ROS:   All other ROS reviewed and negative. Pertinent positives noted in the HPI.     EKGs/Labs/Other Studies Reviewed:   The following studies were personally reviewed by me today:  TTE 12/16/2019  1. Left ventricular ejection fraction, by estimation, is 55 to 60%. The  left ventricle has normal function. The left ventricle has no regional  wall motion abnormalities. Left ventricular diastolic parameters were  normal.   2. Right ventricular systolic function is normal. The right ventricular  size is normal.   3. Left atrial size was moderately dilated.   4. The mitral valve is normal in structure. No evidence of mitral valve  regurgitation. No evidence of mitral stenosis.   5. The aortic valve is normal in structure. Aortic valve regurgitation is  not visualized. No aortic stenosis is present.   6. Aortic dilatation noted. There is mild dilatation at the level of the  sinuses of Valsalva, measuring 40 mm.   7. The inferior vena cava is normal in size with greater than 50%  respiratory variability, suggesting right atrial pressure of 3 mmHg.    Recent Labs: 12/11/2019: BUN 11; Creatinine, Ser 0.59; NT-Pro BNP 11; Potassium 4.7; Sodium 134; TSH 1.210   Recent Lipid Panel    Component Value Date/Time   CHOL 128 01/06/2019 1031   TRIG 108 01/06/2019 1031   HDL 38 (L) 01/06/2019 1031   CHOLHDL 3.4 01/06/2019 1031   CHOLHDL 3 09/03/2017 1304   VLDL 20.6 09/03/2017 1304   LDLCALC 70 01/06/2019 1031  Physical Exam:   VS:  BP 122/80   Pulse 80   Ht 5\' 4"  (1.626 m)   Wt (!) 343 lb (155.6 kg)   SpO2 97%   BMI 58.88 kg/m    Wt Readings from Last 3 Encounters:  07/12/20 (!) 343 lb (155.6 kg)  05/05/20 (!) 348 lb 12 oz (158.2 kg)  04/05/20 (!) 346 lb (156.9 kg)    General: Well nourished, well developed, in no acute distress Head: Atraumatic, normal size  Eyes: PEERLA, EOMI  Neck: Supple, no JVD Endocrine: No thryomegaly Cardiac: Normal S1, S2; RRR; no murmurs, rubs, or gallops Lungs: Clear to auscultation bilaterally, no wheezing, rhonchi or rales  Abd: Soft, nontender, no hepatomegaly  Ext: No edema, pulses 2+ Musculoskeletal: No deformities, BUE and BLE strength normal and equal Skin: Warm and dry, no rashes   Neuro: Alert and oriented to person, place, time, and situation, CNII-XII grossly intact, no focal deficits  Psych: Normal mood and affect   ASSESSMENT:   Kristy Conrad is a 45 y.o. female who presents for the following: 1. Other pulmonary embolism without acute cor pulmonale, unspecified chronicity (HCC)   2. SOB (shortness of breath) on exertion   3. Mixed hyperlipidemia   4. Obesity, morbid, BMI 50 or higher (HCC)   5. OSA (obstructive sleep apnea)     PLAN:   1. Other pulmonary embolism without acute cor pulmonale, unspecified chronicity (HCC) 2. SOB (shortness of breath) on exertion -Diagnosed with COVID in October 2021.  Had worsening shortness of breath and found to have a PE in December 2021.  She did not complete therapy.  When I saw her in March we restarted Eliquis.  She has completed 3  months of Eliquis.  I think this is reasonable to stop given the fact this was a provoked and small PE.  She is okay to do this.  Her echocardiogram showed normal LV and RV function.  There is no detriment to her heart.  She has had resolution of her shortness of breath.  Her symptoms are likely all COVID-related.  No evidence of congestive heart failure.  Moving forward she will see April as needed.  3. Mixed hyperlipidemia -Continue Crestor daily as she has diabetic.  4. Obesity, morbid, BMI 50 or higher (HCC) -Diet and exercise recommended.  5. OSA (obstructive sleep apnea) -CPAP recommended.  Disposition: Return if symptoms worsen or fail to improve.  Medication Adjustments/Labs and Tests Ordered: Current medicines are reviewed at length with the patient today.  Concerns regarding medicines are outlined above.  No orders of the defined types were placed in this encounter.  No orders of the defined types were placed in this encounter.   Patient Instructions  Medication Instructions:  Stop Eliquis when you are finished.   *If you need a refill on your cardiac medications before your next appointment, please call your pharmacy*  Follow-Up: At [facility], you and your health needs are our priority.  As part of our continuing mission to provide you with exceptional heart care, we have created designated Provider Care Teams.  These Care Teams include your primary Cardiologist (physician) and Advanced Practice Providers (APPs -  Physician Assistants and Nurse Practitioners) who all work together to provide you with the care you need, when you need it.  We recommend signing up for the patient portal called "MyChart".  Sign up information is provided on this After Visit Summary.  MyChart is used to connect with patients for Virtual Visits (  Telemedicine).  Patients are able to view lab/test results, encounter notes, upcoming appointments, etc.  Non-urgent messages can be sent to your provider  as well.   To learn more about what you can do with MyChart, go to [email or web address].    Your next appointment:   As needed  The format for your next appointment:   In Person  Provider:   Lennie OdorWesley O'Neal, MD    Time Spent with Patient: I have spent a total of 25 minutes with patient reviewing hospital notes, telemetry, EKGs, labs and examining the patient as well as establishing an assessment and plan that was discussed with the patient.  > 50% of time was spent in direct patient care.  Signed, Lenna GilfordWesley T. Flora Lipps'Neal, MD, [facility] [place]  [facility]  [address], [address] [place], [place] [address] [phone]  07/12/2020 1:31 PM

## 2020-07-11 ENCOUNTER — Ambulatory Visit: Payer: BC Managed Care – PPO | Admitting: Physical Therapy

## 2020-07-11 ENCOUNTER — Other Ambulatory Visit: Payer: Self-pay

## 2020-07-11 DIAGNOSIS — M6281 Muscle weakness (generalized): Secondary | ICD-10-CM

## 2020-07-11 DIAGNOSIS — R262 Difficulty in walking, not elsewhere classified: Secondary | ICD-10-CM | POA: Diagnosis not present

## 2020-07-11 NOTE — Therapy (Signed)
[facility] [address] [place], [address] Phone: [phone]   Fax:  [phone]  Physical Therapy Treatment  Patient Details  Name: Kristy Conrad MRN: [id number] Date of Birth: 01/27/1975 No data recorded  Encounter Date: 07/11/2020   PT End of Session - 07/11/20 0818     Visit Number 27    Number of Visits 32    Date for PT Re-Evaluation 07/25/20    PT Start Time 0800    PT Stop Time 0845    PT Time Calculation (min) 45 min    Activity Tolerance Patient tolerated treatment well;Patient limited by pain    Behavior During Therapy [facility] for tasks assessed/performed             Past Medical History:  Diagnosis Date   Depression    Diabetes mellitus    Hyperlipidemia    Hypertension    Obesity    PCOS (polycystic ovarian syndrome)    Pulmonary embolism (HCC)     Past Surgical History:  Procedure Laterality Date   BREAST SURGERY     breast reduction   CHOLECYSTECTOMY     WISDOM TOOTH EXTRACTION      There were no vitals filed for this visit.   Subjective Assessment - 07/11/20 0804     Subjective Pt states that her left ankle is feeling very stiff and she is frustrated, because she does not know why.    Limitations Standing;Walking;House hold activities    How long can you sit comfortably? unlimited    How long can you stand comfortably? 10 min    How long can you walk comfortably? 3 min    Diagnostic tests CT scan, XRAY    Currently in Pain? Yes    Pain Score 1     Pain Location Ankle    Pain Orientation Right    Pain Descriptors / Indicators Aching    Pain Type Chronic pain    Pain Onset More than a month ago            MANUAL:  Posterior Glide grade III 3 x 10  Hamstring Stretch on RUE 2 x 30 sec  Gentle PROM knee flex and extension 1 x 10  Dorsal Glide of Tibia on Femur grade III 2 x 10  (Need to do ventral glide next time)   THEREX:  Lower Trunk Rotation 1 x 10   -Vcs to hold for 2 to 3 sec    PHYSICAL PERFORMANCE:  6 mWT= 1,012 with 1 standing rest break  -3 to 4 out of 10 for ankle pain  -RPE 6 to 7   10 meter Gait Speed: 1st trial 7.94 sec , 2nd trial 8.28, Average= 8.11 -Gait speed time= 1.23 m/sec  -> 1 m/sec [employer] and cross street safely and normal WS   PT Education - 07/11/20 0828     Education Details form/technique with exercise    Person(s) Educated Patient    Methods Verbal cues    Comprehension Verbalized understanding;Returned demonstration              PT Short Term Goals - 07/11/20 1049       PT SHORT TERM GOAL #1   Title Patient will show independence with HEP to improve cardiovascular ability and LE strength    Baseline 02/29/20- dependent, 04/25/20: still working on it; 5/18: Performs parts of HEP everyday. 7/11: Consistently performs HEP    Time 3  Period Weeks    Status Achieved               PT Long Term Goals - 07/11/20 9528       PT LONG TERM GOAL #1   Title Patient will improve by 322ft to show improved cardivascular ability    Baseline 02/29/20- 673 ft; 5/18: 960'.5"; 6/13: 838' 7/11: 1012'    Time 4    Period Weeks    Status Achieved      PT LONG TERM GOAL #2   Title Patient will improve performance in 30s sit to stand test to 15 reps show improved LE strength    Baseline 02/29/20- 9reps, 04/25/20: 11 reps; 5/18: 17 reps    Time 6    Period Weeks    Status Achieved      PT LONG TERM GOAL #3   Title Patient will be able to walk for 5 min without taking a break to show improved cardiavasuclar function between an RPE of 6-8/10    Baseline 02/29/20- 3 min, 04/25/20: 3 min; 05/18/20: 3 min, 15 sec.; 6/13: 3 min, 30 sec 7/11: 5 min , 15 sec 1 standing rest break    Time 6    Period Weeks    Status Achieved    Target Date 07/25/20      PT LONG TERM GOAL #4   Title Pt will improve FOTO to target score to indicate functional mobility improvements.    Baseline 04/06/20: 45/58;  05/18/20: 56/58; 6/13: 58/58    Time 4    Period Weeks    Status Achieved      PT LONG TERM GOAL #5   Title Pt will be able to perform 10 m gait speed at 1.0 m/s to display improved ability to perform community distances.    Baseline 6/13: 0.85 m/s    Time 6    Period Weeks    Status New                Plan - 07/11/20 1044     Clinical Impression Statement Pt presents for f/u and re-evaluation of goals for monthly progress note. Pt shows increased aerobic endurance and increased ambulatory ability with increase in total distance walked for and improved gait speed. Despite significant improvements, she exhibits increased pain in her right ankle during walking tasks that worsens with increased LE activity. 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    Personal Factors and Comorbidities Comorbidity 2    Comorbidities Obesity, Diabetes    Examination-Activity Limitations Squat;Stairs;Stand;Other;Transfers;Locomotion Level    Examination-Participation Restrictions Occupation;Other    Stability/Clinical Decision Making Evolving/Moderate complexity    Rehab Potential Good    PT Frequency 2x / week    PT Duration 8 weeks    PT Treatment/Interventions ADLs/Self Care Home Management;Cryotherapy;Electrical Stimulation;Moist Heat;Ultrasound;Gait training;Stair training;Functional mobility training;Therapeutic activities;Therapeutic exercise;Balance training;Neuromuscular re-education;Manual techniques;Passive range of motion;Energy conservation;Spinal Manipulations;Joint Manipulations    PT Next Visit Plan Take FOTO. Ankle mobilizations, progress LE strengthening  and treadmill aerobic training via circuit training.    PT Home Exercise Plan Access Code: [facility]    Consulted and Agree with Plan of Care Patient  Home Exercise Plan includes following:  Access Code: [facility] URL: [email or web address] Date: 07/11/2020 Prepared by: Ellin Goodie  Exercises Standing March with Counter Support - 1 x daily - 3 x weekly - 3 sets - 10 reps Standing Alternating Knee Flexion - 1 x daily - 3 x weekly - 3 sets - 10 reps Sit to Stand - 1 x daily - 3 x weekly - 3 sets - 10 reps Standing Hip Abduction with Counter Support - 1 x daily - 3 x weekly - 3 sets - 10 reps Single Leg Stance - 1 x daily - 3 x weekly - 1 sets - 5 reps - 30 hold Standing Eccentric Heel Raise - 1 x daily - 3 x weekly - 2 sets - 10 reps Supine Lower Trunk Rotation - 1 x daily - 7 x weekly - 3 sets - 10 reps - 2 hold   Patient will benefit from skilled therapeutic intervention in order to improve the following deficits and impairments:  Abnormal gait, Cardiopulmonary status limiting activity, Decreased activity tolerance, Decreased balance, Decreased coordination, Decreased endurance, Decreased mobility, Decreased range of motion, Decreased strength, Difficulty walking, Hypomobility, Pain  Visit Diagnosis: Difficulty in walking, not elsewhere classified  Muscle weakness (generalized)     Problem List Patient Active Problem List   Diagnosis Date Noted   COVID-19 long hauler manifesting chronic concentration deficit 04/05/2020   Hip pain 03/29/2020   Pulmonary embolism (HCC) 12/28/2019   Postural dizziness with presyncope 12/03/2019   History of COVID-19 11/03/2019   Shortness of breath 11/03/2019   Acute respiratory failure with hypoxia (HCC) 11/03/2019   Generalized anxiety disorder 02/23/2019   Sunburn 08/21/2018   Chronic pain of right knee 03/27/2017   Transient adjustment reaction with anxiety 05/02/2015   Edema 01/19/2013   OSA (obstructive  sleep apnea) 09/15/2012   Obesity, morbid, BMI 50 or higher (HCC) 11/12/2011   Diabetes mellitus with renal complications (HCC) 07/08/2009   POLYCYSTIC OVARIES 07/08/2009   DEPRESSION, MAJOR, RECURRENT, MODERATE 07/08/2009   Ellin Goodie PT, DPT  07/11/2020, 11:17 AM  [facility] [address], [place], [address] Phone: [phone]   Fax:  [phone]  Name: Kristy Conrad MRN: [id number] Date of Birth: [birth year]

## 2020-07-12 ENCOUNTER — Encounter: Payer: Self-pay | Admitting: Dietician

## 2020-07-12 ENCOUNTER — Encounter: Payer: BC Managed Care – PPO | Attending: Family Medicine | Admitting: Dietician

## 2020-07-12 ENCOUNTER — Encounter: Payer: BC Managed Care – PPO | Admitting: Physical Therapy

## 2020-07-12 ENCOUNTER — Ambulatory Visit: Payer: BC Managed Care – PPO | Admitting: Cardiovascular Disease

## 2020-07-12 ENCOUNTER — Encounter: Payer: Self-pay | Admitting: Cardiovascular Disease

## 2020-07-12 VITALS — BP 122/80 | HR 80 | Ht 64.0 in | Wt 343.0 lb

## 2020-07-12 DIAGNOSIS — R0602 Shortness of breath: Secondary | ICD-10-CM

## 2020-07-12 DIAGNOSIS — I2699 Other pulmonary embolism without acute cor pulmonale: Secondary | ICD-10-CM | POA: Diagnosis not present

## 2020-07-12 DIAGNOSIS — E782 Mixed hyperlipidemia: Secondary | ICD-10-CM

## 2020-07-12 DIAGNOSIS — G4733 Obstructive sleep apnea (adult) (pediatric): Secondary | ICD-10-CM

## 2020-07-12 NOTE — Progress Notes (Signed)
Medical Nutrition Therapy  Appointment Start time:  1150  Appointment End time:  1220  Primary concerns today: Improving Health Referral diagnosis: E66.01 Obesity, morbid, BMI 50 or higher Preferred learning style: Visual Learning readiness:  Contemplating   NUTRITION ASSESSMENT   Anthropometrics  Ht: 5'4" Wt:342.2 lbs There is no height or weight on file to calculate BMI.    Clinical Medical Hx: Colecystectomy, OSA, Long haul COVID-19, Depression/Anxiety Medications: Glipizide, metformin, Eliquis, lisinopril, Prozac Labs: A1c - 9.9 NEW: 8.7 (03/29/2020), Glucose - 371 Notable Signs/Symptoms: Lower leg edema, thin hair  Lifestyle & Dietary Hx  Pt went back to work in January, but long COVID symptoms took them back out of work. Pt feels they went back to work too early. Pt reports seeing an SLP due to "word searching", or not being able to find the right words to say, since having COVID. Pt reports an aversion to being in public due to fear of COVID. Pt is currently in physical therapy for their knees twice a week, has been since the beginning of March. Pt was told their range of motion in their gate was in need of correction. Pt has also had steroid injections in their knees in March. Pt is still in water aerobics 2-3 times a week. [facility]. Pt reports being more thirsty as they are more active. Pt reports not journaling their food, but still wants accountability. Pt finds it difficult to stay motivated with long term goals. Pt reports still eating once a day sometimes, states they are too lazy to get up and eat on those days. Pt is not cooking as much as they used to, makes 4-5 meals at home per week. Eats a lot of takeout, likes [employer], [employer], [place].    Estimated daily fluid intake: ~100 oz Supplements: Vitamin D, Collagen, Glucosamine, Turmeric, Sore no More topical essential oil for knee pain Sleep: OSA, still hasn't found  a comfortable mask yet. 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 Pt responds to stress by retreating, formerly would stress. Current average weekly physical activity:  Water aerobics 2-3x a week, and Yoga occasionally   24-Hr Dietary Recall First Meal: 4 mini muffins Snack:  Second Meal: Leftover mozzarella sticks Snack:  Third Meal: Cheese Ziti Snack:  Beverages: water, unsweet tea   NUTRITION DIAGNOSIS  NB-1.1 Food and nutrition-related knowledge deficit As related to diabetes.  As evidenced by A1c of 9.9, sedentary lifestlye, and skipping meals.   NUTRITION INTERVENTION  Nutrition education (E-1) on the following topics:   Educated patient on factors that contribute to elevation of blood sugars, such as stress, illness, injury,and food choices. Discussed the role that physical activity plays in lowering blood sugar. Educated patient on the importance of eating 3 meals consistenly about 5-6 hours apart to help blood sugar stay better regulated. Counseled patient on beginning to rebuild their trust in themselves to make the right food choices for their health. Educated patient on mindful eating, including listening to their body's hunger and satiety cues, as well as eating slowly and allowing meals to be more of a sensory experience.    Handouts Provided Include  NEW: Balanced Plate  Learning Style & Readiness for Change Teaching method utilized: Visual & Auditory  Demonstrated degree of understanding via: Teach Back  Barriers to learning/adherence to lifestyle change: none  Goals Established by Pt Consider getting  on the Nordictrack for 10-15 minutes before your water aerobics. If you find yourself exhibiting a behavior that you know you should do better, try to exercise some control while in the moment. Journal your food intake every day, and write your mood with each meal.  Hang your journal  on the wall across from your toilet so that you will have to look at it every day. Choose low or reduced fat food options. Try MyFitnessPal app.   MONITORING & EVALUATION Dietary intake, weekly physical activity, and meal consistency PRN  Next Steps  Patient is to log food and beverage choices and email it to dietitian before next follow up

## 2020-07-12 NOTE — Patient Instructions (Signed)
Medication Instructions:  Stop Eliquis when you are finished.   *If you need a refill on your cardiac medications before your next appointment, please call your pharmacy*  Follow-Up: At [facility], you and your health needs are our priority.  As part of our continuing mission to provide you with exceptional heart care, we have created designated Provider Care Teams.  These Care Teams include your primary Cardiologist (physician) and Advanced Practice Providers (APPs -  Physician Assistants and Nurse Practitioners) who all work together to provide you with the care you need, when you need it.  We recommend signing up for the patient portal called "MyChart".  Sign up information is provided on this After Visit Summary.  MyChart is used to connect with patients for Virtual Visits (Telemedicine).  Patients are able to view lab/test results, encounter notes, upcoming appointments, etc.  Non-urgent messages can be sent to your provider as well.   To learn more about what you can do with MyChart, go to [email or web address].    Your next appointment:   As needed  The format for your next appointment:   In Person  Provider:   Lennie Odor, MD

## 2020-07-12 NOTE — Patient Instructions (Addendum)
Consider getting on the [place] for 10-15 minutes before your water aerobics.  If you find yourself exhibiting a behavior that you know you should do better, try to exercise some control while in the moment.  Journal your food intake every day, and write your mood with each meal.  Hang your journal on the wall across from your toilet so that you will have to look at it every day.  Choose low or reduced fat food options.  Try MyFitnessPal app.

## 2020-07-14 ENCOUNTER — Ambulatory Visit: Payer: BC Managed Care – PPO | Admitting: Physical Therapy

## 2020-07-14 DIAGNOSIS — R262 Difficulty in walking, not elsewhere classified: Secondary | ICD-10-CM

## 2020-07-14 DIAGNOSIS — M6281 Muscle weakness (generalized): Secondary | ICD-10-CM

## 2020-07-14 NOTE — Therapy (Signed)
[facility] [address] [place], [address] Phone: [phone]   Fax:  [phone]  Physical Therapy Treatment  Patient Details  Name: Kristy Conrad MRN: [id number] Date of Birth: 03/19/1975 No data recorded  Encounter Date: 07/14/2020   Past Medical History:  Diagnosis Date   Depression    Diabetes mellitus    Hyperlipidemia    Hypertension    Obesity    PCOS (polycystic ovarian syndrome)    Pulmonary embolism ([place])     Past Surgical History:  Procedure Laterality Date   BREAST SURGERY     breast reduction   CHOLECYSTECTOMY     WISDOM TOOTH EXTRACTION      There were no vitals filed for this visit.   Subjective Assessment - 07/14/20 1557     Subjective Reports experiencing increased pain in right knee and right ankle to point where right ankle throbs at night.    Limitations Standing;Walking;House hold activities    How long can you sit comfortably? unlimited    How long can you stand comfortably? 10 min    How long can you walk comfortably? 3 min    Diagnostic tests CT scan, XRAY    Currently in Pain? Yes    Pain Score 2     Pain Location Ankle    Pain Onset More than a month ago            THEREX:   6 MINUTE WALK TEST: - distance ambulated: 900 ft  - assistive device used: none - supplemental O2 requirements/used: none - Dyspnea Scale: 8  - RPE: 6 or 7 -Self Terminated at  9 lap, 4 min 47 sec because of right ankle 5 to 6/10  - Took standing rest break at 2 min 37 sec      Double Heel Raise with Single Leg Eccentric 1 x 10 with BUE support  -VC to come up on good leg and lower on bad leg  -VC to use both legs if too painful  Standing Calf Stretch  2 x 30 sec    Educated patient on achilles tendinopathy and provided informational pamphlet      PT Short Term Goals - 07/14/20 1609       PT SHORT TERM GOAL #1   Title Patient will show independence with HEP to  improve cardiovascular ability and LE strength    Baseline 02/29/20- dependent, 04/25/20: still working on it; 5/18: Performs parts of HEP everyday. 7/11: Consistently performs HEP    Time 3    Period Weeks    Status Achieved               PT Long Term Goals - 07/14/20 1606       PT LONG TERM GOAL #1   Title Patient will improve 6MWT by 362f to show improved cardivascular ability    Baseline 02/29/20- 673 ft; 5/18: 960'.5"; 6/13: 838' 7/11: 1012' 7/14:    Time 4    Period Weeks    Status Achieved      PT LONG TERM GOAL #2   Title Patient will improve performance in 30s sit to stand test to 15 reps show improved LE strength    Baseline 02/29/20- 9reps, 04/25/20: 11 reps; 5/18: 17 reps    Time 6    Period Weeks    Status Achieved      PT LONG TERM GOAL #3   Title Patient will be able to  walk for 5 min without taking a break to show improved cardiavasuclar function between an RPE of 6-8/10    Baseline 02/29/20- 3 min, 04/25/20: 3 min; 05/18/20: 3 min, 15 sec.; 6/13: 3 min, 30 sec 7/11: 5 min , 15 sec 1 standing rest break    Time 6    Period Weeks    Status Achieved      PT LONG TERM GOAL #4   Title Pt will improve FOTO to target score to indicate functional mobility improvements.    Baseline 04/06/20: 45/58; 05/18/20: 56/58; 6/13: 58/58    Time 4    Period Weeks    Status Achieved      PT LONG TERM GOAL #5   Title Pt will be able to perform 10 m gait speed at 1.0 m/s to display improved ability to perform community distances.    Baseline 6/13: 0.85 m/s    Time 6    Period Weeks    Status New            HEP includes:   Access Code: [facility] URL: [email or web address] Date: 07/14/2020 Prepared by: Bradly Chris  Exercises Standing March with Counter Support - 1 x daily - 3 x weekly - 3 sets - 10 reps Standing Alternating Knee Flexion - 1 x daily - 3 x weekly - 3 sets - 10 reps Sit to Stand - 1 x daily - 3 x weekly - 3 sets - 10 reps Standing Hip  Abduction with Counter Support - 1 x daily - 3 x weekly - 3 sets - 10 reps Single Leg Stance - 1 x daily - 3 x weekly - 1 sets - 5 reps - 30 hold Standing Eccentric Heel Raise - 1 x daily - 3 x weekly - 2 sets - 10 reps Supine Lower Trunk Rotation - 1 x daily - 7 x weekly - 3 sets - 10 reps - 2 hold Gastroc Stretch on Wall - 1 x daily - 7 x weekly - 1 sets - 5 reps - 30 hold       Plan - 07/14/20 1628     Clinical Impression Statement Pt presents for f/u for difficulty walking and generalized weakness. Pt unable to reach previous 6 mWT distance due to exacerbation of right ankle pain secondary to achilles tendinopathy. Remainder of session went into discussing achilles tendinopathy pathology and exercises to treat it. She was able to perform eccentric heel raises without increasing her right ankle pain beyond 4/10. While pt has met all of her PT goals at this point, she will continue to benefit from skilled PT to decrease right ankle pain in order to maintain ability to ambulate prolonged distances without being limited by pain such as from her car to school or standing for prolonged periods of time for her job.    Personal Factors and Comorbidities Comorbidity 2    Comorbidities Obesity, Diabetes    Examination-Activity Limitations Squat;Stairs;Stand;Other;Transfers;Locomotion Level    Examination-Participation Restrictions Occupation;Other    Stability/Clinical Decision Making Evolving/Moderate complexity    Rehab Potential Good    PT Frequency 2x / week    PT Duration 8 weeks    PT Treatment/Interventions ADLs/Self Care Home Management;Cryotherapy;Electrical Stimulation;Moist Heat;Ultrasound;Gait training;Stair training;Functional mobility training;Therapeutic activities;Therapeutic exercise;Balance training;Neuromuscular re-education;Manual techniques;Passive range of motion;Energy conservation;Spinal Manipulations;Joint Manipulations    PT Next Visit Plan Take FOTO.    PT Home Exercise  Plan Access Code: [facility]    Consulted and Agree with Plan of Care Patient  Patient will benefit from skilled therapeutic intervention in order to improve the following deficits and impairments:  Abnormal gait, Cardiopulmonary status limiting activity, Decreased activity tolerance, Decreased balance, Decreased coordination, Decreased endurance, Decreased mobility, Decreased range of motion, Decreased strength, Difficulty walking, Hypomobility, Pain  Visit Diagnosis: Difficulty in walking, not elsewhere classified  Muscle weakness (generalized)     Problem List Patient Active Problem List   Diagnosis Date Noted   COVID-19 long hauler manifesting chronic concentration deficit 04/05/2020   Hip pain 03/29/2020   Pulmonary embolism ([place]) 12/28/2019   Postural dizziness with presyncope 12/03/2019   History of COVID-19 11/03/2019   Shortness of breath 11/03/2019   Acute respiratory failure with hypoxia ([facility]) 11/03/2019   Generalized anxiety disorder 02/23/2019   Sunburn 08/21/2018   Chronic pain of right knee 03/27/2017   Transient adjustment reaction with anxiety 05/02/2015   Edema 01/19/2013   OSA (obstructive sleep apnea) 09/15/2012   Obesity, morbid, BMI 50 or higher ([place]) 11/12/2011   Diabetes mellitus with renal complications ([place]) 64/35/3912   POLYCYSTIC OVARIES 07/08/2009   DEPRESSION, MAJOR, RECURRENT, MODERATE 07/08/2009   Bradly Chris PT, DPT  07/15/2020, 9:56 AM  [facility] [address] [place], [address] Phone: [phone]   Fax:  [phone]  Name: Kristy Conrad MRN: [id number] Date of Birth: 12-20-1975

## 2020-07-17 ENCOUNTER — Other Ambulatory Visit: Payer: Self-pay | Admitting: Family Medicine

## 2020-07-18 NOTE — Telephone Encounter (Signed)
Please schedule follow up appointment with Dr. Selena Batten.  Per last office note, patient was suppose to follow up around 06/29/2020.

## 2020-07-19 ENCOUNTER — Ambulatory Visit: Payer: BC Managed Care – PPO | Admitting: Physical Therapy

## 2020-07-19 DIAGNOSIS — R262 Difficulty in walking, not elsewhere classified: Secondary | ICD-10-CM

## 2020-07-19 DIAGNOSIS — M6281 Muscle weakness (generalized): Secondary | ICD-10-CM

## 2020-07-19 NOTE — Therapy (Signed)
[facility] [address], [place], [address] Phone: [phone]   Fax:  [phone]  Physical Therapy Treatment  Patient Details  Name: Kristy Conrad MRN: [id number] Date of Birth: 01-11-1975 No data recorded  Encounter Date: 07/19/2020   PT End of Session - 07/19/20 1556     Visit Number 29    Number of Visits 32    Date for PT Re-Evaluation 07/25/20    PT Start Time 1545    PT Stop Time 1630    PT Time Calculation (min) 45 min    Activity Tolerance Patient tolerated treatment well;Patient limited by pain    Behavior During Therapy WFL for tasks assessed/performed             Past Medical History:  Diagnosis Date   Depression    Diabetes mellitus    Hyperlipidemia    Hypertension    Obesity    PCOS (polycystic ovarian syndrome)    Pulmonary embolism ([facility])     Past Surgical History:  Procedure Laterality Date   BREAST SURGERY     breast reduction   CHOLECYSTECTOMY     WISDOM TOOTH EXTRACTION      There were no vitals filed for this visit.   Subjective Assessment - 07/19/20 1551     Subjective Pt reports having increased R ankle pain when walking. She has had to stop walking for exercise as a result    Limitations Standing;Walking;House hold activities    How long can you sit comfortably? unlimited    How long can you stand comfortably? 10 min    How long can you walk comfortably? 3 min    Diagnostic tests CT scan, XRAY    Pain Score 4     Pain Location Ankle    Pain Orientation Right    Pain Descriptors / Indicators Aching    Pain Type Acute pain    Pain Onset More than a month ago            THEREX:  Ankle Ext/DF 2 x 15 with wobble board   Ankle Ev/Inv 2 x 15 with wobble board  Right Calf Stretch 2 x 30 sec   Right Soleus Stretch 2 x 30 sec    Banded PF with emphasis on eccentric phase 2 x 15 Green TB   Education on wearing proper footwear and purchasing a pair  of sneakers that has lower heel to prevent knee flex.   Reports 2/10 after session    PT Education - 07/19/20 1552     Education Details form/technique with exercise    Person(s) Educated Patient    Methods Verbal cues    Comprehension Verbalized understanding;Returned demonstration;Verbal cues required              PT Short Term Goals - 07/19/20 1559       PT SHORT TERM GOAL #1   Title Patient will show independence with HEP to improve cardiovascular ability and LE strength    Baseline 02/29/20- dependent, 04/25/20: still working on it; 5/18: Performs parts of HEP everyday. 7/11: Consistently performs HEP    Time 3    Period Weeks    Status Achieved               PT Long Term Goals - 07/19/20 1600       PT LONG TERM GOAL #1   Title Patient will improve by 3102ft to show improved cardivascular  ability    Baseline 02/29/20- 673 ft; 5/18: 960'.5"; 6/13: 838' 7/11: 1012'    Time 4    Period Weeks    Status Achieved    Target Date 07/25/20      PT LONG TERM GOAL #2   Title Patient will improve performance in 30s sit to stand test to 15 reps show improved LE strength    Baseline 02/29/20- 9reps, 04/25/20: 11 reps; 5/18: 17 reps    Time 6    Period Weeks    Status Achieved      PT LONG TERM GOAL #3   Title Patient will be able to walk for 5 min without taking a break to show improved cardiavasuclar function between an RPE of 6-8/10    Baseline 02/29/20- 3 min, 04/25/20: 3 min; 05/18/20: 3 min, 15 sec.; 6/13: 3 min, 30 sec 7/11: 5 min , 15 sec 1 standing rest break    Time 6    Period Weeks    Status Achieved    Target Date 07/25/20      PT LONG TERM GOAL #4   Title Pt will improve FOTO to target score to indicate functional mobility improvements.    Baseline 04/06/20: 45/58; 05/18/20: 56/58; 6/13: 58/58    Time 4    Period Weeks    Status Achieved    Target Date 07/25/20      PT LONG TERM GOAL #5   Title Pt will be able to perform 10 m gait speed at 1.0 m/s to  display improved ability to perform community distances.    Baseline 6/13: 0.85 m/s    Time 6    Period Weeks    Status New    Target Date 07/25/20                   Plan - 07/19/20 1603     Clinical Impression Statement Pt presents for f/u for difficulty walking and generalized weakness secondary to COVID. She was limited throughout session by pain from achilles tendinopathy in her right ankle. Pt did tolerate stretching and strengthening exercises well and even had a decrease in her pain from beginning to end of session. She will continue to benefit from skilled PT to progress aerobic endurance, LE strength, and to decrease R ankle pain to be able to complete ambulatory distances required for her job.    Personal Factors and Comorbidities Comorbidity 2    Comorbidities Obesity, Diabetes    Examination-Activity Limitations Squat;Stairs;Stand;Other;Transfers;Locomotion Level    Examination-Participation Restrictions Occupation;Other    Stability/Clinical Decision Making Evolving/Moderate complexity    Rehab Potential Good    PT Frequency 2x / week    PT Duration 8 weeks    PT Treatment/Interventions ADLs/Self Care Home Management;Cryotherapy;Electrical Stimulation;Moist Heat;Ultrasound;Gait training;Stair training;Functional mobility training;Therapeutic activities;Therapeutic exercise;Balance training;Neuromuscular re-education;Manual techniques;Passive range of motion;Energy conservation;Spinal Manipulations;Joint Manipulations    PT Next Visit Plan Progress ankle strengthening and flexibility exercises    PT Home Exercise Plan Access Code: [facility]    Consulted and Agree with Plan of Care Patient            HEP includes: Access Code: [facility] URL: [email or web address] Date: 07/19/2020 Prepared by: Ellin Goodie  Exercises Standing March with Counter Support - 1 x daily - 3 x weekly - 3 sets - 10 reps Standing Alternating Knee Flexion - 1 x daily - 3 x  weekly - 3 sets - 10 reps Sit to Stand - 1 x daily - 3 x weekly -  3 sets - 10 reps Standing Hip Abduction with Counter Support - 1 x daily - 3 x weekly - 3 sets - 10 reps Single Leg Stance - 1 x daily - 3 x weekly - 1 sets - 5 reps - 30 hold Standing Eccentric Heel Raise - 1 x daily - 3 x weekly - 2 sets - 10 reps Supine Lower Trunk Rotation - 1 x daily - 7 x weekly - 3 sets - 10 reps - 2 hold Gastroc Stretch on Wall - 1 x daily - 7 x weekly - 1 sets - 5 reps - 30 hold Seated Ankle Plantar Flexion with Resistance Loop - 1 x daily - 7 x weekly - 2 sets - 15 reps   Patient will benefit from skilled therapeutic intervention in order to improve the following deficits and impairments:  Abnormal gait, Cardiopulmonary status limiting activity, Decreased activity tolerance, Decreased balance, Decreased coordination, Decreased endurance, Decreased mobility, Decreased range of motion, Decreased strength, Difficulty walking, Hypomobility, Pain  Visit Diagnosis: Difficulty in walking, not elsewhere classified  Muscle weakness (generalized)     Problem List Patient Active Problem List   Diagnosis Date Noted   COVID-19 long hauler manifesting chronic concentration deficit 04/05/2020   Hip pain 03/29/2020   Pulmonary embolism ([facility]) 12/28/2019   Postural dizziness with presyncope 12/03/2019   History of COVID-19 11/03/2019   Shortness of breath 11/03/2019   Acute respiratory failure with hypoxia ([facility]) 11/03/2019   Generalized anxiety disorder 02/23/2019   Sunburn 08/21/2018   Chronic pain of right knee 03/27/2017   Transient adjustment reaction with anxiety 05/02/2015   Edema 01/19/2013   OSA (obstructive sleep apnea) 09/15/2012   Obesity, morbid, BMI 50 or higher ([facility]) 11/12/2011   Diabetes mellitus with renal complications ([facility]) 07/08/2009   POLYCYSTIC OVARIES 07/08/2009   DEPRESSION, MAJOR, RECURRENT, MODERATE 07/08/2009   Ellin Goodie PT, DPT  07/20/2020, 8:11 AM  [facility] [address] [place], [address] Phone: [phone]   Fax:  [phone]  Name: Kristy Conrad MRN: [id number] Date of Birth: 1975/04/15

## 2020-07-20 ENCOUNTER — Ambulatory Visit: Payer: BC Managed Care – PPO | Admitting: Physical Therapy

## 2020-07-20 DIAGNOSIS — R262 Difficulty in walking, not elsewhere classified: Secondary | ICD-10-CM | POA: Diagnosis not present

## 2020-07-20 DIAGNOSIS — M6281 Muscle weakness (generalized): Secondary | ICD-10-CM

## 2020-07-20 NOTE — Therapy (Signed)
[place] [facility] [address] [place], [address] Phone: [phone]   Fax:  [phone]  Physical Therapy Progress Note    Reporting Period: 06/13/20-07/20/20  Patient Details  Name: Kristy Conrad MRN: [id number] Date of Birth: 04/18/1975 No data recorded  Encounter Date: 07/20/2020   PT End of Session - 07/20/20 0815     Visit Number 30    Number of Visits 32    Date for PT Re-Evaluation 07/25/20    PT Start Time 0800    PT Stop Time 0845    PT Time Calculation (min) 45 min             Past Medical History:  Diagnosis Date   Depression    Diabetes mellitus    Hyperlipidemia    Hypertension    Obesity    PCOS (polycystic ovarian syndrome)    Pulmonary embolism (HCC)     Past Surgical History:  Procedure Laterality Date   BREAST SURGERY     breast reduction   CHOLECYSTECTOMY     WISDOM TOOTH EXTRACTION      There were no vitals filed for this visit.   Subjective Assessment - 07/20/20 0812     Subjective Pt reports experiencing the same amount of right ankle and knee pain as yesterday. She states that she will be seeing her PCP soon and she will mention recent ankle pain.    Limitations Standing;Walking;House hold activities    How long can you sit comfortably? unlimited    How long can you stand comfortably? 10 min    How long can you walk comfortably? 3 min    Diagnostic tests CT scan, XRAY    Currently in Pain? Yes    Pain Score 3     Pain Location Ankle    Pain Orientation Right    Pain Descriptors / Indicators Aching    Pain Type Acute pain    Pain Onset More than a month ago    Multiple Pain Sites Yes            PHYSICAL PERFORMANCE   10 Meter Walk Test   Gait Speed:  0.926 sec  - Normal Gait speed Avg=   1.07 m/sec  -> 1 m/sec [employer] and cross street safely and normal WS   THEREX  PF/DF  on Rocker Board 1 x 10  EV/DF on Rocker Board 1 x 10    Resisted PF with eccentrics 2 x 10   Seated PF stretch with strap 2 x 30 sec  Seated Soleus stretch with strap 2 x 30 sec   Seated Table HS stretch 2 x 30 sec  Sidelying Quad Stretch with Strap 2 x 30 sec     Updated HEP and educated patient on changes to exercises and addition of new exercises      PT Education - 07/20/20 [phone]     Education Details form/technique with exercise    Person(s) Educated Patient    Methods Verbal cues;Handout    Comprehension Verbalized understanding;Verbal cues required;Returned demonstration              PT Short Term Goals - 07/19/20 1559       PT SHORT TERM GOAL #1   Title Patient will show independence with HEP to improve cardiovascular ability and LE strength    Baseline 02/29/20- dependent, 04/25/20: still working on it; 5/18: Performs parts of HEP everyday. 7/11: Consistently performs HEP  Time 3    Period Weeks    Status Achieved               PT Long Term Goals - 07/20/20 0830       PT LONG TERM GOAL #1   Title Patient will improve by 337ft to show improved cardivascular ability    Baseline 02/29/20- 673 ft; 5/18: 960'.5"; 6/13: 838' 7/11: 1012'    Time 4    Period Weeks    Status Achieved      PT LONG TERM GOAL #2   Title Patient will improve performance in 30s sit to stand test to 15 reps show improved LE strength    Baseline 02/29/20- 9reps, 04/25/20: 11 reps; 5/18: 17 reps    Time 6    Period Weeks    Status Achieved      PT LONG TERM GOAL #3   Title Patient will be able to walk for 5 min without taking a break to show improved cardiavasuclar function between an RPE of 6-8/10    Baseline 02/29/20- 3 min, 04/25/20: 3 min; 05/18/20: 3 min, 15 sec.; 6/13: 3 min, 30 sec 7/11: 5 min , 15 sec 1 standing rest break    Time 6    Period Weeks    Status Achieved      PT LONG TERM GOAL #4   Title Pt will improve FOTO to target score to indicate functional mobility improvements.    Baseline 04/06/20: 45/58; 05/18/20:  56/58; 6/13: 58/58    Time 4    Period Weeks    Status Achieved      PT LONG TERM GOAL #5   Title Pt will be able to perform 10 m gait speed at 1.0 m/s to display improved ability to perform community distances.    Baseline 6/13: 0.85 m/s    Time 6    Period Weeks    Status New                   Plan - 07/20/20 0816     Personal Factors and Comorbidities Comorbidity 2    Comorbidities Obesity, Diabetes    Examination-Activity Limitations Squat;Stairs;Stand;Other;Transfers;Locomotion Level    Examination-Participation Restrictions Occupation;Other    Stability/Clinical Decision Making Evolving/Moderate complexity    Rehab Potential Good    PT Frequency 2x / week    PT Duration 8 weeks    PT Treatment/Interventions ADLs/Self Care Home Management;Cryotherapy;Electrical Stimulation;Moist Heat;Ultrasound;Gait training;Stair training;Functional mobility training;Therapeutic activities;Therapeutic exercise;Balance training;Neuromuscular re-education;Manual techniques;Passive range of motion;Energy conservation;Spinal Manipulations;Joint Manipulations    PT Next Visit Plan Progress ankle strengthening and flexibility exercises    PT Home Exercise Plan Access Code: [facility]    Consulted and Agree with Plan of Care Patient            HEP includes:  Access Code: [facility] URL: [email or web address] Date: 07/20/2020 Prepared by: Ellin Goodie  Exercises Standing March with Counter Support - 1 x daily - 3 x weekly - 3 sets - 10 reps Standing Alternating Knee Flexion - 1 x daily - 3 x weekly - 3 sets - 10 reps Sit to Stand - 1 x daily - 3 x weekly - 3 sets - 10 reps Standing Hip Abduction with Counter Support - 1 x daily - 3 x weekly - 3 sets - 10 reps Single Leg Stance - 1 x daily - 3 x weekly - 1 sets - 5 reps - 30 hold Standing Eccentric Heel Raise - 1 x daily -  3 x weekly - 2 sets - 10 reps Supine Lower Trunk Rotation - 1 x daily - 7 x weekly - 3 sets - 10  reps - 2 hold Gastroc Stretch on Wall - 1 x daily - 7 x weekly - 1 sets - 5 reps - 30 hold Seated Ankle Plantar Flexion with Resistance Loop - 1 x daily - 7 x weekly - 2 sets - 15 reps Seated Table Hamstring Stretch - 1 x daily - 7 x weekly - 1 sets - 5 reps - 30 hold Sidelying Quadriceps Stretch with Strap - 1 x daily - 7 x weekly - 1 sets - 5 reps - 30 hold    Patient will benefit from skilled therapeutic intervention in order to improve the following deficits and impairments:  Abnormal gait, Cardiopulmonary status limiting activity, Decreased activity tolerance, Decreased balance, Decreased coordination, Decreased endurance, Decreased mobility, Decreased range of motion, Decreased strength, Difficulty walking, Hypomobility, Pain  Visit Diagnosis: Difficulty in walking, not elsewhere classified  Muscle weakness (generalized)     Problem List Patient Active Problem List   Diagnosis Date Noted   COVID-19 long hauler manifesting chronic concentration deficit 04/05/2020   Hip pain 03/29/2020   Pulmonary embolism (HCC) 12/28/2019   Postural dizziness with presyncope 12/03/2019   History of COVID-19 11/03/2019   Shortness of breath 11/03/2019   Acute respiratory failure with hypoxia (HCC) 11/03/2019   Generalized anxiety disorder 02/23/2019   Sunburn 08/21/2018   Chronic pain of right knee 03/27/2017   Transient adjustment reaction with anxiety 05/02/2015   Edema 01/19/2013   OSA (obstructive sleep apnea) 09/15/2012   Obesity, morbid, BMI 50 or higher (HCC) 11/12/2011   Diabetes mellitus with renal complications (HCC) 07/08/2009   POLYCYSTIC OVARIES 07/08/2009   DEPRESSION, MAJOR, RECURRENT, MODERATE 07/08/2009   Ellin Goodie PT, DPT  07/20/2020, 9:40 AM  [place] [facility] [address] [place], [address] Phone: [phone]   Fax:  [phone]  Name: MALEKA CONTINO MRN: [id number] Date of Birth:  Mar 07, 1975

## 2020-07-21 ENCOUNTER — Encounter: Payer: BC Managed Care – PPO | Admitting: Physical Therapy

## 2020-07-25 ENCOUNTER — Ambulatory Visit: Payer: BC Managed Care – PPO | Admitting: Physical Therapy

## 2020-07-25 DIAGNOSIS — R262 Difficulty in walking, not elsewhere classified: Secondary | ICD-10-CM | POA: Diagnosis not present

## 2020-07-25 DIAGNOSIS — M6281 Muscle weakness (generalized): Secondary | ICD-10-CM

## 2020-07-25 NOTE — Therapy (Signed)
[place] [facility] [address] [place], [address] Phone: [phone]   Fax:  [phone]  Physical Therapy Treatment  Patient Details  Name: Kristy Conrad MRN: [id number] Date of Birth: 1975-10-02 No data recorded  Encounter Date: 07/25/2020   PT End of Session - 07/25/20 1037     Visit Number 31    Number of Visits 9    Date for PT Re-Evaluation 07/25/20    PT Start Time 1025    PT Stop Time 1100    PT Time Calculation (min) 35 min             Past Medical History:  Diagnosis Date   Depression    Diabetes mellitus    Hyperlipidemia    Hypertension    Obesity    PCOS (polycystic ovarian syndrome)    Pulmonary embolism ([place])     Past Surgical History:  Procedure Laterality Date   BREAST SURGERY     breast reduction   CHOLECYSTECTOMY     WISDOM TOOTH EXTRACTION      There were no vitals filed for this visit.   Subjective Assessment - 07/25/20 1027     Subjective Pt reports feeling significantly less pain since she started taking her arthritis medication again.    Limitations Standing;Walking;House hold activities    How long can you sit comfortably? unlimited    How long can you stand comfortably? 10 min    How long can you walk comfortably? 3 min    Diagnostic tests CT scan, XRAY    Currently in Pain? Yes    Pain Score 1     Pain Location Ankle    Pain Orientation Right    Pain Descriptors / Indicators Aching    Pain Type Acute pain    Pain Onset More than a month ago    Pain Frequency Constant            THEREX   Seated HS 2 x 30 sec with strap   Standing Double Heel Raise with single leg eccentric on RLE and 1 UE support 2 x 10  -VC to use two legs to decrease slowness of decent   Soleus with GTB 2 x10    Updated HEP and educated patient on changes to exercises and addition of new exercises       PT Education - 07/25/20 1036     Education Details  form/technique with exercise    Person(s) Educated Patient    Methods Handout    Comprehension Verbalized understanding;Returned demonstration;Verbal cues required              PT Short Term Goals - 07/25/20 1354       PT SHORT TERM GOAL #1   Title Patient will show independence with HEP to improve cardiovascular ability and LE strength    Baseline 02/29/20- dependent, 04/25/20: still working on it; 5/18: Performs parts of HEP everyday. 7/11: Consistently performs HEP    Time 3    Period Weeks    Status Achieved               PT Long Term Goals - 07/25/20 1034       PT LONG TERM GOAL #1   Title Patient will improve 6MWT by 339f to show improved cardivascular ability    Baseline 02/29/20- 673 ft; 5/18: 960'.5"; 6/13: 838' 7/11: 1012'    Time 4    Period Weeks  Status Achieved      PT LONG TERM GOAL #2   Title Patient will improve performance in 30s sit to stand test to 15 reps show improved LE strength    Baseline 02/29/20- 9reps, 04/25/20: 11 reps; 5/18: 17 reps    Time 6    Period Weeks    Status Achieved      PT LONG TERM GOAL #3   Title Patient will be able to walk for 5 min without taking a break to show improved cardiavasuclar function between an RPE of 6-8/10    Baseline 02/29/20- 3 min, 04/25/20: 3 min; 05/18/20: 3 min, 15 sec.; 6/13: 3 min, 30 sec 7/11: 5 min , 15 sec 1 standing rest break    Time 6    Period Weeks    Status Achieved      PT LONG TERM GOAL #4   Title Pt will improve FOTO to target score to indicate functional mobility improvements.    Baseline 04/06/20: 45/58; 05/18/20: 56/58; 6/13: 58/58    Time 4    Period Weeks    Status Achieved      PT LONG TERM GOAL #5   Title Pt will be able to perform 10 m gait speed at 1.0 m/s to display improved ability to perform community distances.    Baseline 7/20: 0.85 m/s    Time 6    Period Weeks    Status Partially Met             HEP includes the following:   Access Code: [facility] URL:  [email or web address] Date: 07/25/2020 Prepared by: Bradly Chris  Exercises Standing March with Counter Support - 1 x daily - 3 x weekly - 3 sets - 10 reps Standing Alternating Knee Flexion - 1 x daily - 3 x weekly - 3 sets - 10 reps Sit to Stand - 1 x daily - 3 x weekly - 3 sets - 10 reps Standing Hip Abduction with Counter Support - 1 x daily - 3 x weekly - 3 sets - 10 reps Single Leg Stance - 1 x daily - 3 x weekly - 1 sets - 5 reps - 30 hold Standing Eccentric Heel Raise - 1 x daily - 3 x weekly - 2 sets - 10 reps Supine Lower Trunk Rotation - 1 x daily - 7 x weekly - 3 sets - 10 reps - 2 hold Seated Table Hamstring Stretch - 1 x daily - 7 x weekly - 1 sets - 5 reps - 30 hold Sidelying Quadriceps Stretch with Strap - 1 x daily - 7 x weekly - 1 sets - 5 reps - 30 hold       Plan - 07/25/20 1051     Clinical Impression Statement Pt presents for f/u for difficulty walking and generalized weakness 2/2 COVID. Because of her abilitiy to manage pain through adherence to medicine regimen, pt shows increased ability to tolerate standing, eccentric gastroc strengthening exercises without an increase in her pain. She will continue to benefit from skilled PT to decrease right heel pain in order to return to ambulating to be able to negotiate community level distances. Next session is her last apt for plan of care, she will be educated on HEP and transition plan for d/c from PT.    Personal Factors and Comorbidities Comorbidity 2    Comorbidities Obesity, Diabetes    Examination-Activity Limitations Squat;Stairs;Stand;Other;Transfers;Locomotion Level    Examination-Participation Restrictions Occupation;Other    Stability/Clinical Decision Making Evolving/Moderate complexity  Rehab Potential Good    PT Frequency 2x / week    PT Duration 8 weeks    PT Treatment/Interventions ADLs/Self Care Home Management;Cryotherapy;Electrical Stimulation;Moist Heat;Ultrasound;Gait  training;Stair training;Functional mobility training;Therapeutic activities;Therapeutic exercise;Balance training;Neuromuscular re-education;Manual techniques;Passive range of motion;Energy conservation;Spinal Manipulations;Joint Manipulations    PT Next Visit Plan Review HEP and check pt understanding and confidence for d/c    PT Home Exercise Plan Access Code: [facility]    Consulted and Agree with Plan of Care Patient             Patient will benefit from skilled therapeutic intervention in order to improve the following deficits and impairments:  Abnormal gait, Cardiopulmonary status limiting activity, Decreased activity tolerance, Decreased balance, Decreased coordination, Decreased endurance, Decreased mobility, Decreased range of motion, Decreased strength, Difficulty walking, Hypomobility, Pain  Visit Diagnosis: Difficulty in walking, not elsewhere classified  Muscle weakness (generalized)     Problem List Patient Active Problem List   Diagnosis Date Noted   COVID-19 long hauler manifesting chronic concentration deficit 04/05/2020   Hip pain 03/29/2020   Pulmonary embolism ([place]) 12/28/2019   Postural dizziness with presyncope 12/03/2019   History of COVID-19 11/03/2019   Shortness of breath 11/03/2019   Acute respiratory failure with hypoxia (HCC) 11/03/2019   Generalized anxiety disorder 02/23/2019   Sunburn 08/21/2018   Chronic pain of right knee 03/27/2017   Transient adjustment reaction with anxiety 05/02/2015   Edema 01/19/2013   OSA (obstructive sleep apnea) 09/15/2012   Obesity, morbid, BMI 50 or higher ([place]) 11/12/2011   Diabetes mellitus with renal complications ([place]) 01/77/9390   POLYCYSTIC OVARIES 07/08/2009   DEPRESSION, MAJOR, RECURRENT, MODERATE 07/08/2009   Bradly Chris PT, DPT  07/25/2020, 2:00 PM  [place] [facility] [address] [place], [address] Phone: [phone]   Fax:   [phone]  Name: Kristy Conrad MRN: [id number] Date of Birth: 08-02-1975

## 2020-07-26 ENCOUNTER — Encounter: Payer: BC Managed Care – PPO | Admitting: Physical Therapy

## 2020-07-27 LAB — HM MAMMOGRAPHY

## 2020-07-27 NOTE — Telephone Encounter (Signed)
Left voice message to call the office  

## 2020-07-28 ENCOUNTER — Other Ambulatory Visit: Payer: Self-pay | Admitting: Family Medicine

## 2020-07-28 ENCOUNTER — Ambulatory Visit: Payer: BC Managed Care – PPO | Admitting: Physical Therapy

## 2020-07-28 DIAGNOSIS — G8929 Other chronic pain: Secondary | ICD-10-CM

## 2020-07-28 DIAGNOSIS — R262 Difficulty in walking, not elsewhere classified: Secondary | ICD-10-CM | POA: Diagnosis not present

## 2020-07-28 DIAGNOSIS — M6281 Muscle weakness (generalized): Secondary | ICD-10-CM

## 2020-07-28 DIAGNOSIS — M25561 Pain in right knee: Secondary | ICD-10-CM

## 2020-07-28 NOTE — Therapy (Addendum)
[place] [facility] [address], [place], [address] Phone: [phone]   Fax:  [phone]  Physical Therapy Treatment/Discharge Summary   Episode of Care: 02/29/20-07/28/20  Patient Details  Name: Kristy Conrad MRN: [id number] Date of Birth: [birth year] No data recorded  Encounter Date: 07/28/2020   PT End of Session - 07/28/20 0813     Visit Number 32    Number of Visits 32    Date for PT Re-Evaluation 07/25/20    PT Start Time 0810    PT Stop Time 0845    PT Time Calculation (min) 35 min             Past Medical History:  Diagnosis Date   Depression    Diabetes mellitus    Hyperlipidemia    Hypertension    Obesity    PCOS (polycystic ovarian syndrome)    Pulmonary embolism ([place])     Past Surgical History:  Procedure Laterality Date   BREAST SURGERY     breast reduction   CHOLECYSTECTOMY     WISDOM TOOTH EXTRACTION      There were no vitals filed for this visit.   Subjective Assessment - 07/28/20 0811     Subjective Pt reports that her right ankle pain has been down to a 1 since last visit.    Limitations Standing;Walking;House hold activities    How long can you sit comfortably? unlimited    How long can you stand comfortably? 10 min    How long can you walk comfortably? 3 min    Diagnostic tests CT scan, XRAY    Currently in Pain? Yes    Pain Score 1     Pain Location Ankle    Pain Orientation Right    Pain Descriptors / Indicators Aching    Pain Type Acute pain    Pain Onset More than a month ago            EVAL   10 Meter Walk Test   Gait Speed: 0.87 m/sec   -> 1 m/sec [employer] and cross street safely and normal WS >1 m/sec Need Intervention to reduce falls risk     THEREX:   Reviewed HEP and clarified confusion.   Access Code: [facility] URL: [email or web address] Date: 07/28/2020 Prepared by: Bradly Chris  Exercises Standing  March with Counter Support - 1 x daily - 3 x weekly - 3 sets - 10 reps Standing Alternating Knee Flexion - 1 x daily - 3 x weekly - 3 sets - 10 reps Sit to Stand - 1 x daily - 3 x weekly - 3 sets - 10 reps Standing Hip Abduction with Counter Support - 1 x daily - 3 x weekly - 3 sets - 10 reps Single Leg Stance - 1 x daily - 3 x weekly - 1 sets - 5 reps - 30 hold Standing Eccentric Heel Raise - 1 x daily - 3 x weekly - 2 sets - 10 reps Supine Lower Trunk Rotation - 1 x daily - 7 x weekly - 3 sets - 10 reps - 2 hold Seated Table Hamstring Stretch - 1 x daily - 7 x weekly - 1 sets - 5 reps - 30 hold Sidelying Quadriceps Stretch with Strap - 1 x daily - 7 x weekly - 1 sets - 5 reps - 30 hold Long Sitting Eccentric Ankle Plantar Flexion with Resistance - 1 x daily - 3 x weekly - 2 sets -  10 reps Gastroc Stretch on Wall - 1 x daily - 7 x weekly - 1 sets - 5 reps - 30 hold Soleus Stretch on Wall - 1 x daily - 3 x weekly - 1 sets - 5 reps - 30 hold   Patient educated on S.M.A.R.T goals and walking program.  -Encouraged to start formulating short and long term goals and to continue walking to meet aerobic guidelines and build up LE endurance.        PT Education - 07/28/20 0813     Education Details form/technique with exercise    Person(s) Educated Patient    Methods Handout    Comprehension Verbalized understanding;Verbal cues required;Returned demonstration              PT Short Term Goals - 07/28/20 0816       PT SHORT TERM GOAL #1   Title Patient will show independence with HEP to improve cardiovascular ability and LE strength    Baseline 02/29/20- dependent, 04/25/20: still working on it; 5/18: Performs parts of HEP everyday. 7/11: Consistently performs HEP    Time 3    Period Weeks    Status Achieved               PT Long Term Goals - 07/28/20 4076       PT LONG TERM GOAL #1   Title Patient will improve 6MWT by 392f to show improved cardivascular ability     Baseline 02/29/20- 673 ft; 5/18: 960'.5"; 6/13: 838' 7/11: 1012'    Time 4    Period Weeks    Status Achieved      PT LONG TERM GOAL #2   Title Patient will improve performance in 30s sit to stand test to 15 reps show improved LE strength    Baseline 02/29/20- 9reps, 04/25/20: 11 reps; 5/18: 17 reps    Time 6    Period Weeks    Status Achieved      PT LONG TERM GOAL #3   Title Patient will be able to walk for 5 min without taking a break to show improved cardiavasuclar function between an RPE of 6-8/10    Baseline 02/29/20- 3 min, 04/25/20: 3 min; 05/18/20: 3 min, 15 sec.; 6/13: 3 min, 30 sec 7/11: 5 min , 15 sec 1 standing rest break    Time 6    Period Weeks    Status Achieved      PT LONG TERM GOAL #4   Title Pt will improve FOTO to target score to indicate functional mobility improvements.    Baseline 04/06/20: 45/58; 05/18/20: 56/58; 6/13: 58/58 7/28: 60/58    Time 4    Period Weeks    Status Achieved      PT LONG TERM GOAL #5   Title Pt will be able to perform 10 m gait speed at 1.0 m/s to display improved ability to perform community distances.    Baseline 7/20: 0.85 m/s 7/28: 0.87 m/s    Time 6    Period Weeks    Status Partially Met                   Plan - 07/28/20 0837     Clinical Impression Statement Pt presents for final apt in plan of care for generalized weakness 2/2 COVID. She has met nearly all her goals and exceeded FOTO score, and she was provided with an extensive HEP and demonstrated understanding of HEP. PT recommends that pt is ready for  discharge.    Personal Factors and Comorbidities Comorbidity 2    Comorbidities Obesity, Diabetes    Examination-Activity Limitations Squat;Stairs;Stand;Other;Transfers;Locomotion Level    Examination-Participation Restrictions Occupation;Other    Stability/Clinical Decision Making Evolving/Moderate complexity    Rehab Potential Good    PT Frequency 2x / week    PT Duration 8 weeks    PT Treatment/Interventions  ADLs/Self Care Home Management;Cryotherapy;Electrical Stimulation;Moist Heat;Ultrasound;Gait training;Stair training;Functional mobility training;Therapeutic activities;Therapeutic exercise;Balance training;Neuromuscular re-education;Manual techniques;Passive range of motion;Energy conservation;Spinal Manipulations;Joint Manipulations    PT Next Visit Plan N/a    PT Home Exercise Plan Access Code: [facility]    Consulted and Agree with Plan of Care Patient             Patient will benefit from skilled therapeutic intervention in order to improve the following deficits and impairments:  Abnormal gait, Cardiopulmonary status limiting activity, Decreased activity tolerance, Decreased balance, Decreased coordination, Decreased endurance, Decreased mobility, Decreased range of motion, Decreased strength, Difficulty walking, Hypomobility, Pain  Visit Diagnosis: Difficulty in walking, not elsewhere classified  Muscle weakness (generalized)     Problem List Patient Active Problem List   Diagnosis Date Noted   COVID-19 long hauler manifesting chronic concentration deficit 04/05/2020   Hip pain 03/29/2020   Pulmonary embolism ([place]) 12/28/2019   Postural dizziness with presyncope 12/03/2019   History of COVID-19 11/03/2019   Shortness of breath 11/03/2019   Acute respiratory failure with hypoxia (HCC) 11/03/2019   Generalized anxiety disorder 02/23/2019   Sunburn 08/21/2018   Chronic pain of right knee 03/27/2017   Transient adjustment reaction with anxiety 05/02/2015   Edema 01/19/2013   OSA (obstructive sleep apnea) 09/15/2012   Obesity, morbid, BMI 50 or higher ([place]) 11/12/2011   Diabetes mellitus with renal complications ([place]) 44/03/4740   POLYCYSTIC OVARIES 07/08/2009   DEPRESSION, MAJOR, RECURRENT, MODERATE 07/08/2009   Bradly Chris PT, DPT  07/28/2020, 8:42 AM  [place] [facility] [address], [place],  [address] Phone: [phone]   Fax:  [phone]  Name: Kristy Conrad MRN: [id number] Date of Birth: [birth year]

## 2020-07-29 LAB — HM PAP SMEAR: HPV, high-risk: NEGATIVE

## 2020-08-02 ENCOUNTER — Telehealth: Payer: BC Managed Care – PPO | Admitting: Cardiovascular Disease

## 2020-08-02 ENCOUNTER — Other Ambulatory Visit: Payer: Self-pay

## 2020-08-02 ENCOUNTER — Ambulatory Visit: Payer: BC Managed Care – PPO | Admitting: Family Medicine

## 2020-08-02 ENCOUNTER — Encounter: Payer: Self-pay | Admitting: Family Medicine

## 2020-08-02 VITALS — BP 128/84 | HR 86 | Temp 97.8°F | Ht 64.0 in | Wt 343.0 lb

## 2020-08-02 DIAGNOSIS — S86012S Strain of left Achilles tendon, sequela: Secondary | ICD-10-CM | POA: Diagnosis not present

## 2020-08-02 DIAGNOSIS — L602 Onychogryphosis: Secondary | ICD-10-CM | POA: Diagnosis not present

## 2020-08-02 DIAGNOSIS — M25561 Pain in right knee: Secondary | ICD-10-CM | POA: Diagnosis not present

## 2020-08-02 DIAGNOSIS — G8929 Other chronic pain: Secondary | ICD-10-CM

## 2020-08-02 DIAGNOSIS — M25562 Pain in left knee: Secondary | ICD-10-CM

## 2020-08-02 DIAGNOSIS — E0822 Diabetes mellitus due to underlying condition with diabetic chronic kidney disease: Secondary | ICD-10-CM | POA: Diagnosis not present

## 2020-08-02 LAB — POCT GLYCOSYLATED HEMOGLOBIN (HGB A1C): Hemoglobin A1C: 10.1 % — AB (ref 4.0–5.6)

## 2020-08-02 MED ORDER — GLIPIZIDE 5 MG PO TABS: 5.0000 mg | ORAL_TABLET | Freq: Two times a day (BID) | ORAL | 3 refills | Status: DC

## 2020-08-02 MED ORDER — METFORMIN HCL 1000 MG PO TABS: 1000.0000 mg | ORAL_TABLET | Freq: Two times a day (BID) | ORAL | 3 refills | Status: DC

## 2020-08-02 MED ORDER — OZEMPIC (0.25 OR 0.5 MG/DOSE) 2 MG/1.5ML ~~LOC~~ SOPN: 0.2500 mg | PEN_INJECTOR | SUBCUTANEOUS | 1 refills | Status: DC

## 2020-08-02 NOTE — Telephone Encounter (Signed)
Referral to see Dr. Mayford Knife. Patient has OSA but not using a CPAP. Do we need to place order for sleep study prior to appt?

## 2020-08-02 NOTE — Progress Notes (Signed)
Subjective:     Kristy Conrad is a 45 y.o. female presenting for Diabetes (3 mo f/u/)     HPI  #Diabetes Currently taking metformin - has not been taking glipizide x 1 month  Using medications without difficulties: Yes Hypoglycemic episodes: does not check sugar Hyperglycemic episodes: does not check sugar Feet problems:No  Blood Sugars averaging: does not check Last HgbA1c:  Lab Results  Component Value Date   HGBA1C 10.1 (A) 08/02/2020     Diabetes Health Maintenance Due:    Diabetes Health Maintenance Due  Topic Date Due   FOOT EXAM  06/15/2020   OPHTHALMOLOGY EXAM  12/21/2020   HEMOGLOBIN A1C  02/02/2021    Has been working with PT - was discharged from old [facility] Has knee arthritis and achilles tendonopathy - her generally walking has improved  Review of Systems   Social History   Tobacco Use  Smoking Status Never  Smokeless Tobacco Never        Objective:    BP Readings from Last 3 Encounters:  08/02/20 128/84  07/12/20 122/80  05/23/20 102/79   Wt Readings from Last 3 Encounters:  08/02/20 (!) 343 lb (155.6 kg)  07/12/20 (!) 343 lb (155.6 kg)  05/05/20 (!) 348 lb 12 oz (158.2 kg)    BP 128/84   Pulse 86   Temp 97.8 F (36.6 C) (Temporal)   Ht 5\' 4"  (1.626 m)   Wt (!) 343 lb (155.6 kg)   SpO2 97%   BMI 58.88 kg/m    Physical Exam Constitutional:      General: She is not in acute distress.    Appearance: She is well-developed. She is not diaphoretic.  HENT:     Right Ear: External ear normal.     Left Ear: External ear normal.     Nose: Nose normal.  Eyes:     Conjunctiva/sclera: Conjunctivae normal.  Cardiovascular:     Rate and Rhythm: Normal rate and regular rhythm.     Heart sounds: No murmur heard. Pulmonary:     Effort: Pulmonary effort is normal. No respiratory distress.     Breath sounds: Normal breath sounds. No wheezing.  Musculoskeletal:     Cervical back: Neck supple.  Feet:     Comments: Right 5th digit  with thickened yellow nail raised from the nail bed with underlying clear drainage.  Skin:    General: Skin is warm and dry.     Capillary Refill: Capillary refill takes less than 2 seconds.  Neurological:     Mental Status: She is alert. Mental status is at baseline.  Psychiatric:        Mood and Affect: Mood normal.        Behavior: Behavior normal.          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 Restart glipizide. Cont metformin. Start ozempic - if cost issue will plan pharmacy consult to find affordable medication. Return 3 months, check in monthly for dose increase       Relevant Medications   Semaglutide,0.25 or 0.5MG /DOS, (OZEMPIC, 0.25 OR 0.5 MG/DOSE,) 2 MG/1.5ML SOPN   metFORMIN (GLUCOPHAGE) 1000 MG tablet   glipiZIDE (GLUCOTROL) 5 MG tablet   Other Relevant Orders  POCT glycosylated hemoglobin (Hb A1C) (Completed)     Other   Chronic pain of both knees    Referral to PT to work on strengthening.        Relevant Orders   Ambulatory referral to Physical Therapy   Thickened nail    Nail clippers to shorten nail. Mild erythema and clear drainage. Discussed infectious signs and symptoms and will have low threshold for abx given diabetes.        Other Visit Diagnoses     Achilles tendon sprain, left, sequela       Relevant Orders   Ambulatory referral to Physical Therapy        Return in about 3 months (around 11/02/2020).  Lynnda Child, MD  This visit occurred during the SARS-CoV-2 public health emergency.  Safety protocols were in place, including screening questions prior to the visit, additional usage of staff PPE, and extensive cleaning of exam room while observing appropriate contact time as indicated for disinfecting solutions.

## 2020-08-02 NOTE — Patient Instructions (Addendum)
#  Diabetes - Ozempic - call if too expensive - start with 0.25 mg weekly - send mychart in 3-4 weeks after starting and if no side effects then will increase the dose

## 2020-08-02 NOTE — Assessment & Plan Note (Signed)
Lab Results  Component Value Date   HGBA1C 10.1 (A) 08/02/2020  Worse in setting of forgetting glipizide. Restart glipizide. Cont metformin. Start ozempic - if cost issue will plan pharmacy consult to find affordable medication. Return 3 months, check in monthly for dose increase

## 2020-08-02 NOTE — Assessment & Plan Note (Signed)
Referral to PT to work on strengthening.

## 2020-08-02 NOTE — Assessment & Plan Note (Signed)
Nail clippers to shorten nail. Mild erythema and clear drainage. Discussed infectious signs and symptoms and will have low threshold for abx given diabetes.

## 2020-08-03 ENCOUNTER — Encounter: Payer: Self-pay | Admitting: Family Medicine

## 2020-08-23 ENCOUNTER — Other Ambulatory Visit: Payer: Self-pay | Admitting: Family Medicine

## 2020-08-23 DIAGNOSIS — E0822 Diabetes mellitus due to underlying condition with diabetic chronic kidney disease: Secondary | ICD-10-CM

## 2020-08-24 ENCOUNTER — Encounter: Payer: Self-pay | Admitting: Family Medicine

## 2020-08-30 ENCOUNTER — Other Ambulatory Visit: Payer: Self-pay | Admitting: Family Medicine

## 2020-09-09 NOTE — Telephone Encounter (Signed)
See below. You want to order a sleep study?  Thanks!

## 2020-09-09 NOTE — Telephone Encounter (Signed)
Called and left message for patient to call back to find out how long it has been since she has used her CPAP.  Dr. Flora Lipps placed referral to Dr. Mayford Knife for OSA. If patient has not been using her CPAP for greater than 3 months, then the patient will need to have a sleep study ordered by referring MD before an appointment can be scheduled with Dr. Mayford Knife.

## 2020-09-12 ENCOUNTER — Other Ambulatory Visit: Payer: Self-pay

## 2020-09-12 DIAGNOSIS — G4733 Obstructive sleep apnea (adult) (pediatric): Secondary | ICD-10-CM

## 2020-09-12 NOTE — Telephone Encounter (Signed)
Home sleep study ordered.  I will send a message to our sleep coordinator to make her aware.

## 2020-09-21 ENCOUNTER — Other Ambulatory Visit: Payer: Self-pay | Admitting: Family Medicine

## 2020-09-21 DIAGNOSIS — F411 Generalized anxiety disorder: Secondary | ICD-10-CM

## 2020-09-21 DIAGNOSIS — F331 Major depressive disorder, recurrent, moderate: Secondary | ICD-10-CM

## 2020-09-26 ENCOUNTER — Other Ambulatory Visit: Payer: Self-pay | Admitting: Family Medicine

## 2020-09-26 DIAGNOSIS — M25561 Pain in right knee: Secondary | ICD-10-CM

## 2020-09-26 DIAGNOSIS — E0822 Diabetes mellitus due to underlying condition with diabetic chronic kidney disease: Secondary | ICD-10-CM

## 2020-09-26 DIAGNOSIS — G8929 Other chronic pain: Secondary | ICD-10-CM

## 2020-09-26 NOTE — Telephone Encounter (Signed)
Prior Authorization for Home Sleep Study sent to [facility] via web portal. No PA required. Please schedule patient.

## 2020-09-29 ENCOUNTER — Telehealth: Payer: Self-pay

## 2020-09-29 NOTE — Telephone Encounter (Signed)
[facility] - Client TELEPHONE ADVICE RECORD Access[facility]urse Patient Name: Kristy Conrad Gender: Female DOB: May 29, 1975 Age: 45 Y 6 M 26 D Return Phone Number: [phone] (Primary) Address: City/ State/ Zip: [place] [address] Client [facility] - Client Client Site [facility] - Day Physician Gweneth Dimitri- MD Contact Type Call Who Is Calling Patient / Member / Family / Caregiver Call Type Triage / Clinical Relationship To Patient Self Return Phone Number [phone] (Primary) Chief Complaint Cough Reason for Call Symptomatic / Request for Health Information Initial Comment Caller states that she has wheezing, shortness of breath, and coughing. Translation No Nurse Assessment Nurse: Charna Elizabeth, RN, Cathy Date/Time (Eastern Time): 09/29/2020 10:33:45 AM Confirm and document reason for call. If symptomatic, describe symptoms. ---Caller states she developed cold and cough symptoms about 6 days ago. She developed wheezing with shortness of breath yesterday. No fever. Alert and responsive. (Negative COVID test about 5 days ago.) Does the patient have any new or worsening symptoms? ---Yes Will a triage be completed? ---Yes Related visit to physician within the last 2 weeks? ---Yes Does the PT have any chronic conditions? (i.e. diabetes, asthma, this includes High risk factors for pregnancy, etc.) ---Yes List chronic conditions. ---[place] COVID, PE, Arthritis Is the patient pregnant or possibly pregnant? (Ask all females between the ages of 78-55) ---No Is this a behavioral health or substance abuse call? ---No Guidelines Guideline Title Affirmed Question Affirmed Notes Nurse Date/Time (Eastern Time) COVID-19 - Diagnosed or Suspected MODERATE difficulty breathing (e.g., speaks in phrases, SOB even at rest, pulse 100-120) [place], RN, Lynden Ang 09/29/2020 10:36:34 AM PLEASE NOTE: All  timestamps contained within this report are represented as [place] Standard Time. CONFIDENTIALTY NOTICE: This fax transmission is intended only for the addressee. It contains information that is legally privileged, confidential or otherwise protected from use or disclosure. If you are not the intended recipient, you are strictly prohibited from reviewing, disclosing, copying using or disseminating any of this information or taking any action in reliance on or regarding this information. If you have received this fax in error, please notify us immediately by telephone so that we can arrange for its return to [place]. Phone: [phone], Toll-Free: [phone], Fax: [phone] Page: 2 of 2 Call Id: [id number] Disp. Time Lamount Cohen Time) Disposition Final User 09/29/2020 10:30:40 AM Send to Urgent Queue Brooke Pace 09/29/2020 10:40:08 AM Go to ED Now Yes Charna Elizabeth, RN, Frann Rider Disagree/Comply Disagree Caller Understands Yes PreDisposition Go to ED Care Advice Given Per Guideline GO TO ED NOW: * You need to be seen in the Emergency Department. * Go to the ED at ___________ Hospital. * Leave now. Drive carefully. REASSURANCE AND EDUCATION - GOING TO THE ED OR URGENT CARE CENTER DURING THE COVID-19 PANDEMIC: * If you or your child needs to be seen for an urgent medical problem, do not hesitate to go. * Emergency Departments and urgent care centers are safe places. They are well equipped to protect you against the virus. * For non-urgent conditions, talk to your doctor (or NP/PA) first. WEAR A MASK - COVER YOUR MOUTH AND NOSE: * Wear a mask that fits snuggly over your mouth and nose. * If you do not have a mask, then cover your mouth and nose with a disposable tissue (e.g., Kleenex, toilet paper, paper towel) or wash cloth. ANOTHER ADULT SHOULD DRIVE: * It is better and safer if another adult drives instead of you. * Confusion occurs. *  Lips or face turns blue * Severe difficulty breathing occurs CALL  EMS 911 IF: CARE ADVICE given per COVID-19 - DIAGNOSED OR SUSPECTED (Adult) guideline. Comments User: Colonel Bald, RN Date/Time Lamount Cohen Time): 09/29/2020 10:40:48 AM She plans to go to urgent care due to expense of ER. Referrals GO TO FACILITY REFUSED

## 2020-09-29 NOTE — Telephone Encounter (Signed)
Agree with UC for evaluation to start. Appreciate reaching out to patient to verify.

## 2020-09-29 NOTE — Telephone Encounter (Signed)
Per DPR left detailed v/m requesting pt to cb; making sure per access note that pt had agreed to go to UC instead of ED. Sending note to Dr Selena Batten and [facility] CMA. Sending teams to [facility].

## 2020-10-05 NOTE — Telephone Encounter (Addendum)
Informed patient of upcoming home sleep study and patient understanding was verbalized.  Patient understands her/his HST is scheduled for 11/02/20 at 12p. Pt is aware of testing date.

## 2020-10-19 NOTE — Telephone Encounter (Signed)
Patient called to cancel her HST and stated she has not seen a doctor about sleep apnea and she had no knowledge of being referred for a sleep study.

## 2020-11-02 ENCOUNTER — Ambulatory Visit (HOSPITAL_BASED_OUTPATIENT_CLINIC_OR_DEPARTMENT_OTHER): Payer: BC Managed Care – PPO | Admitting: Cardiology

## 2020-11-17 ENCOUNTER — Other Ambulatory Visit: Payer: Self-pay | Admitting: Family Medicine

## 2020-11-17 DIAGNOSIS — E0822 Diabetes mellitus due to underlying condition with diabetic chronic kidney disease: Secondary | ICD-10-CM

## 2020-11-18 NOTE — Telephone Encounter (Signed)
Called patient and left vm to make appt

## 2020-11-21 NOTE — Telephone Encounter (Signed)
Called patient and left vm.

## 2020-11-24 ENCOUNTER — Other Ambulatory Visit: Payer: Self-pay | Admitting: Cardiovascular Disease

## 2020-11-24 ENCOUNTER — Other Ambulatory Visit: Payer: Self-pay | Admitting: Family Medicine

## 2020-11-24 DIAGNOSIS — G8929 Other chronic pain: Secondary | ICD-10-CM

## 2020-11-24 DIAGNOSIS — M25561 Pain in right knee: Secondary | ICD-10-CM

## 2020-11-24 DIAGNOSIS — E782 Mixed hyperlipidemia: Secondary | ICD-10-CM

## 2020-11-29 NOTE — Telephone Encounter (Signed)
LOV 08/02/20 for diabetes follow up No future appointments made.  Last refilled: Voltaren 09/28/20 #60 with 1 refill   Albuterol inhaler 08/31/20 6.7 each with 2 refills

## 2020-11-29 NOTE — Telephone Encounter (Signed)
How often is she using her albuterol inhaler?  It looks like she's requesting a refill once monthly which is inappropriate.   Is she also on Eliquis? By taking the diclofenac she is at increased risk for GI bleeding.

## 2020-12-06 NOTE — Telephone Encounter (Signed)
My chart to pt

## 2020-12-13 ENCOUNTER — Telehealth: Payer: Self-pay

## 2020-12-13 NOTE — Telephone Encounter (Signed)
Letter has been sent to patient instructing them to call us if they are still interested in completing their sleep study. If we have not received a response from the patient within 30 days of this notice, the order will be cancelled and they will need to discuss the need for a sleep study at their next office visit.  ° °

## 2020-12-20 ENCOUNTER — Other Ambulatory Visit: Payer: Self-pay | Admitting: Family Medicine

## 2020-12-20 DIAGNOSIS — F331 Major depressive disorder, recurrent, moderate: Secondary | ICD-10-CM

## 2020-12-20 DIAGNOSIS — F411 Generalized anxiety disorder: Secondary | ICD-10-CM

## 2020-12-22 ENCOUNTER — Telehealth: Payer: Self-pay | Admitting: Family Medicine

## 2020-12-22 NOTE — Telephone Encounter (Signed)
°  Encourage patient to contact the pharmacy for refills or they can request refills through [facility]  LAST APPOINTMENT DATE:  Please schedule appointment if longer than 1 year  NEXT APPOINTMENT DATE:12/27/20  MEDICATION:FLUoxetine (PROZAC) 20 MG capsule  Is the patient out of medication?   PHARMACY:[id number] [place], [place] - [address]  Let patient know to contact pharmacy at the end of the day to make sure medication is ready.  Please notify patient to allow 48-72 hours to process  CLINICAL FILLS OUT ALL BELOW:   LAST REFILL:  QTY:  REFILL DATE:    OTHER COMMENTS:    Okay for refill?  Please advise

## 2020-12-22 NOTE — Telephone Encounter (Signed)
Refill sent in

## 2020-12-26 NOTE — Progress Notes (Deleted)
° ° °  Providencia Hottenstein T. Chi Garlow, MD, CAQ Sports Medicine [facility] [address] [place], [address]  Phone: [phone]   FAX: [phone]  METHA Conrad - 45 y.o. female   MRN [id number]   Date of Birth: 29-Dec-1975  Date: 12/27/2020   PCP: Lynnda Child, MD   Referral: Lynnda Child, MD  No chief complaint on file.   This visit occurred during the SARS-CoV-2 public health emergency.  Safety protocols were in place, including screening questions prior to the visit, additional usage of staff PPE, and extensive cleaning of exam room while observing appropriate contact time as indicated for disinfecting solutions.   Subjective:   Kristy Conrad is a 45 y.o. very pleasant female patient with There is no height or weight on file to calculate BMI. who presents with the following:  I am seeing the patient today who is new to me for a chronic medical follow-up in the absence of my partner Dr. Selena Batten.  She is here for follow-up on poorly controlled diabetes.    She was recently started on Ozempic.  Her initial dose was at 0.25 mg per dose once weekly. Metformin 1000 mg p.o. twice daily Glipizide 5 mg p.o. twice daily  On chart review, the patient also is on Eliquis, she was diagnosed with a pulmonary embolism in December 2021.  Per the cardiology notes, she did not complete therapy for her pulmonary embolism, and this was restarted.  Is currently on her medication list, Eliquis?  On chart review, patient also appears to have had COVID-19 on October 13, 2019  Review of Systems is noted in the HPI, as appropriate  Objective:   There were no vitals taken for this visit.  GEN: No acute distress; alert,appropriate. PULM: Breathing comfortably in no respiratory distress PSYCH: Normally interactive.   Laboratory and Imaging Data:  Assessment and Plan:   ***

## 2020-12-27 ENCOUNTER — Ambulatory Visit: Payer: BC Managed Care – PPO | Admitting: Family Medicine

## 2020-12-27 ENCOUNTER — Other Ambulatory Visit: Payer: Self-pay | Admitting: Primary Care

## 2020-12-27 DIAGNOSIS — M25561 Pain in right knee: Secondary | ICD-10-CM

## 2020-12-29 ENCOUNTER — Ambulatory Visit: Payer: BC Managed Care – PPO | Admitting: Family Medicine

## 2021-01-04 ENCOUNTER — Ambulatory Visit: Payer: BC Managed Care – PPO | Admitting: Family Medicine

## 2021-01-04 ENCOUNTER — Other Ambulatory Visit: Payer: Self-pay

## 2021-01-04 ENCOUNTER — Encounter: Payer: Self-pay | Admitting: Family Medicine

## 2021-01-04 VITALS — BP 106/76 | HR 88 | Temp 97.1°F | Ht 63.0 in | Wt 340.2 lb

## 2021-01-04 DIAGNOSIS — E0822 Diabetes mellitus due to underlying condition with diabetic chronic kidney disease: Secondary | ICD-10-CM

## 2021-01-04 DIAGNOSIS — N189 Chronic kidney disease, unspecified: Secondary | ICD-10-CM

## 2021-01-04 DIAGNOSIS — Z23 Encounter for immunization: Secondary | ICD-10-CM

## 2021-01-04 LAB — POCT GLYCOSYLATED HEMOGLOBIN (HGB A1C): Hemoglobin A1C: 10.7 % — AB (ref 4.0–5.6)

## 2021-01-04 MED ORDER — GLIPIZIDE 10 MG PO TABS: 10.0000 mg | ORAL_TABLET | Freq: Two times a day (BID) | ORAL | 1 refills | Status: DC

## 2021-01-04 NOTE — Patient Instructions (Signed)
Which Viscosupplementation (also called Hyaluronic Acid) injections does my insurance cover?  What is my financial responsibility.  Examples of this medication would be Monovisc, Synvisc, Duralane, Euflexxa, and a lot of others.

## 2021-01-04 NOTE — Progress Notes (Signed)
Kristy Sandoval T. Aeryn Medici, MD, CAQ Sports Medicine [facility] at [facility] [address] [place], [address]  Phone: [phone]   FAX: [phone]  Kristy Conrad - 46 y.o. female   MRN [id number]   Date of Birth: 07-Dec-1975  Date: 01/04/2021   PCP: Lynnda Child, MD   Referral: Lynnda Child, MD  Chief Complaint  Patient presents with   Diabetes    This visit occurred during the SARS-CoV-2 public health emergency.  Safety protocols were in place, including screening questions prior to the visit, additional usage of staff PPE, and extensive cleaning of exam room while observing appropriate contact time as indicated for disinfecting solutions.   Subjective:   Kristy Conrad is a 46 y.o. very pleasant female patient with Body mass index is 60.27 kg/m. who presents with the following:  Diabetes Mellitus: Tolerating Medications: yes Compliance with diet: fair, Body mass index is 60.27 kg/m. Exercise: minimal / intermittent Avg blood sugars at home: not checking Foot problems: none Hypoglycemia: none No nausea, vomitting, blurred vision, polyuria.  Lab Results  Component Value Date   HGBA1C 10.7 (A) 01/04/2021   HGBA1C 10.1 (A) 08/02/2020   HGBA1C 8.7 (A) 03/29/2020   Lab Results  Component Value Date   MICROALBUR 1.9 03/31/2014   LDLCALC 70 01/06/2019   CREATININE 0.59 12/11/2019    Wt Readings from Last 3 Encounters:  01/04/21 (!) 340 lb 4 oz (154.3 kg)  08/02/20 (!) 343 lb (155.6 kg)  07/12/20 (!) 343 lb (155.6 kg)    HTN: Tolerating all medications without side effects Stable and at goal No CP, no sob. No HA.  BP Readings from Last 3 Encounters:  01/04/21 106/76  08/02/20 128/84  07/12/20 122/80    Basic Metabolic Panel:    Component Value Date/Time   NA 134 12/11/2019 1143   K 4.7 12/11/2019 1143   CL 98 12/11/2019 1143   CO2 25 12/11/2019 1143   BUN 11 12/11/2019 1143   CREATININE 0.59 12/11/2019 1143   GLUCOSE 371  (H) 12/11/2019 1143   GLUCOSE 194 (H) 09/03/2017 1304   CALCIUM 9.1 12/11/2019 1143     Review of Systems is noted in the HPI, as appropriate  Objective:   BP 106/76    Pulse 88    Temp (!) 97.1 F (36.2 C) (Temporal)    Ht 5\' 3"  (1.6 m)    Wt (!) 340 lb 4 oz (154.3 kg)    SpO2 96%    BMI 60.27 kg/m   GEN: No acute distress; alert,appropriate. PULM: Breathing comfortably in no respiratory distress PSYCH: Normally interactive.  CV: RRR, no m/g/r   Laboratory and Imaging Data: Results for orders placed or performed in visit on 01/04/21  POCT glycosylated hemoglobin (Hb A1C)  Result Value Ref Range   Hemoglobin A1C 10.7 (A) 4.0 - 5.6 %   HbA1c POC (<> result, manual entry)     HbA1c, POC (prediabetic range)     HbA1c, POC (controlled diabetic range)       Assessment and Plan:     ICD-10-CM   1. 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 Need for influenza vaccination  Z23 Flu Vaccine QUAD 6+ mos PF IM (Fluarix Quad PF)     Start Ozempic With BS /  a1c so high, add in Glipizide.  Will need close f/u.  Meds ordered this encounter  Medications   glipiZIDE (GLUCOTROL) 10 MG tablet    Sig: Take 1 tablet (10 mg total) by mouth 2 (two) times daily before a meal.    Dispense:  180 tablet    Refill:  1   Medications Discontinued During This Encounter  Medication Reason   ELIQUIS 5 MG TABS tablet Completed Course   glipiZIDE (GLUCOTROL) 5 MG tablet    Orders Placed This Encounter  Procedures   Flu Vaccine QUAD 6+ mos PF IM (Fluarix Quad PF)   POCT glycosylated hemoglobin (Hb A1C)    Follow-up: 3 mo, Dr. Orpah Melter [employer] speech-to-text software was used for transcription in this dictation.  Possible transcriptional errors can occur using Editor, commissioning.    Signed,  Kristy Deed. Tashawn Greff, MD   Outpatient Encounter Medications as of 01/04/2021  Medication Sig   albuterol (VENTOLIN HFA) 108 (90 Base) MCG/ACT inhaler TAKE 2 PUFFS BY MOUTH EVERY 6 HOURS AS NEEDED FOR WHEEZE OR SHORTNESS OF BREATH   cetirizine (ZYRTEC) 10 MG tablet Take 10 mg by mouth daily.   Cholecalciferol (VITAMIN D3) 50 MCG (2000 UT) TABS Take 50 mcg by mouth daily.   COLLAGEN PO Take by mouth.   diclofenac (VOLTAREN) 75 MG EC tablet TAKE 1 TABLET (75 MG TOTAL) BY MOUTH 2 (TWO) TIMES DAILY AS NEEDED FOR MODERATE PAIN.   FLUoxetine (PROZAC) 20 MG capsule TAKE 1 CAPSULE (20 MG TOTAL) BY MOUTH DAILY. APPT WITH PCP NEEDED FOR FURTHER REFILLS.   fluticasone furoate-vilanterol (BREO ELLIPTA) 200-25 MCG/INH AEPB Inhale 1 puff into the lungs daily.   folic acid (FOLVITE) 1 MG tablet Take 1 mg by mouth daily.   Glucosamine HCl-MSM (GLUCOSAMINE-MSM PO) Take 1,500 mg by mouth daily.   lisinopril (ZESTRIL) 5 MG tablet TAKE 1 TABLET BY MOUTH EVERY DAY   metFORMIN (GLUCOPHAGE) 1000 MG tablet Take 1 tablet (1,000 mg total) by mouth 2 (two) times daily with a meal.   rosuvastatin (CRESTOR) 10 MG tablet TAKE 1 TABLET BY MOUTH EVERY DAY   Turmeric Curcumin 500 MG CAPS Take 3 capsules by mouth daily.    [DISCONTINUED] glipiZIDE (GLUCOTROL) 5 MG tablet Take 1 tablet (5 mg total) by mouth 2 (two) times daily before a meal.   glipiZIDE (GLUCOTROL) 10 MG tablet Take 1 tablet (10 mg total) by mouth 2 (two) times daily before a meal.   OZEMPIC, 0.25 OR 0.5 MG/DOSE, 2 MG/1.5ML SOPN INJECT 0.25MG  INTO THE SKIN ONE TIME PER WEEK (Patient not taking: Reported on 01/04/2021)   [DISCONTINUED] ELIQUIS 5 MG TABS tablet Take 5 mg by mouth 2 (two) times daily.   No facility-administered encounter medications on file as of 01/04/2021.

## 2021-02-21 ENCOUNTER — Other Ambulatory Visit: Payer: Self-pay | Admitting: Primary Care

## 2021-02-21 DIAGNOSIS — G8929 Other chronic pain: Secondary | ICD-10-CM

## 2021-02-21 DIAGNOSIS — M25561 Pain in right knee: Secondary | ICD-10-CM

## 2021-03-22 ENCOUNTER — Other Ambulatory Visit: Payer: Self-pay | Admitting: Family Medicine

## 2021-03-22 DIAGNOSIS — F331 Major depressive disorder, recurrent, moderate: Secondary | ICD-10-CM

## 2021-03-22 DIAGNOSIS — F411 Generalized anxiety disorder: Secondary | ICD-10-CM

## 2021-03-25 ENCOUNTER — Other Ambulatory Visit: Payer: Self-pay | Admitting: Family Medicine

## 2021-03-25 DIAGNOSIS — G8929 Other chronic pain: Secondary | ICD-10-CM

## 2021-03-29 LAB — HM DIABETES EYE EXAM

## 2021-04-08 ENCOUNTER — Other Ambulatory Visit: Payer: Self-pay | Admitting: Family Medicine

## 2021-04-08 DIAGNOSIS — F411 Generalized anxiety disorder: Secondary | ICD-10-CM

## 2021-04-08 DIAGNOSIS — F331 Major depressive disorder, recurrent, moderate: Secondary | ICD-10-CM

## 2021-04-10 NOTE — Telephone Encounter (Signed)
Is this okay to refill ? ? ?Last Filled 12/22/20 ?Last OV 01/04/21 ?Next OV  none  ? ? ? ?

## 2021-04-23 ENCOUNTER — Other Ambulatory Visit: Payer: Self-pay | Admitting: Family Medicine

## 2021-04-23 DIAGNOSIS — G8929 Other chronic pain: Secondary | ICD-10-CM

## 2021-05-24 ENCOUNTER — Other Ambulatory Visit: Payer: Self-pay | Admitting: Family Medicine

## 2021-05-24 DIAGNOSIS — G8929 Other chronic pain: Secondary | ICD-10-CM

## 2021-05-26 ENCOUNTER — Other Ambulatory Visit: Payer: Self-pay | Admitting: Family Medicine

## 2021-05-26 ENCOUNTER — Telehealth: Payer: Self-pay | Admitting: Family Medicine

## 2021-05-26 DIAGNOSIS — G8929 Other chronic pain: Secondary | ICD-10-CM

## 2021-05-26 NOTE — Telephone Encounter (Signed)
Pt called and wanted to know if Dr. Einar Pheasant has accepted this medication to get refilled, she stated she made an appointment with her on 06/15/2021 for a f/u. Would like to speak with the nurse, please return a call back when possible.  Callback Number: [phone]

## 2021-06-08 ENCOUNTER — Other Ambulatory Visit: Payer: Self-pay | Admitting: Family Medicine

## 2021-06-08 DIAGNOSIS — G8929 Other chronic pain: Secondary | ICD-10-CM

## 2021-06-15 ENCOUNTER — Encounter: Payer: Self-pay | Admitting: Family Medicine

## 2021-06-15 ENCOUNTER — Ambulatory Visit: Payer: BC Managed Care – PPO | Admitting: Family Medicine

## 2021-06-15 VITALS — BP 124/74 | HR 88 | Temp 98.6°F | Resp 16 | Ht 63.0 in | Wt 344.1 lb

## 2021-06-15 DIAGNOSIS — E0822 Diabetes mellitus due to underlying condition with diabetic chronic kidney disease: Secondary | ICD-10-CM

## 2021-06-15 DIAGNOSIS — Z6841 Body Mass Index (BMI) 40.0 and over, adult: Secondary | ICD-10-CM | POA: Diagnosis not present

## 2021-06-15 DIAGNOSIS — Z7984 Long term (current) use of oral hypoglycemic drugs: Secondary | ICD-10-CM

## 2021-06-15 DIAGNOSIS — E1121 Type 2 diabetes mellitus with diabetic nephropathy: Secondary | ICD-10-CM

## 2021-06-15 DIAGNOSIS — E1129 Type 2 diabetes mellitus with other diabetic kidney complication: Secondary | ICD-10-CM | POA: Diagnosis not present

## 2021-06-15 LAB — COMPREHENSIVE METABOLIC PANEL
ALT: 29 U/L (ref 0–35)
AST: 28 U/L (ref 0–37)
Albumin: 3.8 g/dL (ref 3.5–5.2)
Alkaline Phosphatase: 48 U/L (ref 39–117)
BUN: 12 mg/dL (ref 6–23)
CO2: 28 mEq/L (ref 19–32)
Calcium: 9.5 mg/dL (ref 8.4–10.5)
Chloride: 100 mEq/L (ref 96–112)
Creatinine, Ser: 0.74 mg/dL (ref 0.40–1.20)
GFR: 96.44 mL/min (ref >60.00)
Glucose, Bld: 235 mg/dL — ABNORMAL HIGH (ref 70–99)
Potassium: 4.3 mEq/L (ref 3.5–5.1)
Sodium: 136 mEq/L (ref 135–145)
Total Bilirubin: 0.5 mg/dL (ref 0.2–1.2)
Total Protein: 7.2 g/dL (ref 6.0–8.3)

## 2021-06-15 LAB — CBC
HCT: 38.3 % (ref 36.0–46.0)
Hemoglobin: 12.6 g/dL (ref 12.0–15.0)
MCHC: 32.8 g/dL (ref 30.0–36.0)
MCV: 86.1 fl (ref 78.0–100.0)
Platelets: 309 10*3/uL (ref 150.0–400.0)
RBC: 4.45 Mil/uL (ref 3.87–5.11)
RDW: 14.8 % (ref 11.5–15.5)
WBC: 6.8 10*3/uL (ref 4.0–10.5)

## 2021-06-15 LAB — POCT GLYCOSYLATED HEMOGLOBIN (HGB A1C): Hemoglobin A1C: 9 % — AB (ref 4.0–5.6)

## 2021-06-15 LAB — LIPID PANEL
Cholesterol: 95 mg/dL (ref 0–200)
HDL: 34.3 mg/dL — ABNORMAL LOW (ref >39.00)
LDL Cholesterol: 41 mg/dL (ref 0–99)
NonHDL: 60.8
Total CHOL/HDL Ratio: 3
Triglycerides: 98 mg/dL (ref 0.0–149.0)
VLDL: 19.6 mg/dL (ref 0.0–40.0)

## 2021-06-15 NOTE — Assessment & Plan Note (Signed)
Lab Results  Component Value Date   HGBA1C 9.0 (A) 06/15/2021   Some improvement since January.  Continue metformin 1000 mg twice daily, glipizide 10 mg twice daily.  Strongly encouraged trying Ozempic 0.25 mg weekly.  Update in 1 month, consider dose increase.  We will also look into getting patient continuous glucose monitor to see if that helps her follow diabetic diet better.

## 2021-06-15 NOTE — Progress Notes (Signed)
Subjective:     Kristy Conrad is a 46 y.o. 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   Diabetes Health Maintenance Due:    Diabetes Health Maintenance Due  Topic Date Due   OPHTHALMOLOGY EXAM  12/21/2020   HEMOGLOBIN A1C  12/15/2021   FOOT EXAM  06/16/2022      Review of Systems   Social History   Tobacco Use  Smoking Status Never  Smokeless Tobacco Never        Objective:    BP Readings from Last 3 Encounters:  06/15/21 124/74  01/04/21 106/76  08/02/20 128/84   Wt Readings from Last 3 Encounters:  06/15/21 (!) 344 lb 2 oz (156.1 kg)  01/04/21 (!) 340 lb 4 oz (154.3 kg)  08/02/20 (!) 343 lb (155.6 kg)    BP 124/74   Pulse 88   Temp 98.6 F (37 C)   Resp 16   Ht 5\' 3"  (1.6 m)   Wt (!) 344 lb 2 oz (156.1 kg)   SpO2 97%   BMI 60.96 kg/m   Physical Exam Constitutional:      General: She is not in acute distress.    Appearance: She is well-developed. She is not diaphoretic.  HENT:     Right Ear: External ear normal.     Left Ear: External ear normal.     Nose: Nose normal.  Eyes:     Conjunctiva/sclera: Conjunctivae normal.  Cardiovascular:     Rate and Rhythm: Normal rate and regular rhythm.  Pulmonary:     Effort: Pulmonary effort is normal.     Breath sounds: Normal breath sounds.  Musculoskeletal:     Cervical back: Neck supple.  Skin:    General: Skin is warm and dry.     Capillary Refill: Capillary refill takes less than  2 seconds.  Neurological:     Mental Status: She is alert. Mental status is at baseline.  Psychiatric:        Mood and Affect: Mood normal.        Behavior: Behavior normal.     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  Continue metformin 1000 mg twice daily, glipizide 10 mg twice daily.  Strongly encouraged trying Ozempic 0.25 mg weekly.  Update in 1 month, consider dose increase.  We will also look into getting  patient continuous glucose monitor to see if that helps her follow diabetic diet better.      Relevant Orders   POCT glycosylated hemoglobin (Hb A1C) (Completed)   Comprehensive metabolic panel   Lipid panel   CBC     Other   Obesity, morbid, BMI 50 or higher (HCC)    Work on [employer] and exercise.  Start Ozempic 0.25 mg weekly      Relevant Orders   Comprehensive metabolic panel   Lipid panel   CBC     Return in about 3 months (around 09/15/2021).  Lynnda Child, MD

## 2021-06-15 NOTE — Patient Instructions (Addendum)
Try walking for 15-20 minutes after a meal - this can help lower your glucose  I will check into the dexacom which one is covered and order  Start Ozempic 0.25 mg  After 3 weeks - send mychart message to let me know the following: 1) Are you tolerating? 2) Can you increase the dose?

## 2021-06-15 NOTE — Assessment & Plan Note (Signed)
Work on [employer] and exercise.  Start Ozempic 0.25 mg weekly

## 2021-06-16 ENCOUNTER — Telehealth: Payer: Self-pay | Admitting: Pharmacist

## 2021-06-16 DIAGNOSIS — E0822 Diabetes mellitus due to underlying condition with diabetic chronic kidney disease: Secondary | ICD-10-CM

## 2021-06-16 MED ORDER — DEXCOM G7 SENSOR MISC: 3 refills | Status: DC

## 2021-06-16 NOTE — Telephone Encounter (Signed)
Per [employer] formulary review Dexcom G7 should be covered but may require a PA. Submitted PA via covermymeds. KEY: [id number]

## 2021-06-19 ENCOUNTER — Other Ambulatory Visit: Payer: Self-pay | Admitting: Family Medicine

## 2021-06-19 NOTE — Telephone Encounter (Addendum)
Per [employer] CGM coverage has been denied because patient is not on an intensive insulin regimen:  "You do not meet the requirements of your plan. Your plan covers a continuous glucose monitor when you are using an intensive insulin regimen."  Patient can pay out of pocket if she wants - Freestyle Josephine Igo would be cheaper with a GoodRx coupon (about $70 per sensor).

## 2021-06-19 NOTE — Progress Notes (Signed)
Called patient to inform her due to not being on insulin her Freestyle Kristy Conrad was denied. Informed patient she can pay out of pocket. Freestyle Kristy Conrad would be cheaper with a GoodRx coupon (about $70 per sensor).   Al Corpus, CPP notified  Claudina Lick, [place] Clinical Pharmacy Assistant [phone]

## 2021-06-25 ENCOUNTER — Other Ambulatory Visit: Payer: Self-pay | Admitting: Family Medicine

## 2021-06-25 DIAGNOSIS — G8929 Other chronic pain: Secondary | ICD-10-CM

## 2021-06-28 NOTE — Telephone Encounter (Signed)
Advised through [employer].

## 2021-07-05 ENCOUNTER — Other Ambulatory Visit: Payer: Self-pay | Admitting: Family Medicine

## 2021-07-05 DIAGNOSIS — E0822 Diabetes mellitus due to underlying condition with diabetic chronic kidney disease: Secondary | ICD-10-CM

## 2021-07-13 ENCOUNTER — Telehealth: Payer: Self-pay

## 2021-07-13 DIAGNOSIS — G8929 Other chronic pain: Secondary | ICD-10-CM

## 2021-07-13 NOTE — Telephone Encounter (Signed)
Prior auth started for [employer]. Thresa Ross Key: [id number] - PA Case ID: [id number] - Rx #: [id number] Waiting for determination.

## 2021-07-14 NOTE — Telephone Encounter (Signed)
Prior auth for [employer] has been denied. Thresa Ross Key: [id number] - PA Case ID: [id number] - Rx #: [id number]  [facility]  received a request from your provider for coverage of Dexcom G7 Mis Sensor. The request was denied because: You do not meet the requirements of your plan.  Your plan covers a continuous glucose monitor when you are using an intensive insulin regimen (defined as three or more insulin injections per day).  Your request has been denied based on the information we have.  Denial letter sent to scanning.

## 2021-07-23 ENCOUNTER — Other Ambulatory Visit: Payer: Self-pay | Admitting: Family Medicine

## 2021-07-23 DIAGNOSIS — G8929 Other chronic pain: Secondary | ICD-10-CM

## 2021-07-25 MED ORDER — DICLOFENAC SODIUM 75 MG PO TBEC: 75.0000 mg | DELAYED_RELEASE_TABLET | Freq: Two times a day (BID) | ORAL | 2 refills | Status: DC | PRN

## 2021-07-25 NOTE — Addendum Note (Signed)
Addended by: Lynnda Child on: 07/25/2021 03:26 PM   Modules accepted: Orders

## 2021-07-25 NOTE — Telephone Encounter (Signed)
Reached out to patient. Patient stated she was just seen a few weeks ago.

## 2021-07-28 ENCOUNTER — Encounter: Payer: Self-pay | Admitting: Family Medicine

## 2021-09-01 ENCOUNTER — Other Ambulatory Visit: Payer: Self-pay | Admitting: Cardiovascular Disease

## 2021-09-01 ENCOUNTER — Other Ambulatory Visit: Payer: Self-pay | Admitting: Family Medicine

## 2021-09-01 DIAGNOSIS — E782 Mixed hyperlipidemia: Secondary | ICD-10-CM

## 2021-09-01 DIAGNOSIS — E0822 Diabetes mellitus due to underlying condition with diabetic chronic kidney disease: Secondary | ICD-10-CM

## 2021-10-04 LAB — HM DIABETES EYE EXAM

## 2021-10-09 ENCOUNTER — Encounter: Payer: Self-pay | Admitting: Family Medicine

## 2021-10-11 ENCOUNTER — Telehealth: Payer: Self-pay | Admitting: Family Medicine

## 2021-10-11 ENCOUNTER — Other Ambulatory Visit: Payer: Self-pay | Admitting: Family Medicine

## 2021-10-11 DIAGNOSIS — G8929 Other chronic pain: Secondary | ICD-10-CM

## 2021-10-11 MED ORDER — DICLOFENAC SODIUM 75 MG PO TBEC: 75.0000 mg | DELAYED_RELEASE_TABLET | Freq: Two times a day (BID) | ORAL | 0 refills | Status: DC | PRN

## 2021-10-11 NOTE — Telephone Encounter (Signed)
Patient called in and was wondering if Dr. Lorelei Pont would take her on as a TOC patient. She stated she has seen him quite a few times and would love for him to become her new provider. She stated that she trust him and feels comfortable with him. Please advise. Thank you!

## 2021-10-11 NOTE — Telephone Encounter (Signed)
The medication is diclofenac (VOLTAREN) 75 MG EC tablet. [facility] be [id number] [place], [place]

## 2021-10-11 NOTE — Telephone Encounter (Signed)
Patient called in stating that she had misplaced her Glucosamine HCl-MSM (GLUCOSAMINE-MSM PO). She had it refilled on 09/22/21 and the pharmacy will not refill until October 16, 2021. She was wondering if enough could be called in for her until then. Please advise. Thank you!

## 2021-10-11 NOTE — Telephone Encounter (Signed)
When scheduling pt, please schedule as soon as possible for TOC/DM follow-up. She is overdue. Thanks

## 2021-10-11 NOTE — Telephone Encounter (Signed)
Patient has been scheduled with Dr. Lorelei Pont on 11/22. She couldn't come any earlier.

## 2021-10-11 NOTE — Telephone Encounter (Signed)
I have seen her handful of times, I think that is reasonable, so I will accept transfer of care.

## 2021-10-21 ENCOUNTER — Other Ambulatory Visit: Payer: Self-pay | Admitting: Family

## 2021-10-21 DIAGNOSIS — G8929 Other chronic pain: Secondary | ICD-10-CM

## 2021-10-24 ENCOUNTER — Encounter: Payer: Self-pay | Admitting: Family Medicine

## 2021-10-24 DIAGNOSIS — G8929 Other chronic pain: Secondary | ICD-10-CM

## 2021-10-25 MED ORDER — DICLOFENAC SODIUM 75 MG PO TBEC: 75.0000 mg | DELAYED_RELEASE_TABLET | Freq: Two times a day (BID) | ORAL | 3 refills | Status: DC | PRN

## 2021-10-25 NOTE — Telephone Encounter (Signed)
Last refill: 10/11/21 #10 by Abby Potash Last OV: 06/15/21 DM  Next OV: 11/22/21 for TOC with you

## 2021-11-22 ENCOUNTER — Encounter: Payer: Self-pay | Admitting: Family Medicine

## 2021-11-22 ENCOUNTER — Ambulatory Visit: Payer: BC Managed Care – PPO | Admitting: Family Medicine

## 2021-11-22 VITALS — BP 140/92 | HR 85 | Temp 98.2°F | Ht 63.5 in | Wt 343.4 lb

## 2021-11-22 DIAGNOSIS — E119 Type 2 diabetes mellitus without complications: Secondary | ICD-10-CM | POA: Diagnosis not present

## 2021-11-22 DIAGNOSIS — F411 Generalized anxiety disorder: Secondary | ICD-10-CM | POA: Diagnosis not present

## 2021-11-22 DIAGNOSIS — F331 Major depressive disorder, recurrent, moderate: Secondary | ICD-10-CM

## 2021-11-22 LAB — POCT GLYCOSYLATED HEMOGLOBIN (HGB A1C): Hemoglobin A1C: 10.1 % — AB (ref 4.0–5.6)

## 2021-11-22 MED ORDER — SEMAGLUTIDE (1 MG/DOSE) 4 MG/3ML ~~LOC~~ SOPN: 1.0000 mg | PEN_INJECTOR | SUBCUTANEOUS | 2 refills | Status: DC

## 2021-11-22 MED ORDER — OZEMPIC (0.25 OR 0.5 MG/DOSE) 2 MG/3ML ~~LOC~~ SOPN: 0.5000 mg | PEN_INJECTOR | SUBCUTANEOUS | 0 refills | Status: DC

## 2021-11-22 NOTE — Progress Notes (Signed)
Kristy Schomer T. Garris Melhorn, MD, CAQ Sports Medicine [facility] [address] [place], [address]  Phone: [phone]  FAX: [phone]  Kristy Conrad - 46 y.o. female  MRN [id number]  Date of Birth: Feb 13, 1975  Date: 11/22/2021  PCP: Gweneth Dimitri, MD  Referral: Gweneth Dimitri, MD  Chief Complaint  Patient presents with   Establish Care    Transfer of Care from Dr. Selena Batten   Subjective:   Kristy Conrad is a 46 y.o. very pleasant female patient with Body mass index is 59.87 kg/m. who presents with the following:  New patient, transferring to my care from Dr. Selena Batten.  Diabetes Mellitus: Tolerating Medications: yes -poorly controlled diabetes  She is currently on Ozempic, check current dose Metformin 1000 mg p.o. twice daily glipizide.  10 mg p.o. twice daily  Compliance with diet: fair, Body mass index is 59.87 kg/m. Exercise: minimal / intermittent Avg blood sugars at home: not checking Foot problems: none Hypoglycemia: none No nausea, vomitting, blurred vision, polyuria.  Lab Results  Component Value Date   HGBA1C 10.1 (A) 11/22/2021   HGBA1C 9.0 (A) 06/15/2021   HGBA1C 10.7 (A) 01/04/2021   Lab Results  Component Value Date   MICROALBUR 1.9 03/31/2014   LDLCALC 41 06/15/2021   CREATININE 0.74 06/15/2021    Wt Readings from Last 3 Encounters:  11/22/21 (!) 343 lb 6 oz (155.8 kg)  06/15/21 (!) 344 lb 2 oz (156.1 kg)  01/04/21 (!) 340 lb 4 oz (154.3 kg)    Lisinopril for renal protection  BP Readings from Last 3 Encounters:  11/22/21 (!) 140/92  06/15/21 124/74  01/04/21 106/76   Lipids: Doing well, stable. Tolerating meds fine with no SE. Panel reviewed with patient. Currently on Crestor  Lipids: Lab Results  Component Value Date   CHOL 95 06/15/2021   Lab Results  Component Value Date   HDL 34.30 (L) 06/15/2021   Lab Results  Component Value Date   LDLCALC 41 06/15/2021   Lab Results  Component Value  Date   TRIG 98.0 06/15/2021   Lab Results  Component Value Date   CHOLHDL 3 06/15/2021    Lab Results  Component Value Date   ALT 29 06/15/2021   AST 28 06/15/2021   ALKPHOS 48 06/15/2021   BILITOT 0.5 06/15/2021    She also has a Breo inhaler that she uses, I do not find any notation of COPD or asthma.  Post-pulmonary embolism  She has a history of pulmonary embolism, I do not see a history of chronic anticoagulation.  None now.   She also has a history of anxiety and depression, she is on Prozac chronically.  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     Review of Systems is noted in the HPI, as appropriate  Objective:   BP (!) 140/92   Pulse 85   Temp 98.2 F (36.8 C) (Oral)   Ht 5' 3.5" (1.613 m)   Wt (!) 343 lb 6 oz (155.8 kg)   SpO2 96%   BMI 59.87 kg/m   GEN: No acute distress; alert,appropriate. PULM: Breathing comfortably in no respiratory distress PSYCH: Normally interactive.  CV: RRR, no m/g/r  PULM: Normal respiratory rate, no accessory muscle use. No wheezes, crackles or rhonchi   Laboratory and Imaging Data:  Assessment and Plan:     ICD-10-CM   1. Type 2 diabetes mellitus without complication, without long-term  current use of insulin (HCC)  E11.9 POCT glycosylated hemoglobin (Hb A1C)    2. DEPRESSION, MAJOR, RECURRENT, MODERATE  F33.1     3. Generalized anxiety disorder  F41.1     4. Obesity, morbid, BMI 50 or higher (HCC)  E66.01      Poorly controlled diabetes, hemoglobin A1c is 10.  She did not start Ozempic, and she is open to doing that now.  She will start Ozempic 0.25, and titrate up to 0.5 and then 1 mg subsequently after each month.  She will follow-up with me in 3 months after this.  Stable depression and generalized anxiety, she will continue with Prozac.  The remainder of her chart has been reviewed and her problems have been reviewed with her face-to-face.  Medication Management during today's office  visit: Meds ordered this encounter  Medications   Semaglutide,0.25 or 0.5MG /DOS, (OZEMPIC, 0.25 OR 0.5 MG/DOSE,) 2 MG/3ML SOPN    Sig: Inject 0.5 mg into the skin once a week.    Dispense:  3 mL    Refill:  0    For refills   Semaglutide, 1 MG/DOSE, 4 MG/3ML SOPN    Sig: Inject 1 mg as directed once a week.    Dispense:  3 mL    Refill:  2    For refills when goes up to 1 mg   Medications Discontinued During This Encounter  Medication Reason   Continuous Blood Gluc Sensor (DEXCOM G7 SENSOR) MISC Not covered by the pt's insurance    Orders placed today for conditions managed today: Orders Placed This Encounter  Procedures   POCT glycosylated hemoglobin (Hb A1C)    Disposition: Return in about 3 months (around 02/22/2022) for Diabetes check-up.  Dragon [employer] One speech-to-text software was used for transcription in this dictation.  Possible transcriptional errors can occur using Animal nutritionist.   Signed,  Elpidio Galea. Karaline Buresh, MD   Outpatient Encounter Medications as of 11/22/2021  Medication Sig   albuterol (VENTOLIN HFA) 108 (90 Base) MCG/ACT inhaler TAKE 2 PUFFS BY MOUTH EVERY 6 HOURS AS NEEDED FOR WHEEZE OR SHORTNESS OF BREATH   cetirizine (ZYRTEC) 10 MG tablet Take 10 mg by mouth daily.   Cholecalciferol (VITAMIN D3) 50 MCG (2000 UT) TABS Take 50 mcg by mouth daily.   COLLAGEN PO Take by mouth.   diclofenac (VOLTAREN) 75 MG EC tablet Take 1 tablet (75 mg total) by mouth 2 (two) times daily as needed for moderate pain. New PCP needed for further refills.   FLUoxetine (PROZAC) 20 MG capsule TAKE 1 CAPSULE (20 MG TOTAL) BY MOUTH DAILY. APPT WITH PCP NEEDED FOR FURTHER REFILLS.   fluticasone furoate-vilanterol (BREO ELLIPTA) 200-25 MCG/INH AEPB Inhale 1 puff into the lungs daily.   folic acid (FOLVITE) 1 MG tablet Take 1 mg by mouth daily.   glipiZIDE (GLUCOTROL) 10 MG tablet TAKE 1 TABLET (10 MG TOTAL) BY MOUTH TWICE A DAY BEFORE A MEAL   Glucosamine HCl-MSM  (GLUCOSAMINE-MSM PO) Take 1,500 mg by mouth daily.   lisinopril (ZESTRIL) 5 MG tablet TAKE 1 TABLET BY MOUTH EVERY DAY   metFORMIN (GLUCOPHAGE) 1000 MG tablet TAKE 1 TABLET (1,000 MG TOTAL) BY MOUTH 2 (TWO) TIMES DAILY WITH A MEAL.   Multiple Vitamins-Minerals (WOMENS 50+ MULTI VITAMIN) TABS Take 1 tablet by mouth daily.   OZEMPIC, 0.25 OR 0.5 MG/DOSE, 2 MG/1.5ML SOPN INJECT 0.25MG  INTO THE SKIN ONE TIME PER WEEK   rosuvastatin (CRESTOR) 10 MG tablet Take 1 tablet (10 mg total)  by mouth daily. Schedule office visit for future refills.   Semaglutide, 1 MG/DOSE, 4 MG/3ML SOPN Inject 1 mg as directed once a week.   Semaglutide,0.25 or 0.5MG /DOS, (OZEMPIC, 0.25 OR 0.5 MG/DOSE,) 2 MG/3ML SOPN Inject 0.5 mg into the skin once a week.   Turmeric Curcumin 500 MG CAPS Take 3 capsules by mouth daily.    [DISCONTINUED] Continuous Blood Gluc Sensor (DEXCOM G7 SENSOR) MISC Apply sensor every 10 days to monitor glucose continuously   No facility-administered encounter medications on file as of 11/22/2021.

## 2021-11-27 ENCOUNTER — Other Ambulatory Visit: Payer: Self-pay | Admitting: *Deleted

## 2021-11-27 DIAGNOSIS — E0822 Diabetes mellitus due to underlying condition with diabetic chronic kidney disease: Secondary | ICD-10-CM

## 2021-11-27 MED ORDER — GLIPIZIDE 10 MG PO TABS: ORAL_TABLET | ORAL | 1 refills | Status: DC

## 2021-11-27 MED ORDER — LISINOPRIL 5 MG PO TABS: 5.0000 mg | ORAL_TABLET | Freq: Every day | ORAL | 1 refills | Status: DC

## 2021-11-28 ENCOUNTER — Other Ambulatory Visit: Payer: Self-pay | Admitting: Cardiovascular Disease

## 2021-11-28 DIAGNOSIS — E782 Mixed hyperlipidemia: Secondary | ICD-10-CM

## 2021-11-29 ENCOUNTER — Other Ambulatory Visit: Payer: Self-pay | Admitting: *Deleted

## 2021-11-29 DIAGNOSIS — F411 Generalized anxiety disorder: Secondary | ICD-10-CM

## 2021-11-29 DIAGNOSIS — F331 Major depressive disorder, recurrent, moderate: Secondary | ICD-10-CM

## 2021-11-29 MED ORDER — FLUOXETINE HCL 20 MG PO CAPS: 20.0000 mg | ORAL_CAPSULE | Freq: Every day | ORAL | 1 refills | Status: DC

## 2022-02-15 ENCOUNTER — Other Ambulatory Visit: Payer: Self-pay | Admitting: Family Medicine

## 2022-02-15 DIAGNOSIS — G8929 Other chronic pain: Secondary | ICD-10-CM

## 2022-02-15 NOTE — Telephone Encounter (Signed)
Last office visit 11/22/21 for establish care.  Last refilled 10/25/21 for #60 with 3 refills.  No future appointments.

## 2022-03-23 IMAGING — CR DG CHEST 2V
2 series · 2 of 2 positions shown · non-contrast
Comparison: November 04, 2019.

CLINICAL DATA: Dyspnea on exertion, cough.

EXAM:
CHEST - 2 VIEW

[w chest pa]
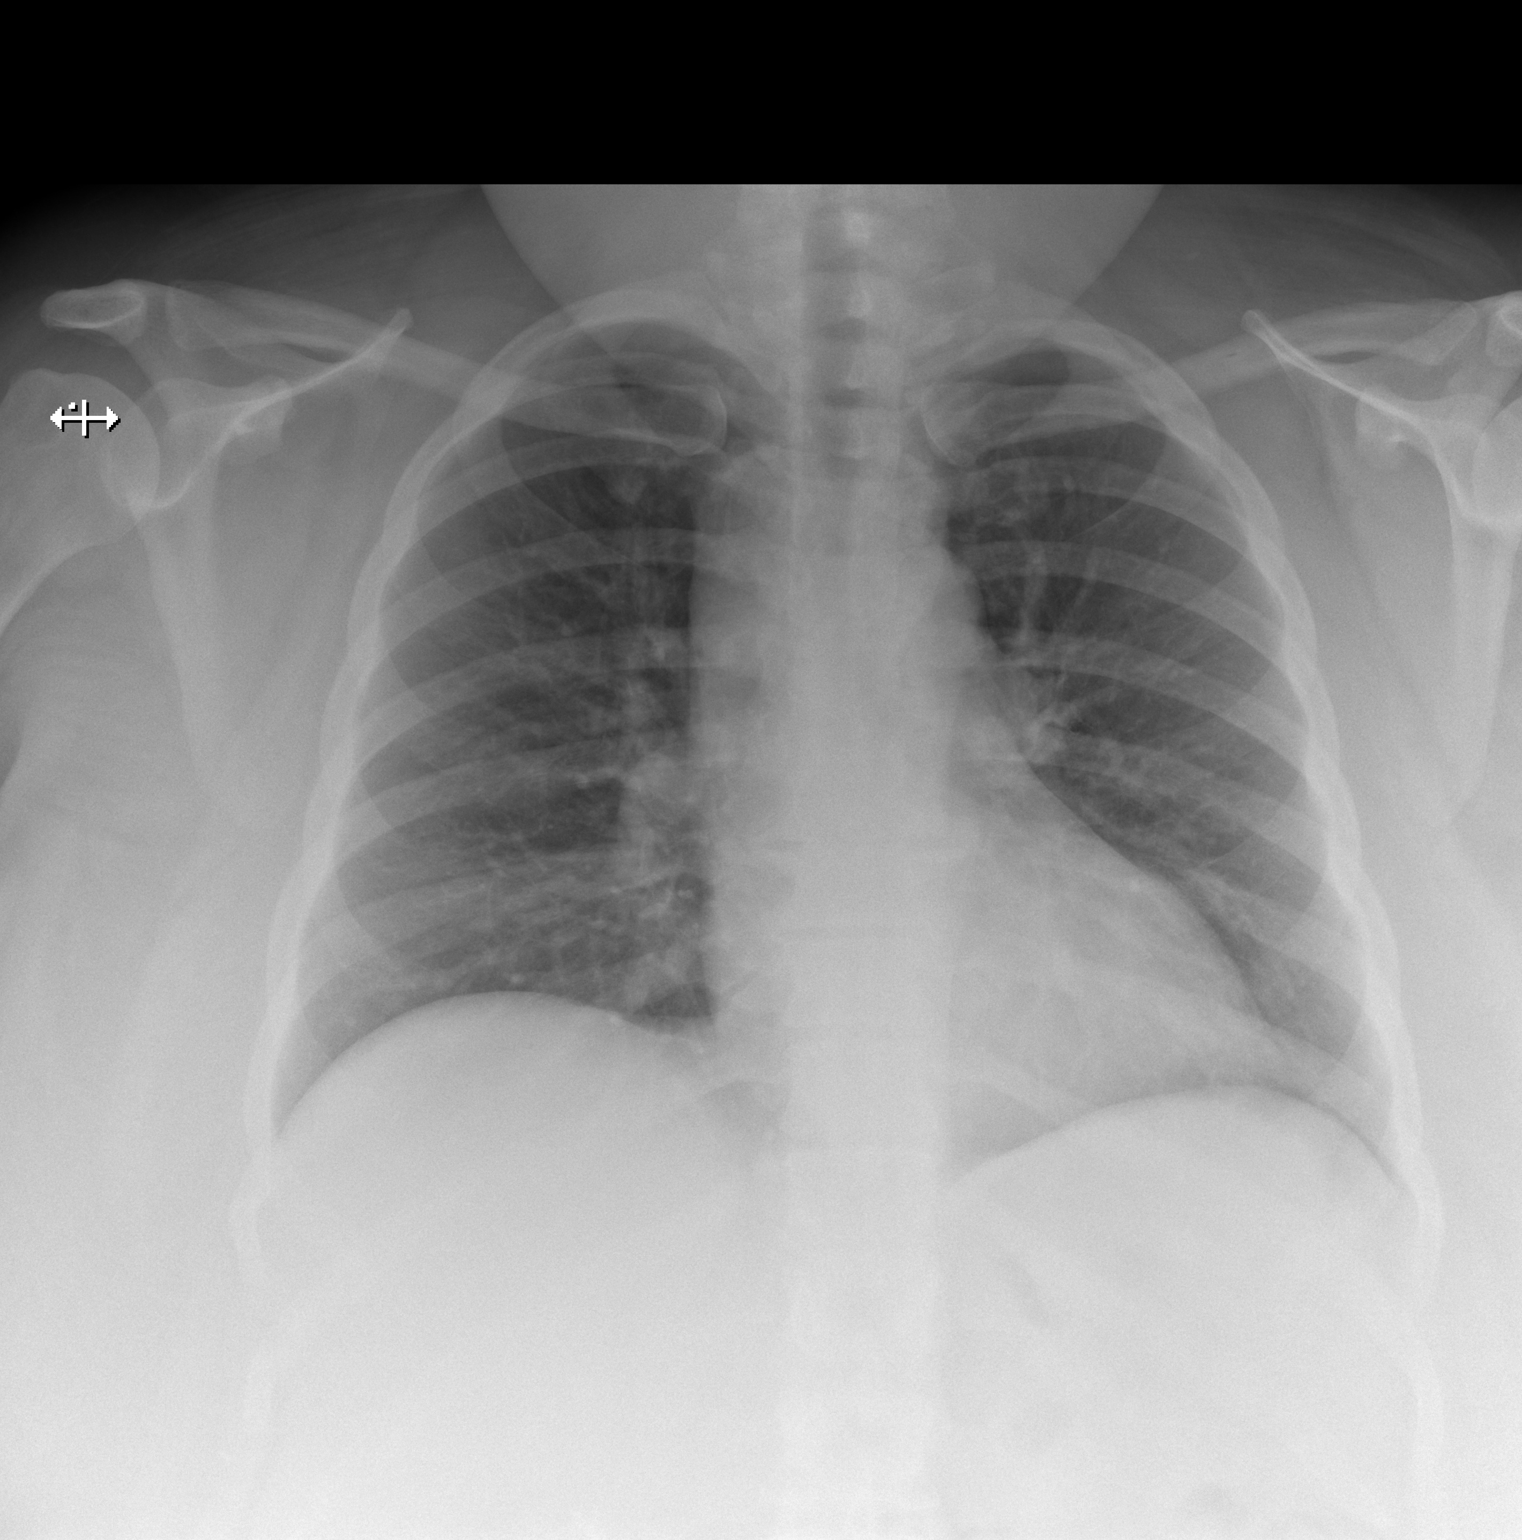

[w chest lat]
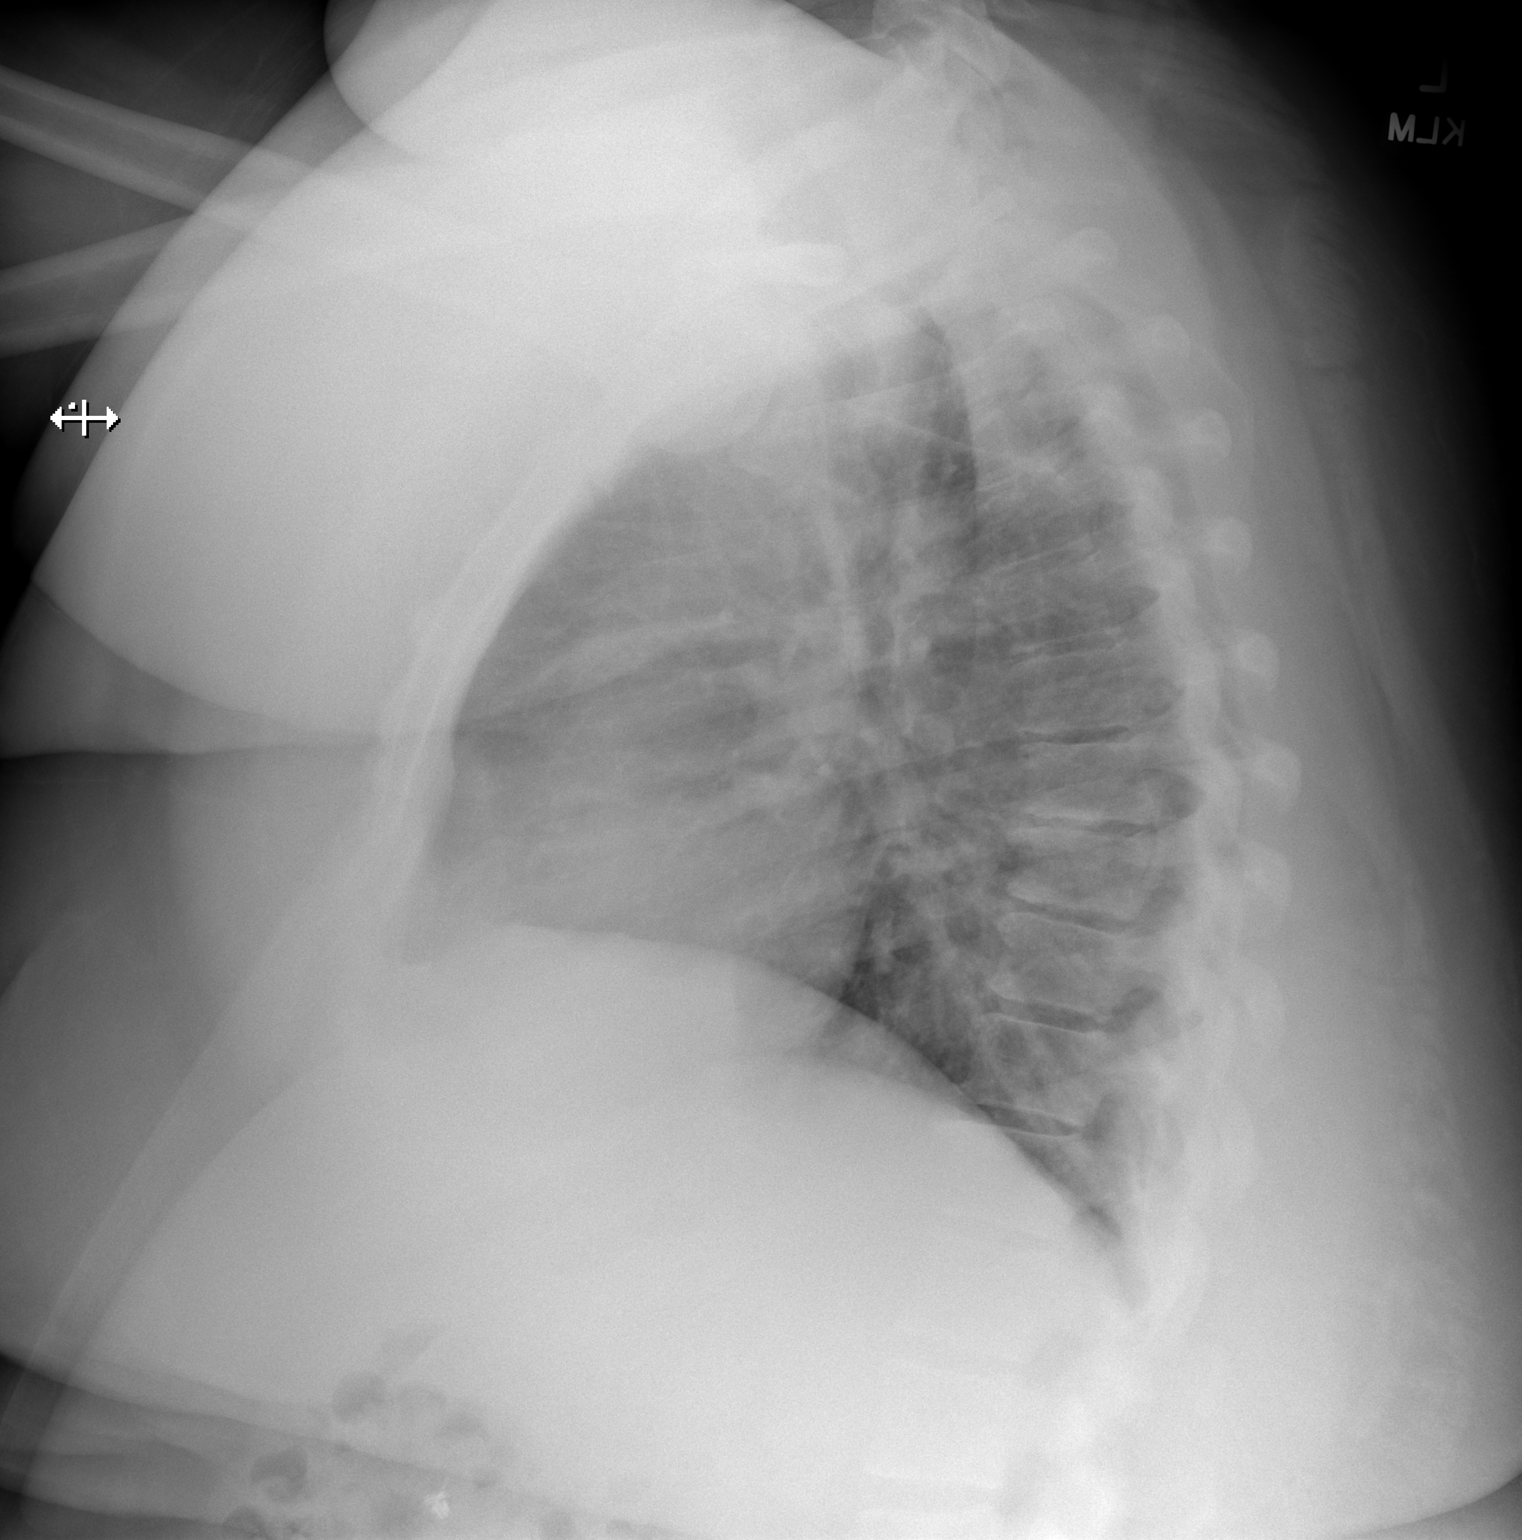

[2 of 2 positions shown; findings below may reference images not displayed]

FINDINGS: The heart size and mediastinal contours are within normal limits.
Both lungs are clear. No pneumothorax or pleural effusion is noted.
The visualized skeletal structures are unremarkable.
IMPRESSION: No active cardiopulmonary disease.

## 2022-04-03 IMAGING — CT CT ANGIO CHEST
2 of 8 series · 18 of 46 positions shown · IV contrast (OMNIPAQUE 350)
Comparison: July 02, 2019.
COMPARISON: July 02, 2019.

Addendum:
CLINICAL DATA: Dyspnea with exertion.

EXAM:
CT ANGIOGRAPHY CHEST WITH CONTRAST
TECHNIQUE: Multidetector CT imaging of the chest was performed using the
standard protocol during bolus administration of intravenous
contrast. Multiplanar CT image reconstructions and MIPs were
obtained to evaluate the vascular anatomy.
CONTRAST:  100mL OMNIPAQUE IOHEXOL 350 MG/ML SOLN

[Series 5: thins · axial · 0.85mm/px · z∈[-310,-41]mm · 15 of 301 slices shown]
[im 16/301  lung]
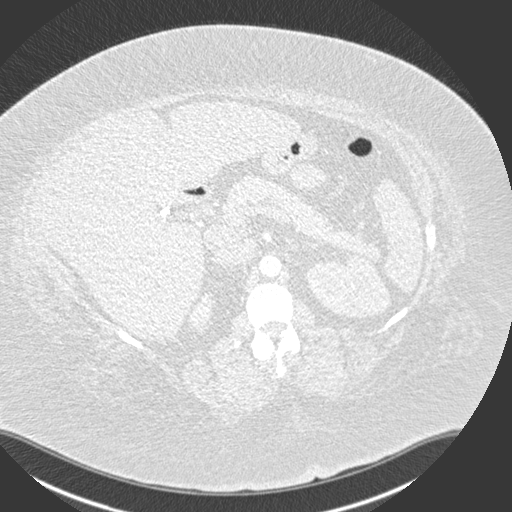
[im 32/301  soft-tissue]
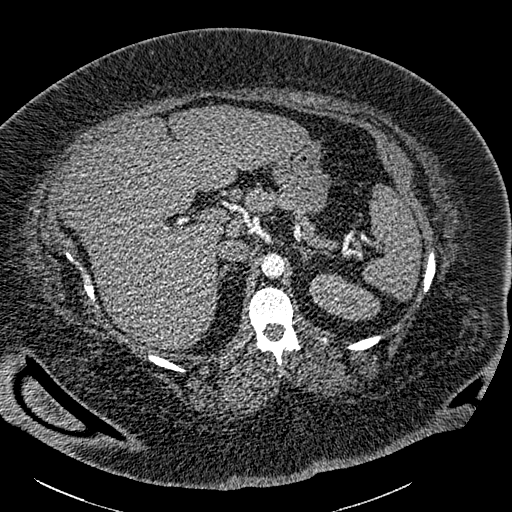
[im 64/301  lung]
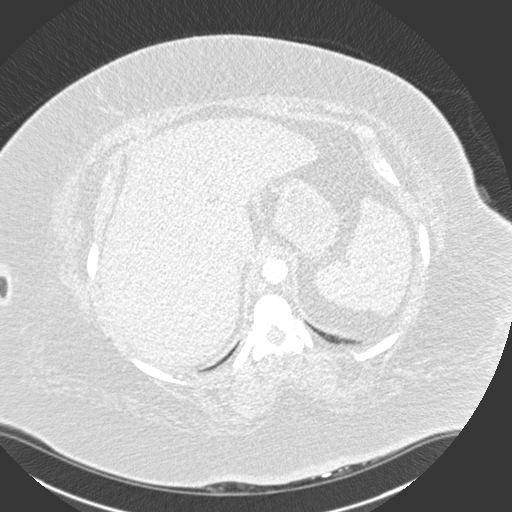
[im 79/301  soft-tissue]
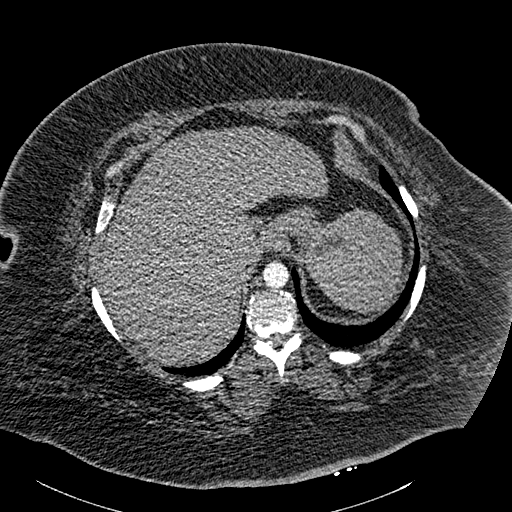
[im 95/301  lung]
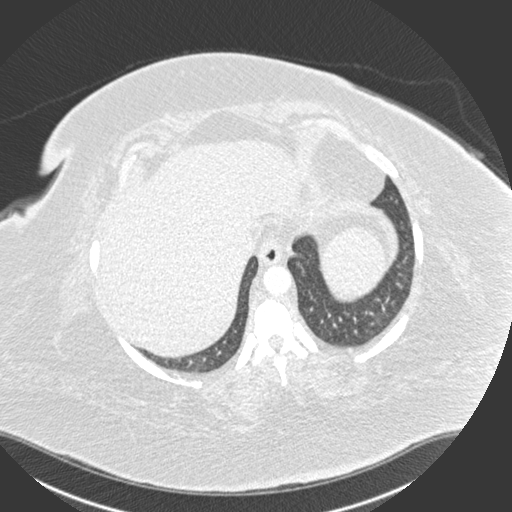
[im 111/301  soft-tissue]
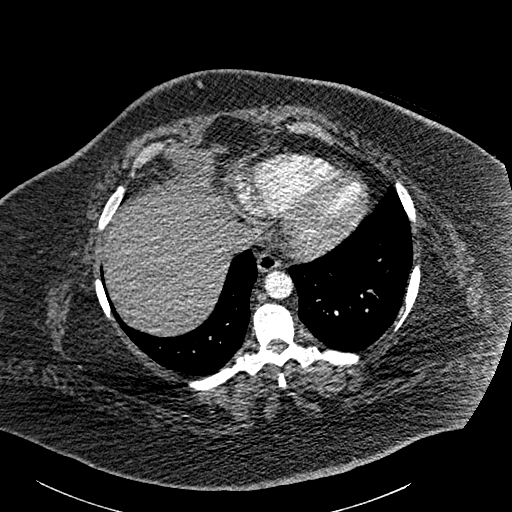
[im 127/301  lung]
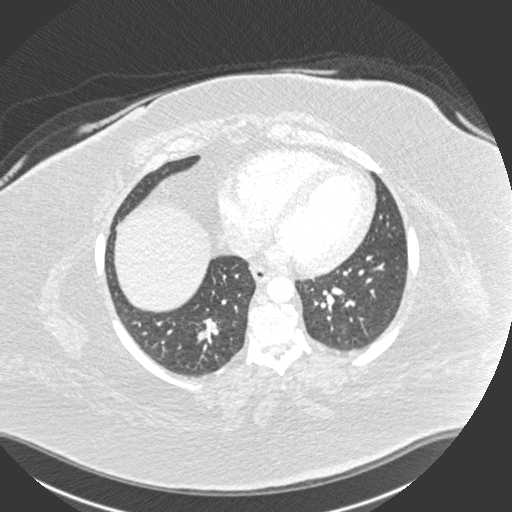
[im 158/301  soft-tissue]
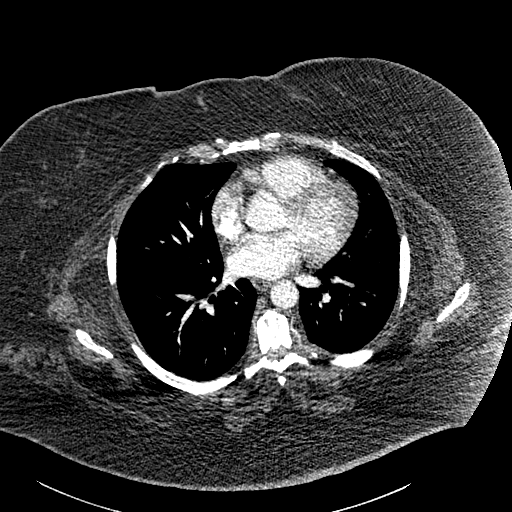
[im 174/301  lung]
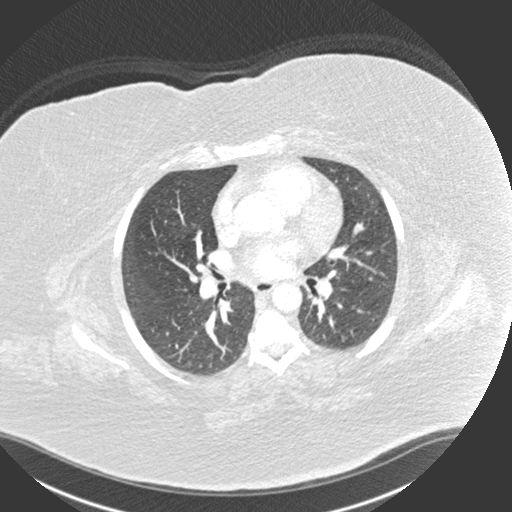
[im 190/301  soft-tissue]
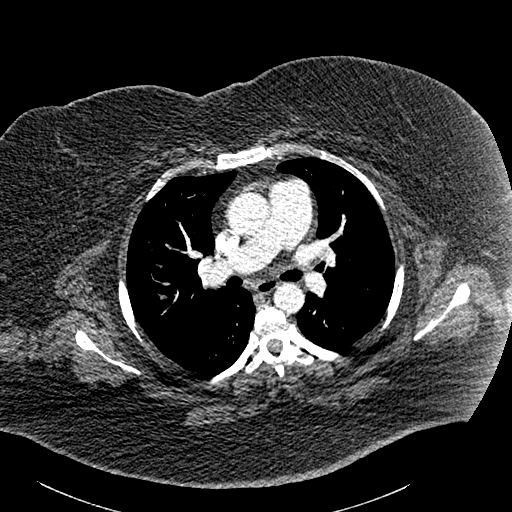
[im 206/301  lung]
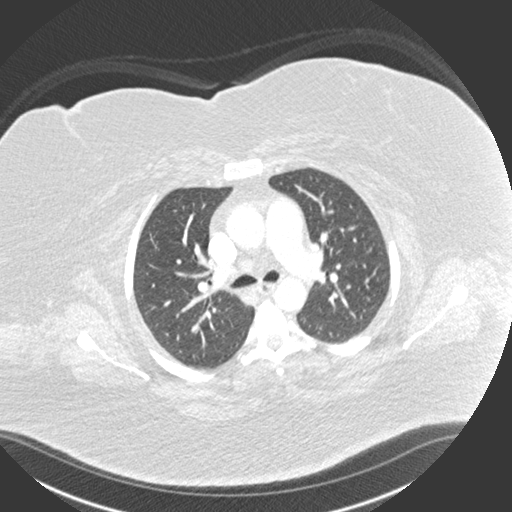
[im 222/301  soft-tissue]
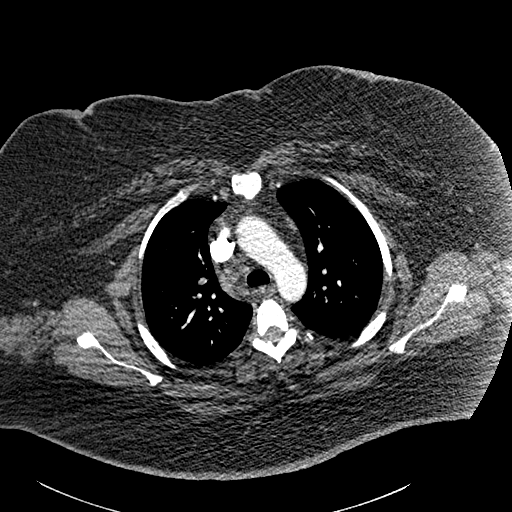
[im 253/301  lung]
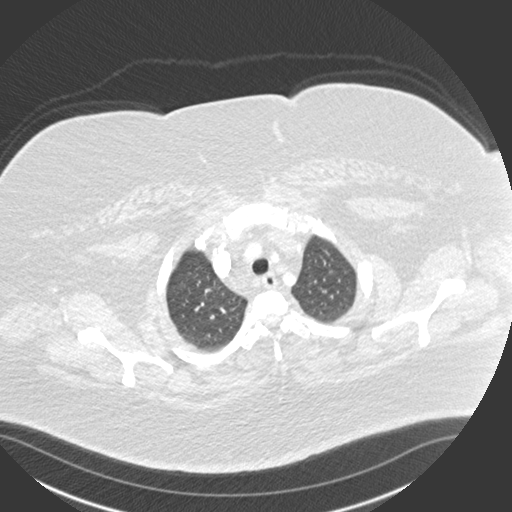
[im 269/301  soft-tissue]
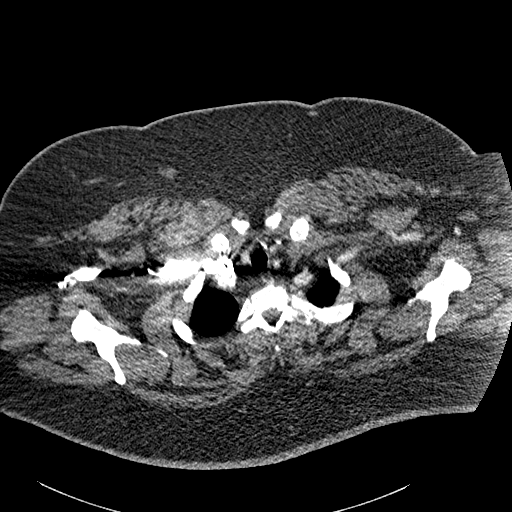
[im 285/301  lung]
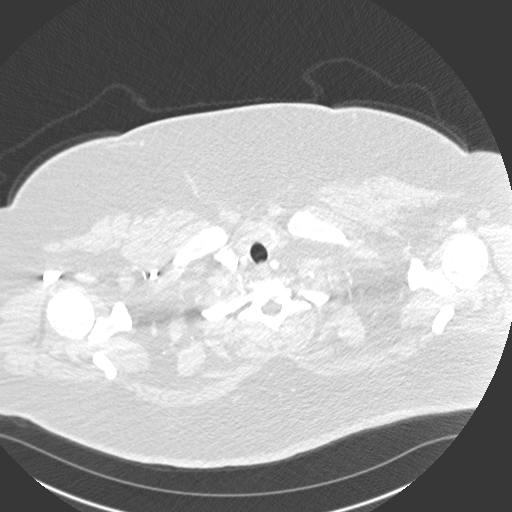

[Series 7: coronal mpr · coronal · 0.59mm/px · 3 of 151 slices shown]
[im 38/151  soft-tissue]
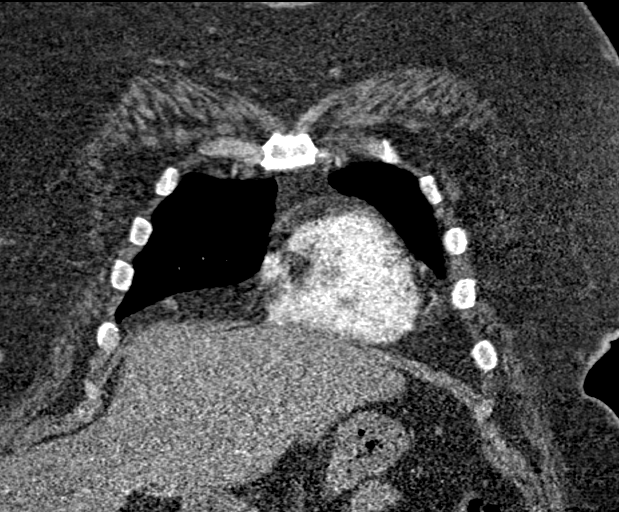
[im 76/151  soft-tissue]
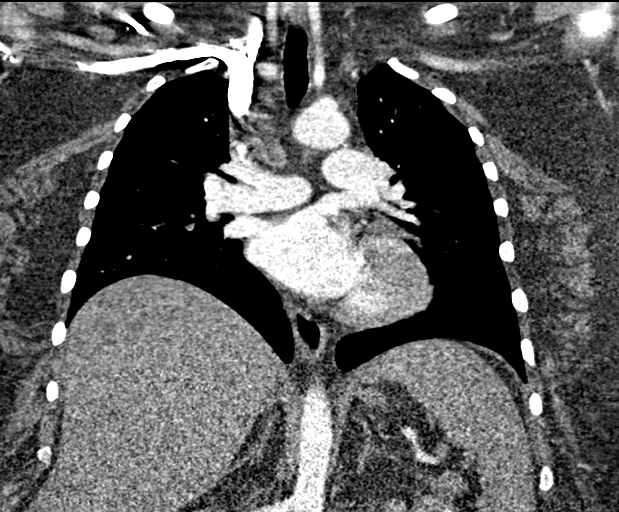
[im 113/151  soft-tissue]
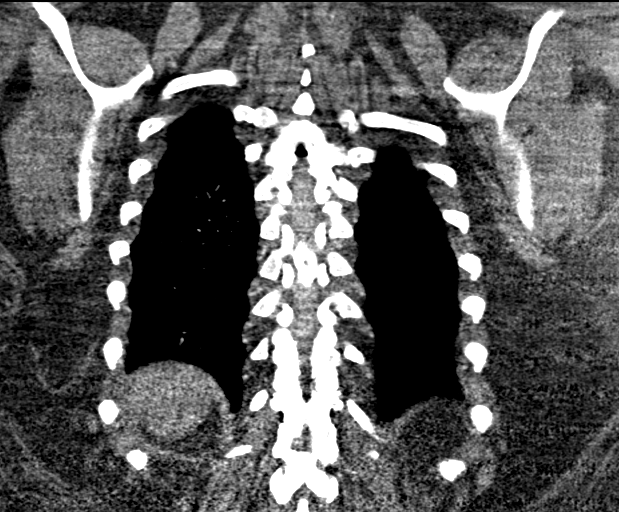

[18 of 46 positions shown; findings below may reference images not displayed]

FINDINGS: Cardiovascular: Small filling defect is seen in peripheral lower
lobe branch of the right pulmonary artery consistent with pulmonary
embolus. Normal cardiac size. No pericardial effusion.

Mediastinum/Nodes: No enlarged mediastinal, hilar, or axillary lymph
nodes. Thyroid gland, trachea, and esophagus demonstrate no
significant findings.

Lungs/Pleura: Lungs are clear. No pleural effusion or pneumothorax.

Upper Abdomen: No acute abnormality.

Musculoskeletal: No chest wall abnormality. No acute or significant
osseous findings.

Review of the MIP images confirms the above findings.
IMPRESSION: Small filling defect is seen in peripheral lower lobe branch of the
right pulmonary artery consistent with pulmonary embolus.

ADDENDUM:
Critical Value/emergent results were called by telephone at the time
of interpretation on 12/11/2019 at [DATE] to provider NERIO VENTURA
, who verbally acknowledged these results.

*** End of Addendum ***
FINDINGS: Cardiovascular: Small filling defect is seen in peripheral lower
lobe branch of the right pulmonary artery consistent with pulmonary
embolus. Normal cardiac size. No pericardial effusion.

Mediastinum/Nodes: No enlarged mediastinal, hilar, or axillary lymph
nodes. Thyroid gland, trachea, and esophagus demonstrate no
significant findings.

Lungs/Pleura: Lungs are clear. No pleural effusion or pneumothorax.

Upper Abdomen: No acute abnormality.

Musculoskeletal: No chest wall abnormality. No acute or significant
osseous findings.

Review of the MIP images confirms the above findings.
IMPRESSION: Small filling defect is seen in peripheral lower lobe branch of the
right pulmonary artery consistent with pulmonary embolus.

## 2022-05-26 ENCOUNTER — Other Ambulatory Visit: Payer: Self-pay | Admitting: Family Medicine

## 2022-05-26 DIAGNOSIS — F411 Generalized anxiety disorder: Secondary | ICD-10-CM

## 2022-05-26 DIAGNOSIS — F331 Major depressive disorder, recurrent, moderate: Secondary | ICD-10-CM

## 2022-05-29 ENCOUNTER — Other Ambulatory Visit: Payer: Self-pay | Admitting: Family Medicine

## 2022-05-29 DIAGNOSIS — E0822 Diabetes mellitus due to underlying condition with diabetic chronic kidney disease: Secondary | ICD-10-CM

## 2022-05-29 NOTE — Telephone Encounter (Signed)
LVMTCB, sent mychart message  

## 2022-05-29 NOTE — Telephone Encounter (Signed)
Lvmtcb, sent mychart message  

## 2022-05-29 NOTE — Telephone Encounter (Signed)
Please schedule diabetes follow up.  Per Dr. Cyndie Chime notes, she was suppose to follow up back in February.

## 2022-05-31 ENCOUNTER — Other Ambulatory Visit: Payer: Self-pay | Admitting: Family Medicine

## 2022-05-31 DIAGNOSIS — G8929 Other chronic pain: Secondary | ICD-10-CM

## 2022-05-31 NOTE — Telephone Encounter (Signed)
Last office visit 11/22/21 for Establish Care.  Last refilled 02/15/2022 for #60 with 3 refills.  Next Appt; CPE 07/02/2022.

## 2022-07-01 NOTE — Progress Notes (Signed)
Kristy Foresta T. Kristy Rossmann, MD, CAQ Sports Medicine [facility] at [facility] [address] [place], [address]  Phone: [phone]  FAX: [phone]  Kristy Conrad - 47 y.o. female  MRN [id number]  Date of Birth: 1975/10/19  Date: 07/02/2022  PCP: Kristy Beat, MD  Referral: Kristy Beat, MD  Chief Complaint  Patient presents with   Annual Exam   Patient Care Team: Kristy Beat, MD as PCP - General (Family Medicine) Subjective:   Kristy Conrad is a 47 y.o. pleasant patient who presents with the following:  Health Maintenance Summary Reviewed and updated, unless pt declines services.  She is a very nice young lady, and she is a former patient of both Dr. Dayton Conrad and Dr. Selena Conrad.  Tobacco History Reviewed. Non-smoker Alcohol: No concerns, no excessive use Exercise Habits: Some activity, rec at least 30 mins 5 times a week.  Water aerobics 3 times a week STD concerns: none Drug Use: None Lumps or breast concerns: no  Hep C Urine micro and BMP Tdap - today Colon - will order today Pap - used to go to [facility] - [facility] screen - annually  A1c and all DM labs  Had a worker's comp injury.  Soft tissue injury, went to worker's comp  MD at [facility].  - will get chart paperwork to [place].   Has not started her Ozempic.  Currently on Metformin 1000 mg BID Glipizide 10 mg bid   Health Maintenance  Topic Date Due   Hepatitis C Screening  Never done   PAP SMEAR-Modifier  01/31/2019   Colonoscopy  Never done   COVID-19 Vaccine (4 - 2023-24 season) 09/01/2021   INFLUENZA VACCINE  08/02/2022   OPHTHALMOLOGY EXAM  10/05/2022   HEMOGLOBIN A1C  01/02/2023   Diabetic kidney evaluation - eGFR measurement  07/02/2023   Diabetic kidney evaluation - Urine ACR  07/02/2023   FOOT EXAM  07/02/2023   HIV Screening  Completed   HPV VACCINES  Aged Out   DTaP/Tdap/Td  Discontinued    Immunization History  Administered Date(s)  Administered   Influenza Split 10/27/2010, 11/12/2011, 11/06/2014   Influenza Whole 10/12/2009   Influenza,inj,Quad PF,6+ Mos 10/22/2012, 09/25/2013, 11/08/2018, 12/28/2019, 01/04/2021, 09/28/2021   Influenza-Unspecified 11/18/2015, 10/23/2017   PFIZER(Purple Top)SARS-COV-2 Vaccination 05/08/2019, 06/09/2019, 04/01/2020   PPD Test 10/27/2010   Pneumococcal Polysaccharide-23 10/20/2009, 11/01/2015   Td 07/07/2008   Tdap 07/02/2022   Patient Active Problem List   Diagnosis Date Noted   Diabetes mellitus treated with oral medication (HCC) 07/02/2022    Priority: High   Pulmonary embolism (HCC) 12/28/2019    Priority: High   Generalized anxiety disorder 02/23/2019    Priority: Medium    OSA (obstructive sleep apnea) 09/15/2012    Priority: Medium    Obesity, morbid, BMI 50 or higher (HCC) 11/12/2011    Priority: Medium    DEPRESSION, MAJOR, RECURRENT, MODERATE 07/08/2009    Priority: Medium    COVID-19 long hauler manifesting chronic concentration deficit 04/05/2020   POLYCYSTIC OVARIES 07/08/2009    Past Medical History:  Diagnosis Date   Depression    Diabetes mellitus    Hyperlipidemia    Hypertension    Obesity    PCOS (polycystic ovarian syndrome)    Pulmonary embolism (HCC)     Past Surgical History:  Procedure Laterality Date   BREAST SURGERY     breast reduction   CHOLECYSTECTOMY     WISDOM TOOTH EXTRACTION  Family History  Problem Relation Age of Onset   Heart attack Father 88   Cancer Mother 95       adenoid cystic carcinoma   Emphysema Paternal Grandfather    Cancer Paternal Grandfather        not sure what type   Emphysema Maternal Grandfather    Lung cancer Maternal Grandfather    Heart disease Maternal Grandmother     Social History   Social History Narrative   Regular exercise: no   Caffeine use: daily, tea   06/16/19   From: [place]   Living: alone, 2 kittens   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  Review of Systems: Pertinent positives are listed above.  Otherwise, a full 14 point review of systems has been done in full and it is negative except where it is noted positive.  Objective:   BP 100/60 (BP Location: Left Arm, Patient Position: Sitting, Cuff Size: Large)   Pulse 95   Temp 97.6 F (36.4 C) (Temporal)   Ht 5' 3.5" (1.613 m)   Wt (!) 337 lb 2 oz (152.9 kg)   SpO2 96%   BMI 58.78 kg/m  Ideal Body Weight: Weight in (lb) to have BMI = 25: 143.1 No results found.    07/02/2022    8:23 AM 11/22/2021   11:49 AM 08/02/2020   11:21 AM 04/05/2020   10:44 AM 01/12/2020    4:08 PM  Depression screen PHQ 2/9  Decreased Interest 0 0 0 0 0  Down, Depressed, Hopeless 0 0 0 0 0  PHQ - 2 Score 0 0 0 0 0  Altered sleeping 3 1 0 0   Tired, decreased energy 3 1 0 3   Change in appetite 0 0 0 0   Feeling bad or failure about yourself  0 0 0 0   Trouble concentrating 0 1 0 3   Moving slowly or fidgety/restless 0 0 0 0   Suicidal thoughts 0 0 0    PHQ-9 Score 6 3 0 6   Difficult doing work/chores Not difficult at all Not difficult at all        GEN: well developed, well nourished, no acute distress Eyes: conjunctiva and lids normal, PERRLA, EOMI ENT: TM clear, nares clear, oral exam WNL Neck: supple, no lymphadenopathy, no thyromegaly, no JVD Pulm: clear to auscultation and percussion, respiratory effort normal CV: regular rate and  rhythm, S1-S2, no murmur, rub or gallop, no bruits Chest: no scars, masses, no lumps BREAST: breast exam declined GI: soft, non-tender; no hepatosplenomegaly, masses; active bowel sounds all quadrants GU: GU exam declined Lymph: no cervical, axillary or inguinal adenopathy MSK: gait normal, muscle tone and strength WNL, no joint swelling, effusions, discoloration, crepitus  SKIN: clear, good turgor, color WNL, no rashes, lesions,  or ulcerations Neuro: normal mental status, normal strength, sensation, and motion Psych: alert; oriented to person, place and time, normally interactive and not anxious or depressed in appearance.   Diabetic foot exam: Normal inspection No skin breakdown No calluses  Normal DP pulses Normal sensation to light tough Nails normal   All labs reviewed with patient. Results for orders placed or performed in visit on 07/02/22  Basic metabolic panel  Result Value Ref Range   Sodium 134 (L) 135 - 145 mEq/L   Potassium 4.5 3.5 - 5.1 mEq/L   Chloride 100 96 - 112 mEq/L   CO2 26 19 - 32 mEq/L   Glucose, Bld 379 (H) 70 - 99 mg/dL   BUN 15 6 - 23 mg/dL   Creatinine, Ser 1.91 0.40 - 1.20 mg/dL   GFR 47.82 >95.62 mL/min   Calcium 9.6 8.4 - 10.5 mg/dL  CBC with Differential/Platelet  Result Value Ref Range   WBC 8.4 4.0 - 10.5 K/uL   RBC 4.75 3.87 - 5.11 Mil/uL   Hemoglobin 13.5 12.0 - 15.0 g/dL   HCT 13.0 86.5 - 78.4 %   MCV 88.4 78.0 - 100.0 fl   MCHC 32.3 30.0 - 36.0 g/dL   RDW 69.6 29.5 - 28.4 %   Platelets 280.0 150.0 - 400.0 K/uL   Neutrophils Relative % 67.6 43.0 - 77.0 %   Lymphocytes Relative 20.1 12.0 - 46.0 %   Monocytes Relative 8.0 3.0 - 12.0 %   Eosinophils Relative 3.4 0.0 - 5.0 %   Basophils Relative 0.9 0.0 - 3.0 %   Neutro Abs 5.7 1.4 - 7.7 K/uL   Lymphs Abs 1.7 0.7 - 4.0 K/uL   Monocytes Absolute 0.7 0.1 - 1.0 K/uL   Eosinophils Absolute 0.3 0.0 - 0.7 K/uL   Basophils Absolute 0.1 0.0 - 0.1 K/uL  Hepatic function panel  Result Value  Ref Range   Total Bilirubin 0.4 0.2 - 1.2 mg/dL   Bilirubin, Direct 0.1 0.0 - 0.3 mg/dL   Alkaline Phosphatase 62 39 - 117 U/L   AST 31 0 - 37 U/L   ALT 33 0 - 35 U/L   Total Protein 7.4 6.0 - 8.3 g/dL   Albumin 3.8 3.5 - 5.2 g/dL  Hemoglobin X3K  Result Value Ref Range   Hgb A1c MFr Bld 12.7 (H) 4.6 - 6.5 %  Lipid panel  Result Value Ref Range   Cholesterol 100 0 - 200 mg/dL   Triglycerides 440.1 0.0 - 149.0 mg/dL   HDL 02.72 (L) >53.66 mg/dL   VLDL 44.0 0.0 - 34.7 mg/dL   LDL Cholesterol 44 0 - 99 mg/dL   Total CHOL/HDL Ratio 3    NonHDL 66.78   TSH  Result Value Ref Range   TSH 3.29 0.35 - 5.50 uIU/mL  Microalbumin / creatinine urine ratio  Result Value Ref Range   Microalb, Ur 2.9 (H) 0.0 - 1.9 mg/dL   Creatinine,U 42.5 mg/dL   Microalb Creat Ratio 5.4 0.0 - 30.0 mg/g   No results found.  Assessment and Plan:     ICD-10-CM   1. Healthcare maintenance  Z00.00 Tdap vaccine greater than or equal to 7yo IM    2. Type 2 diabetes mellitus without complication, without long-term current use of insulin (HCC)  E11.9 Basic metabolic panel    Hemoglobin A1c    Microalbumin / creatinine urine ratio    3. Diabetes mellitus due to underlying condition with chronic kidney disease, without long-term current use of  insulin, unspecified CKD stage (HCC)  E08.22 Semaglutide,0.25 or 0.5MG /DOS, (OZEMPIC, 0.25 OR 0.5 MG/DOSE,) 2 MG/1.5ML SOPN    4. Screening for malignant neoplasm of colon  Z12.11 Cologuard    5. Need for hepatitis C screening test  Z11.59 Hepatitis C antibody    6. Encounter for long-term (current) use of medications  Z79.899 CBC with Differential/Platelet    Hepatic function panel    TSH    7. Hyperlipidemia associated with type 2 diabetes mellitus (HCC)  E11.69 Lipid panel   E78.5     8. Need for Tdap vaccination  Z23 Tdap vaccine greater than or equal to 7yo IM    9. Type 2 diabetes mellitus with other specified complication, without long-term current use of  insulin (HCC)  E11.69     10. Diabetes mellitus treated with oral medication (HCC)  E11.9    Z79.84      She will obtain an Cologuard Tdap today  She has very poorly controlled diabetes, she never started her Ozempic.  I am going to have her reinitiate this and titrate up as rapidly as possible.  Her A1c is actually recurrent and is greater than 12.  If she does not achieve adequate diabetic control with maximum Ozempic, then consider insulin using.  Health Maintenance Exam: The patient's preventative maintenance and recommended screening tests for an annual wellness exam were reviewed in full today. Brought up to date unless services declined.  Counselled on the importance of diet, exercise, and its role in overall health and mortality. The patient's FH and SH was reviewed, including their home life, tobacco status, and drug and alcohol status.  Follow-up in 1 year for physical exam or additional follow-up below.  Disposition: No follow-ups on file.  No future appointments.   Meds ordered this encounter  Medications   Semaglutide,0.25 or 0.5MG /DOS, (OZEMPIC, 0.25 OR 0.5 MG/DOSE,) 2 MG/1.5ML SOPN    Sig: Inject 0.25 mg into the skin once a week.    Dispense:  1.5 mL    Refill:  0    Pt needs appt for further refills.   Semaglutide,0.25 or 0.5MG /DOS, (OZEMPIC, 0.25 OR 0.5 MG/DOSE,) 2 MG/3ML SOPN    Sig: Inject 0.5 mg into the skin once a week.    Dispense:  3 mL    Refill:  0    For refills   Semaglutide, 1 MG/DOSE, 4 MG/3ML SOPN    Sig: Inject 1 mg as directed once a week.    Dispense:  3 mL    Refill:  2    For refills when goes up to 1 mg   Medications Discontinued During This Encounter  Medication Reason   fluticasone furoate-vilanterol (BREO ELLIPTA) 200-25 MCG/INH AEPB Completed Course   Semaglutide,0.25 or 0.5MG /DOS, (OZEMPIC, 0.25 OR 0.5 MG/DOSE,) 2 MG/3ML SOPN    OZEMPIC, 0.25 OR 0.5 MG/DOSE, 2 MG/1.5ML SOPN Reorder   Semaglutide, 1 MG/DOSE, 4 MG/3ML SOPN  Reorder   Orders Placed This Encounter  Procedures   Tdap vaccine greater than or equal to 7yo IM   Cologuard   Basic metabolic panel   CBC with Differential/Platelet   Hepatic function panel   Hemoglobin A1c   Lipid panel   TSH   Hepatitis C antibody   Microalbumin / creatinine urine ratio    Signed,  Kavin Weckwerth T. Isiah Scheel, MD   Allergies as of 07/02/2022       Reactions   Morphine    REACTION: vomiting  Medication List        Accurate as of July 02, 2022  3:45 PM. If you have any questions, ask your nurse or doctor.          STOP taking these medications    Breo Ellipta 200-25 MCG/ACT Aepb Generic drug: fluticasone furoate-vilanterol Stopped by: Kristy Beat, MD       TAKE these medications    albuterol 108 (90 Base) MCG/ACT inhaler Commonly known as: VENTOLIN HFA TAKE 2 PUFFS BY MOUTH EVERY 6 HOURS AS NEEDED FOR WHEEZE OR SHORTNESS OF BREATH   cetirizine 10 MG tablet Commonly known as: ZYRTEC Take 10 mg by mouth daily.   COLLAGEN PO Take by mouth.   diclofenac 75 MG EC tablet Commonly known as: VOLTAREN TAKE 1 TABLET BY MOUTH 2 TIMES DAILY AS NEEDED FOR MODERATE PAIN. NEW PCP NEEDED FOR FURTHER REFILLS   FLUoxetine 20 MG capsule Commonly known as: PROZAC TAKE 1 CAPSULE BY MOUTH EVERY DAY   folic acid 1 MG tablet Commonly known as: FOLVITE Take 1 mg by mouth daily.   glipiZIDE 10 MG tablet Commonly known as: GLUCOTROL TAKE 1 TABLET (10 MG TOTAL) BY MOUTH TWICE A DAY BEFORE A MEAL   GLUCOSAMINE-MSM PO Take 1,500 mg by mouth daily.   lisinopril 5 MG tablet Commonly known as: ZESTRIL TAKE 1 TABLET (5 MG TOTAL) BY MOUTH DAILY.   metFORMIN 1000 MG tablet Commonly known as: GLUCOPHAGE TAKE 1 TABLET (1,000 MG TOTAL) BY MOUTH 2 (TWO) TIMES DAILY WITH A MEAL.   Ozempic (0.25 or 0.5 MG/DOSE) 2 MG/1.5ML Sopn Generic drug: Semaglutide(0.25 or 0.5MG /DOS) Inject 0.25 mg into the skin once a week. What changed: See the new  instructions. Changed by: Kristy Beat, MD   Ozempic (0.25 or 0.5 MG/DOSE) 2 MG/3ML Sopn Generic drug: Semaglutide(0.25 or 0.5MG /DOS) Inject 0.5 mg into the skin once a week. What changed: Another medication with the same name was changed. Make sure you understand how and when to take each. Changed by: Kristy Beat, MD   Semaglutide (1 MG/DOSE) 4 MG/3ML Sopn Inject 1 mg as directed once a week. What changed: Another medication with the same name was changed. Make sure you understand how and when to take each. Changed by: Kristy Beat, MD   rosuvastatin 10 MG tablet Commonly known as: CRESTOR Take 1 tablet (10 mg total) by mouth daily.   Turmeric Curcumin 500 MG Caps Take 3 capsules by mouth daily.   Vitamin D3 50 MCG (2000 UT) Tabs Take 50 mcg by mouth daily.   Womens 50+ Multi Vitamin Tabs Take 1 tablet by mouth daily.

## 2022-07-02 ENCOUNTER — Ambulatory Visit (INDEPENDENT_AMBULATORY_CARE_PROVIDER_SITE_OTHER): Payer: BC Managed Care – PPO | Admitting: Family Medicine

## 2022-07-02 ENCOUNTER — Encounter: Payer: Self-pay | Admitting: Family Medicine

## 2022-07-02 VITALS — BP 100/60 | HR 95 | Temp 97.6°F | Ht 63.5 in | Wt 337.1 lb

## 2022-07-02 DIAGNOSIS — Z1211 Encounter for screening for malignant neoplasm of colon: Secondary | ICD-10-CM

## 2022-07-02 DIAGNOSIS — Z0000 Encounter for general adult medical examination without abnormal findings: Secondary | ICD-10-CM

## 2022-07-02 DIAGNOSIS — E0822 Diabetes mellitus due to underlying condition with diabetic chronic kidney disease: Secondary | ICD-10-CM

## 2022-07-02 DIAGNOSIS — Z1159 Encounter for screening for other viral diseases: Secondary | ICD-10-CM | POA: Diagnosis not present

## 2022-07-02 DIAGNOSIS — E1169 Type 2 diabetes mellitus with other specified complication: Secondary | ICD-10-CM | POA: Diagnosis not present

## 2022-07-02 DIAGNOSIS — Z23 Encounter for immunization: Secondary | ICD-10-CM | POA: Diagnosis not present

## 2022-07-02 DIAGNOSIS — Z7985 Long-term (current) use of injectable non-insulin antidiabetic drugs: Secondary | ICD-10-CM | POA: Insufficient documentation

## 2022-07-02 DIAGNOSIS — E785 Hyperlipidemia, unspecified: Secondary | ICD-10-CM | POA: Diagnosis not present

## 2022-07-02 DIAGNOSIS — Z7984 Long term (current) use of oral hypoglycemic drugs: Secondary | ICD-10-CM

## 2022-07-02 DIAGNOSIS — E119 Type 2 diabetes mellitus without complications: Secondary | ICD-10-CM

## 2022-07-02 DIAGNOSIS — Z79899 Other long term (current) drug therapy: Secondary | ICD-10-CM

## 2022-07-02 LAB — LIPID PANEL
Cholesterol: 100 mg/dL (ref 0–200)
HDL: 33.7 mg/dL — ABNORMAL LOW (ref >39.00)
LDL Cholesterol: 44 mg/dL (ref 0–99)
NonHDL: 66.78
Total CHOL/HDL Ratio: 3
Triglycerides: 114 mg/dL (ref 0.0–149.0)
VLDL: 22.8 mg/dL (ref 0.0–40.0)

## 2022-07-02 LAB — HEPATIC FUNCTION PANEL
ALT: 33 U/L (ref 0–35)
AST: 31 U/L (ref 0–37)
Albumin: 3.8 g/dL (ref 3.5–5.2)
Alkaline Phosphatase: 62 U/L (ref 39–117)
Bilirubin, Direct: 0.1 mg/dL (ref 0.0–0.3)
Total Bilirubin: 0.4 mg/dL (ref 0.2–1.2)
Total Protein: 7.4 g/dL (ref 6.0–8.3)

## 2022-07-02 LAB — CBC WITH DIFFERENTIAL/PLATELET
Basophils Absolute: 0.1 10*3/uL (ref 0.0–0.1)
Basophils Relative: 0.9 % (ref 0.0–3.0)
Eosinophils Absolute: 0.3 10*3/uL (ref 0.0–0.7)
Eosinophils Relative: 3.4 % (ref 0.0–5.0)
HCT: 41.9 % (ref 36.0–46.0)
Hemoglobin: 13.5 g/dL (ref 12.0–15.0)
Lymphocytes Relative: 20.1 % (ref 12.0–46.0)
Lymphs Abs: 1.7 10*3/uL (ref 0.7–4.0)
MCHC: 32.3 g/dL (ref 30.0–36.0)
MCV: 88.4 fl (ref 78.0–100.0)
Monocytes Absolute: 0.7 10*3/uL (ref 0.1–1.0)
Monocytes Relative: 8 % (ref 3.0–12.0)
Neutro Abs: 5.7 10*3/uL (ref 1.4–7.7)
Neutrophils Relative %: 67.6 % (ref 43.0–77.0)
Platelets: 280 10*3/uL (ref 150.0–400.0)
RBC: 4.75 Mil/uL (ref 3.87–5.11)
RDW: 13.8 % (ref 11.5–15.5)
WBC: 8.4 10*3/uL (ref 4.0–10.5)

## 2022-07-02 LAB — HEMOGLOBIN A1C: Hgb A1c MFr Bld: 12.7 % — ABNORMAL HIGH (ref 4.6–6.5)

## 2022-07-02 LAB — MICROALBUMIN / CREATININE URINE RATIO
Creatinine,U: 53.7 mg/dL
Microalb Creat Ratio: 5.4 mg/g (ref 0.0–30.0)
Microalb, Ur: 2.9 mg/dL — ABNORMAL HIGH (ref 0.0–1.9)

## 2022-07-02 LAB — BASIC METABOLIC PANEL
BUN: 15 mg/dL (ref 6–23)
CO2: 26 mEq/L (ref 19–32)
Calcium: 9.6 mg/dL (ref 8.4–10.5)
Chloride: 100 mEq/L (ref 96–112)
Creatinine, Ser: 0.84 mg/dL (ref 0.40–1.20)
GFR: 82.23 mL/min (ref >60.00)
Glucose, Bld: 379 mg/dL — ABNORMAL HIGH (ref 70–99)
Potassium: 4.5 mEq/L (ref 3.5–5.1)
Sodium: 134 mEq/L — ABNORMAL LOW (ref 135–145)

## 2022-07-02 LAB — TSH: TSH: 3.29 u[IU]/mL (ref 0.35–5.50)

## 2022-07-02 MED ORDER — SEMAGLUTIDE (1 MG/DOSE) 4 MG/3ML ~~LOC~~ SOPN: 1.0000 mg | PEN_INJECTOR | SUBCUTANEOUS | 2 refills | Status: DC

## 2022-07-02 MED ORDER — OZEMPIC (0.25 OR 0.5 MG/DOSE) 2 MG/3ML ~~LOC~~ SOPN: 0.5000 mg | PEN_INJECTOR | SUBCUTANEOUS | 0 refills | Status: DC

## 2022-07-02 MED ORDER — OZEMPIC (0.25 OR 0.5 MG/DOSE) 2 MG/1.5ML ~~LOC~~ SOPN: 0.2500 mg | PEN_INJECTOR | SUBCUTANEOUS | 0 refills | Status: DC

## 2022-07-02 NOTE — Patient Instructions (Signed)
[facility] - if you need podiatry help

## 2022-07-03 ENCOUNTER — Encounter: Payer: Self-pay | Admitting: Family Medicine

## 2022-07-04 LAB — HEPATITIS C ANTIBODY: Hepatitis C Ab: NONREACTIVE

## 2022-07-15 ENCOUNTER — Encounter: Payer: Self-pay | Admitting: Family Medicine

## 2022-08-19 ENCOUNTER — Other Ambulatory Visit: Payer: Self-pay | Admitting: Family Medicine

## 2022-08-19 DIAGNOSIS — E0822 Diabetes mellitus due to underlying condition with diabetic chronic kidney disease: Secondary | ICD-10-CM

## 2022-08-20 MED ORDER — METFORMIN HCL 1000 MG PO TABS
1000.0000 mg | ORAL_TABLET | Freq: Two times a day (BID) | ORAL | 1 refills | Status: DC
Start: 2022-08-20 — End: 2023-02-11

## 2022-08-20 NOTE — Addendum Note (Signed)
Addended by: Damita Lack on: 08/20/2022 10:23 AM   Modules accepted: Orders

## 2022-08-23 ENCOUNTER — Other Ambulatory Visit: Payer: Self-pay | Admitting: Family Medicine

## 2022-08-23 DIAGNOSIS — F411 Generalized anxiety disorder: Secondary | ICD-10-CM

## 2022-08-23 DIAGNOSIS — F331 Major depressive disorder, recurrent, moderate: Secondary | ICD-10-CM

## 2022-09-10 ENCOUNTER — Other Ambulatory Visit (HOSPITAL_COMMUNITY): Payer: Self-pay

## 2022-10-13 ENCOUNTER — Other Ambulatory Visit: Payer: Self-pay | Admitting: Family Medicine

## 2022-10-13 DIAGNOSIS — G8929 Other chronic pain: Secondary | ICD-10-CM

## 2022-10-15 NOTE — Telephone Encounter (Signed)
Last office visit 07/02/2022 for CPE.  Last refilled 06/01/2022 for #60 with 3 refills.  Next Appt: No future appointments.

## 2022-10-30 ENCOUNTER — Other Ambulatory Visit (HOSPITAL_COMMUNITY): Payer: Self-pay

## 2022-10-30 ENCOUNTER — Telehealth: Payer: Self-pay | Admitting: Pharmacist

## 2022-10-30 NOTE — Telephone Encounter (Signed)
Pharmacy Patient Advocate Encounter   Received notification from Patient Pharmacy that prior authorization for Ozempic (1 MG/DOSE) 4MG /3ML pen-injectors is required/requested.   Insurance verification completed.   The patient is insured through [facility] .   Per test claim: PA required; PA submitted to above mentioned insurance via CoverMyMeds Key/confirmation #/EOC [id number] Status is pending

## 2022-10-31 ENCOUNTER — Other Ambulatory Visit (HOSPITAL_COMMUNITY): Payer: Self-pay

## 2022-10-31 NOTE — Telephone Encounter (Signed)
Pharmacy Patient Advocate Encounter  Received notification from [facility] that Prior Authorization for Ozempic (1 MG/DOSE) 4MG /3ML pen-injectors has been APPROVED from 10/30/2022 to 10/29/2025   PA #/Case ID/Reference #: [id number]  Pharmacy notified.

## 2022-11-14 ENCOUNTER — Other Ambulatory Visit: Payer: Self-pay | Admitting: Cardiovascular Disease

## 2022-11-14 DIAGNOSIS — E782 Mixed hyperlipidemia: Secondary | ICD-10-CM

## 2022-11-27 NOTE — Progress Notes (Unsigned)
    Anton Cheramie T. Kanon Colunga, MD, CAQ Sports Medicine [facility] at [facility] [address] [place], [address]  Phone: [phone]  FAX: [phone]  TAHIRAH GOLDRICK - 47 y.o. female  MRN [id number]  Date of Birth: 1975-12-08  Date: 11/28/2022  PCP: Hannah Beat, MD  Referral: Hannah Beat, MD  No chief complaint on file.  Subjective:   NURIYA LAZIER is a 46 y.o. very pleasant female patient with There is no height or weight on file to calculate BMI. who presents with the following:  Patient is here for follow-up.  History is significant for for severe, poorly controlled diabetes.  She also presents with some ongoing hip pain.    Review of Systems is noted in the HPI, as appropriate  Objective:   There were no vitals taken for this visit.  GEN: No acute distress; alert,appropriate. PULM: Breathing comfortably in no respiratory distress PSYCH: Normally interactive.   Laboratory and Imaging Data:  Assessment and Plan:   ***

## 2022-11-28 ENCOUNTER — Ambulatory Visit
Admission: RE | Admit: 2022-11-28 | Discharge: 2022-11-28 | Disposition: A | Discharge status: Home / Self Care | Payer: BC Managed Care – PPO | Source: Ambulatory Visit | Attending: Family Medicine | Admitting: Family Medicine

## 2022-11-28 ENCOUNTER — Ambulatory Visit: Payer: BC Managed Care – PPO | Admitting: Family Medicine

## 2022-11-28 VITALS — BP 120/72 | HR 95 | Temp 97.9°F | Ht 63.5 in | Wt 326.0 lb

## 2022-11-28 DIAGNOSIS — M25552 Pain in left hip: Secondary | ICD-10-CM | POA: Diagnosis not present

## 2022-11-28 DIAGNOSIS — E119 Type 2 diabetes mellitus without complications: Secondary | ICD-10-CM

## 2022-11-28 DIAGNOSIS — F411 Generalized anxiety disorder: Secondary | ICD-10-CM

## 2022-11-28 DIAGNOSIS — F331 Major depressive disorder, recurrent, moderate: Secondary | ICD-10-CM | POA: Diagnosis not present

## 2022-11-28 LAB — POCT GLYCOSYLATED HEMOGLOBIN (HGB A1C): Hemoglobin A1C: 7 % — AB (ref 4.0–5.6)

## 2022-11-28 MED ORDER — PREDNISONE 20 MG PO TABS: ORAL_TABLET | ORAL | 0 refills | Status: DC

## 2022-11-28 MED ORDER — FLUOXETINE HCL 10 MG PO CAPS: 10.0000 mg | ORAL_CAPSULE | Freq: Every day | ORAL | 0 refills | Status: DC

## 2022-12-20 ENCOUNTER — Other Ambulatory Visit: Payer: Self-pay | Admitting: Family Medicine

## 2022-12-20 DIAGNOSIS — F411 Generalized anxiety disorder: Secondary | ICD-10-CM

## 2022-12-20 DIAGNOSIS — F331 Major depressive disorder, recurrent, moderate: Secondary | ICD-10-CM

## 2022-12-26 ENCOUNTER — Other Ambulatory Visit: Payer: Self-pay | Admitting: Family Medicine

## 2023-02-10 ENCOUNTER — Other Ambulatory Visit: Payer: Self-pay | Admitting: Family Medicine

## 2023-02-10 DIAGNOSIS — E0822 Diabetes mellitus due to underlying condition with diabetic chronic kidney disease: Secondary | ICD-10-CM

## 2023-02-22 ENCOUNTER — Other Ambulatory Visit: Payer: Self-pay | Admitting: Family Medicine

## 2023-02-22 DIAGNOSIS — G8929 Other chronic pain: Secondary | ICD-10-CM

## 2023-02-22 NOTE — Telephone Encounter (Signed)
 Last office visit 11/28/22 for Hip Pain.  Last refilled 10/15/2022 for #60 with 3 refills.  Next Appt: No future appointments.

## 2023-03-17 ENCOUNTER — Other Ambulatory Visit: Payer: Self-pay | Admitting: Family Medicine

## 2023-04-03 ENCOUNTER — Telehealth: Payer: Self-pay | Admitting: Family Medicine

## 2023-04-03 NOTE — Telephone Encounter (Signed)
 Please call and schedule office visit with Dr. Patsy Lager to discuss work accommodations and next steps.

## 2023-04-03 NOTE — Telephone Encounter (Signed)
 Patient scheduled.

## 2023-04-03 NOTE — Telephone Encounter (Signed)
 I would like her to come into the office.  I haven't seen her in 6 months.  For work limitations or accomodations, I like to go over the exact wording to be very clear about her needs and how exactly we phrase things for her employer.

## 2023-04-03 NOTE — Telephone Encounter (Signed)
 Copied from CRM [id number]. Topic: Clinical - Medical Advice >> Apr 03, 2023 11:55 AM Saverio Danker wrote: Reason for CRM: Patient is calling because she needs a letter stating that she needs accommodations for work. Patient would like a call to go over the letter and what would be the next steps

## 2023-04-04 ENCOUNTER — Ambulatory Visit: Admitting: Family Medicine

## 2023-04-04 ENCOUNTER — Encounter: Payer: Self-pay | Admitting: Family Medicine

## 2023-04-04 VITALS — BP 110/76 | HR 89 | Temp 98.1°F | Ht 63.5 in | Wt 332.2 lb

## 2023-04-04 DIAGNOSIS — M170 Bilateral primary osteoarthritis of knee: Secondary | ICD-10-CM

## 2023-04-04 DIAGNOSIS — Z7985 Long-term (current) use of injectable non-insulin antidiabetic drugs: Secondary | ICD-10-CM | POA: Diagnosis not present

## 2023-04-04 DIAGNOSIS — E119 Type 2 diabetes mellitus without complications: Secondary | ICD-10-CM | POA: Diagnosis not present

## 2023-04-04 DIAGNOSIS — M160 Bilateral primary osteoarthritis of hip: Secondary | ICD-10-CM

## 2023-04-04 LAB — POCT GLYCOSYLATED HEMOGLOBIN (HGB A1C): Hemoglobin A1C: 7.4 % — AB (ref 4.0–5.6)

## 2023-04-04 MED ORDER — SEMAGLUTIDE (2 MG/DOSE) 8 MG/3ML ~~LOC~~ SOPN: 2.0000 mg | PEN_INJECTOR | SUBCUTANEOUS | 3 refills | Status: AC
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
## 2023-04-20 ENCOUNTER — Encounter: Payer: Self-pay | Admitting: Family Medicine

## 2023-04-29 ENCOUNTER — Encounter: Payer: Self-pay | Admitting: Family Medicine

## 2023-04-30 NOTE — Telephone Encounter (Signed)
 Spoke with patient regarding accomodation paperwork. Placed in providers box for review.

## 2023-04-30 NOTE — Telephone Encounter (Signed)
 Left phone and Mychart message to return call to office. Need to follow up regarding FMLA/disability forms.

## 2023-05-01 DIAGNOSIS — Z0279 Encounter for issue of other medical certificate: Secondary | ICD-10-CM

## 2023-05-01 NOTE — Telephone Encounter (Addendum)
 Completed accomodation forms received. A copy has been sent to scan. Left voicemail and Mychart message letting patient know completed forms as well as a copy for their records are ready to pick up at the front desk.   **Patient requests I mail the forms to her. I will put them in the mail today 5.1.25 and send to address she put in her mychart message**

## 2023-06-13 ENCOUNTER — Encounter: Payer: Self-pay | Admitting: Family Medicine

## 2023-06-16 ENCOUNTER — Other Ambulatory Visit: Payer: Self-pay | Admitting: Family Medicine

## 2023-06-16 DIAGNOSIS — G8929 Other chronic pain: Secondary | ICD-10-CM

## 2023-06-18 NOTE — Telephone Encounter (Signed)
 Last office visit 04/04/23 Last refilled 02/22/23 for #60 with 3 refills.  Next Appt: No future appointments.

## 2023-06-20 ENCOUNTER — Encounter: Payer: Self-pay | Admitting: Family Medicine

## 2023-06-20 MED ORDER — BUPROPION HCL ER (XL) 150 MG PO TB24: 150.0000 mg | ORAL_TABLET | Freq: Every day | ORAL | 2 refills | Status: DC

## 2023-06-20 NOTE — Addendum Note (Signed)
 Addended by: Scherrie Curt on: 06/20/2023 04:41 PM   Modules accepted: Orders

## 2023-06-27 ENCOUNTER — Ambulatory Visit: Admitting: Family Medicine

## 2023-07-12 ENCOUNTER — Other Ambulatory Visit: Payer: Self-pay | Admitting: Family Medicine

## 2023-07-21 NOTE — Progress Notes (Unsigned)
     Kristy Conrad T. [name], MD, CAQ Sports Medicine [facility] at [facility] [address] [place], [address]  Phone: [phone]  FAX: [phone]  Kristy Conrad - 48 y.o. female  MRN [id number]  Date of Birth: 29-Apr-1975  Date: 07/22/2023  PCP: Watt Mirza, MD  Referral: Watt Mirza, MD  No chief complaint on file.  Subjective:   Kristy Conrad is a 48 y.o. very pleasant female patient with There is no height or weight on file to calculate BMI. who presents with the following:  She is here for depression follow-up.  I recently restarted her on Wellbutrin  XL 150 mg a day.    Review of Systems is noted in the HPI, as appropriate  Objective:   There were no vitals taken for this visit.  GEN: No acute distress; alert,appropriate. PULM: Breathing comfortably in no respiratory distress PSYCH: Normally interactive.   Laboratory and Imaging Data:  Assessment and Plan:   ***

## 2023-07-22 ENCOUNTER — Ambulatory Visit: Admitting: Family Medicine

## 2023-07-22 ENCOUNTER — Encounter: Payer: Self-pay | Admitting: Family Medicine

## 2023-07-22 VITALS — BP 120/80 | HR 106 | Temp 97.8°F | Ht 63.5 in | Wt 319.4 lb

## 2023-07-22 DIAGNOSIS — F411 Generalized anxiety disorder: Secondary | ICD-10-CM | POA: Diagnosis not present

## 2023-07-22 DIAGNOSIS — M1610 Unilateral primary osteoarthritis, unspecified hip: Secondary | ICD-10-CM

## 2023-07-22 DIAGNOSIS — F331 Major depressive disorder, recurrent, moderate: Secondary | ICD-10-CM

## 2023-07-22 DIAGNOSIS — M25562 Pain in left knee: Secondary | ICD-10-CM

## 2023-07-22 DIAGNOSIS — M25561 Pain in right knee: Secondary | ICD-10-CM | POA: Diagnosis not present

## 2023-07-22 DIAGNOSIS — R269 Unspecified abnormalities of gait and mobility: Secondary | ICD-10-CM

## 2023-07-22 DIAGNOSIS — G8929 Other chronic pain: Secondary | ICD-10-CM

## 2023-07-22 MED ORDER — ALBUTEROL SULFATE HFA 108 (90 BASE) MCG/ACT IN AERS: 2.0000 | INHALATION_SPRAY | Freq: Four times a day (QID) | RESPIRATORY_TRACT | 2 refills | Status: AC | PRN

## 2023-08-07 ENCOUNTER — Ambulatory Visit

## 2023-08-08 ENCOUNTER — Other Ambulatory Visit: Payer: Self-pay | Admitting: Family Medicine

## 2023-08-08 ENCOUNTER — Other Ambulatory Visit: Payer: Self-pay | Admitting: Cardiovascular Disease

## 2023-08-08 DIAGNOSIS — E0822 Diabetes mellitus due to underlying condition with diabetic chronic kidney disease: Secondary | ICD-10-CM

## 2023-08-08 DIAGNOSIS — E782 Mixed hyperlipidemia: Secondary | ICD-10-CM

## 2023-08-11 ENCOUNTER — Other Ambulatory Visit: Payer: Self-pay | Admitting: Cardiovascular Disease

## 2023-08-11 DIAGNOSIS — E782 Mixed hyperlipidemia: Secondary | ICD-10-CM

## 2023-08-15 ENCOUNTER — Encounter

## 2023-08-15 ENCOUNTER — Other Ambulatory Visit: Payer: Self-pay | Admitting: Family Medicine

## 2023-08-15 NOTE — Telephone Encounter (Signed)
 Patient is due for CPE.  I have changed her upcoming appointment on 09/23/23 to a CPE but can we call and schedule a fasting labs appointment prior to her CPE.

## 2023-08-15 NOTE — Telephone Encounter (Signed)
 Spoke to pt, sch labs on 09/16/23

## 2023-08-21 ENCOUNTER — Encounter

## 2023-08-26 ENCOUNTER — Encounter

## 2023-08-27 ENCOUNTER — Ambulatory Visit

## 2023-08-28 ENCOUNTER — Encounter

## 2023-09-04 ENCOUNTER — Ambulatory Visit

## 2023-09-05 ENCOUNTER — Telehealth: Payer: Self-pay | Admitting: *Deleted

## 2023-09-05 DIAGNOSIS — E1169 Type 2 diabetes mellitus with other specified complication: Secondary | ICD-10-CM

## 2023-09-05 DIAGNOSIS — Z79899 Other long term (current) drug therapy: Secondary | ICD-10-CM

## 2023-09-05 DIAGNOSIS — E119 Type 2 diabetes mellitus without complications: Secondary | ICD-10-CM

## 2023-09-05 NOTE — Telephone Encounter (Signed)
-----   Message from Veva JINNY Ferrari sent at 09/05/2023  9:54 AM EDT ----- Regarding: Lab orders for Mon, 9.15.25 Patient is scheduled for CPX labs, please order future labs, Thanks , Veva

## 2023-09-09 ENCOUNTER — Encounter

## 2023-09-11 ENCOUNTER — Encounter

## 2023-09-16 ENCOUNTER — Other Ambulatory Visit (INDEPENDENT_AMBULATORY_CARE_PROVIDER_SITE_OTHER)

## 2023-09-16 ENCOUNTER — Encounter

## 2023-09-16 DIAGNOSIS — E119 Type 2 diabetes mellitus without complications: Secondary | ICD-10-CM

## 2023-09-16 DIAGNOSIS — Z79899 Other long term (current) drug therapy: Secondary | ICD-10-CM | POA: Diagnosis not present

## 2023-09-16 DIAGNOSIS — E785 Hyperlipidemia, unspecified: Secondary | ICD-10-CM | POA: Diagnosis not present

## 2023-09-16 DIAGNOSIS — E1169 Type 2 diabetes mellitus with other specified complication: Secondary | ICD-10-CM | POA: Diagnosis not present

## 2023-09-16 LAB — CBC WITH DIFFERENTIAL/PLATELET
Basophils Absolute: 0.1 K/uL (ref 0.0–0.1)
Basophils Relative: 1.4 % (ref 0.0–3.0)
Eosinophils Absolute: 0.5 K/uL (ref 0.0–0.7)
Eosinophils Relative: 5.9 % — ABNORMAL HIGH (ref 0.0–5.0)
HCT: 38.1 % (ref 36.0–46.0)
Hemoglobin: 12.8 g/dL (ref 12.0–15.0)
Lymphocytes Relative: 27 % (ref 12.0–46.0)
Lymphs Abs: 2.2 K/uL (ref 0.7–4.0)
MCHC: 33.5 g/dL (ref 30.0–36.0)
MCV: 82.1 fl (ref 78.0–100.0)
Monocytes Absolute: 0.6 K/uL (ref 0.1–1.0)
Monocytes Relative: 7.5 % (ref 3.0–12.0)
Neutro Abs: 4.7 K/uL (ref 1.4–7.7)
Neutrophils Relative %: 58.2 % (ref 43.0–77.0)
Platelets: 363 K/uL (ref 150.0–400.0)
RBC: 4.64 Mil/uL (ref 3.87–5.11)
RDW: 14 % (ref 11.5–15.5)
WBC: 8 K/uL (ref 4.0–10.5)

## 2023-09-16 LAB — HEPATIC FUNCTION PANEL
ALT: 23 U/L (ref 0–35)
AST: 25 U/L (ref 0–37)
Albumin: 4 g/dL (ref 3.5–5.2)
Alkaline Phosphatase: 48 U/L (ref 39–117)
Bilirubin, Direct: 0.1 mg/dL (ref 0.0–0.3)
Total Bilirubin: 0.4 mg/dL (ref 0.2–1.2)
Total Protein: 7.2 g/dL (ref 6.0–8.3)

## 2023-09-16 LAB — TSH: TSH: 3.09 u[IU]/mL (ref 0.35–5.50)

## 2023-09-16 LAB — LIPID PANEL
Cholesterol: 86 mg/dL (ref 0–200)
HDL: 29.6 mg/dL — ABNORMAL LOW (ref >39.00)
LDL Cholesterol: 33 mg/dL (ref 0–99)
NonHDL: 56.49
Total CHOL/HDL Ratio: 3
Triglycerides: 115 mg/dL (ref 0.0–149.0)
VLDL: 23 mg/dL (ref 0.0–40.0)

## 2023-09-16 LAB — BASIC METABOLIC PANEL WITH GFR
BUN: 12 mg/dL (ref 6–23)
CO2: 27 meq/L (ref 19–32)
Calcium: 9.5 mg/dL (ref 8.4–10.5)
Chloride: 101 meq/L (ref 96–112)
Creatinine, Ser: 0.82 mg/dL (ref 0.40–1.20)
GFR: 83.92 mL/min (ref >60.00)
Glucose, Bld: 155 mg/dL — ABNORMAL HIGH (ref 70–99)
Potassium: 4.3 meq/L (ref 3.5–5.1)
Sodium: 137 meq/L (ref 135–145)

## 2023-09-16 LAB — MICROALBUMIN / CREATININE URINE RATIO
Creatinine,U: 156.1 mg/dL
Microalb Creat Ratio: 93 mg/g — ABNORMAL HIGH (ref 0.0–30.0)
Microalb, Ur: 14.5 mg/dL — ABNORMAL HIGH (ref 0.0–1.9)

## 2023-09-16 LAB — HEMOGLOBIN A1C: Hgb A1c MFr Bld: 8.3 % — ABNORMAL HIGH (ref 4.6–6.5)

## 2023-09-18 ENCOUNTER — Encounter

## 2023-09-22 NOTE — Progress Notes (Signed)
 "    Kristy Batson T. Marvell Tamer, MD, CAQ Sports Medicine [facility] at [facility] [address] [place], [address]  Phone: [phone]  FAX: [phone]  Kristy Conrad - 48 y.o. female  MRN [id number]  Date of Birth: 1975-09-27  Date: 09/23/2023  PCP: Watt Mirza, MD  Referral: Watt Mirza, MD  Chief Complaint  Patient presents with   Annual Exam   Patient Care Team: Watt Mirza, MD as PCP - General (Family Medicine) Subjective:   Kristy Conrad is a 48 y.o. pleasant patient who presents with the following:  Discussed the use of AI scribe software for clinical note transcription with the patient, who gave verbal consent to proceed.  History of Present Illness Kristy Conrad is a 48 year old female with type 2 diabetes who presents for CPX:  She is facing challenges in arranging physical therapy due to scheduling conflicts with her work in [place]. The local facility on [facility] has no openings except for cancellations, which are not feasible for her schedule. She plans to explore physical therapy options in [place] or [place] once her work schedule stabilizes.  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 She acknowledges consuming high-carb foods and has taken steps to remove these from her environment. She is currently on 2 mg of Ozempic  and 1000 mg of metformin  twice daily. Occasionally, she experiences gastrointestinal side effects from Ozempic , such as diarrhea, but not nausea, and manages this by temporarily pausing the medication.  She has not had a colon cancer screening recently but received a Cologuard kit last week. She missed her mammogram last year but plans to schedule one in November. Her last eye exam was in December, and she has another scheduled for this December. No diabetic retinopathy was reported in her last eye  exam.  She is up to date with her vaccinations, having received the Pneumovax 23 and is planning to get the Prevnar 20 booster. She is also planning to get a flu shot.  She does not smoke or use recreational drugs and consumes alcohol very infrequently, about one or two mixed drinks a year. She engages in regular physical activity, participating in water aerobics four to five times a week, and occasionally teaches classes. She experiences some anxiety related to work but manages it with regular exercise and maintaining a routine.  Health Maintenance Summary Reviewed and updated, unless pt declines services.  Tobacco History Reviewed. Non-smoker Alcohol: No concerns, no excessive use Exercise Habits: Some activity, rec at least 30 mins 5 times a week -she works out in the pool about 4 or 5 days a week STD concerns: none Drug Use: None Lumps or breast concerns: no  Prevnar 20 Colonoscopy - cologuard pending Mammogram - not up to date, GYN Eye exam  12/2022 Foot exam Flu shot COVID booster  Diabetes Mellitus: Tolerating Medications: yes Compliance with diet: fair, Body mass index is 55.98 kg/m. Exercise: minimal / intermittent Avg blood sugars at home: not checking Foot problems: none Hypoglycemia: none No nausea, vomitting, blurred vision, polyuria.  Lab Results  Component Value Date   HGBA1C 8.3 (H) 09/16/2023   HGBA1C 7.4 (A) 04/04/2023   HGBA1C 7.0 (A) 11/28/2022   Lab Results  Component Value Date   MICROALBUR 14.5 (H) 09/16/2023   LDLCALC 33 09/16/2023   CREATININE 0.82 09/16/2023    Wt Readings from Last 3 Encounters:  09/23/23 (!) 313 lb 8 oz (142.2 kg)  07/22/23 (!) 319 lb 6 oz (144.9 kg)  04/04/23 (!) 332 lb 4 oz (150.7 kg)     Health Maintenance  Topic Date Due   Hepatitis B Vaccines 19-59 Average Risk (1 of 3 - 19+ 3-dose series) Never done   Colonoscopy  Never done   Mammogram  07/28/2022   OPHTHALMOLOGY EXAM  10/05/2022   FOOT EXAM  07/02/2023    COVID-19 Vaccine (4 - 2025-26 season) 09/02/2023   HEMOGLOBIN A1C  03/15/2024   Diabetic kidney evaluation - eGFR measurement  09/15/2024   Diabetic kidney evaluation - Urine ACR  09/15/2024   Cervical Cancer Screening (HPV/Pap Cotest)  07/29/2025   Pneumococcal Vaccine  Completed   Influenza Vaccine  Completed   Hepatitis C Screening  Completed   HIV Screening  Completed   HPV VACCINES  Aged Out   Meningococcal B Vaccine  Aged Out   DTaP/Tdap/Td  Discontinued    Immunization History  Administered Date(s) Administered   Influenza Split 10/27/2010, 11/12/2011, 11/06/2014   Influenza Whole 10/12/2009   Influenza, Seasonal, Injecte, Preservative Fre 09/23/2023   Influenza,inj,Quad PF,6+ Mos 10/22/2012, 09/25/2013, 11/08/2018, 12/28/2019, 01/04/2021, 09/28/2021   Influenza-Unspecified 11/18/2015, 10/23/2017   PFIZER(Purple Top)SARS-COV-2 Vaccination 05/08/2019, 06/09/2019, 04/01/2020   PNEUMOCOCCAL CONJUGATE-20 09/23/2023   PPD Test 10/27/2010, 02/19/2022   Pneumococcal Polysaccharide-23 10/20/2009, 11/01/2015   Td 07/07/2008   Tdap 07/02/2022   Patient Active Problem List   Diagnosis Date Noted   Diabetes mellitus treated with oral medication (HCC) 07/02/2022    Priority: High   Pulmonary embolism (HCC) 12/28/2019    Priority: High   Generalized anxiety disorder 02/23/2019    Priority: Medium    OSA (obstructive sleep apnea) 09/15/2012    Priority: Medium    Obesity, morbid, BMI 50 or higher (HCC) 11/12/2011    Priority: Medium    DEPRESSION, MAJOR, RECURRENT, MODERATE 07/08/2009    Priority: Medium    COVID-19 long hauler manifesting chronic concentration deficit 04/05/2020   POLYCYSTIC OVARIES 07/08/2009    Past Medical History:  Diagnosis Date   Depression    Diabetes mellitus    Hyperlipidemia    Hypertension    Obesity    PCOS (polycystic ovarian syndrome)    Pulmonary embolism (HCC)     Past Surgical History:  Procedure Laterality Date   BREAST SURGERY      breast reduction   CHOLECYSTECTOMY     WISDOM TOOTH EXTRACTION      Family History  Problem Relation Age of Onset   Heart attack Father 75   Cancer Mother 73       adenoid cystic carcinoma   Emphysema Paternal Grandfather    Cancer Paternal Grandfather        not sure what type   Emphysema Maternal Grandfather    Lung cancer Maternal Grandfather    Heart disease Maternal Grandmother     Social History   Social History Narrative   Regular exercise: no   Caffeine use: daily, tea   06/16/19   From: [place]   Living: alone, 2 kittens   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 every morning and  also teaching the class   Diet: not good, does not follow diabetic diet      Safety   Seat belts: Yes    Guns: No   Safe in relationships: Yes     Past Medical History, Surgical History, Social History, Family History, Problem List, Medications, and Allergies have been reviewed and updated if relevant.  Review of Systems: Pertinent positives are listed above.  Otherwise, a full 14 point review of systems has been done in full and it is negative except where it is noted positive.  Objective:   BP (!) 140/86   Pulse 92   Temp (!) 97.3 F (36.3 C) (Temporal)   Ht 5' 2.75 (1.594 m)   Wt (!) 313 lb 8 oz (142.2 kg)   SpO2 97%   BMI 55.98 kg/m  Ideal Body Weight: Weight in (lb) to have BMI = 25: 139.7 No results found.    09/23/2023    4:03 PM 07/22/2023    3:59 PM 04/04/2023   11:10 AM 11/28/2022    2:10 PM 07/02/2022    8:23 AM  Depression screen PHQ 2/9  Decreased Interest 0 0 0 0 0  Down, Depressed, Hopeless 0 0 0 0 0  PHQ - 2 Score 0 0 0 0 0  Altered sleeping  1 0 2 3  Tired, decreased energy  0 0 1 3  Change in  appetite  0 0 0 0  Feeling bad or failure about yourself   0 0 0 0  Trouble concentrating  0 0 0 0  Moving slowly or fidgety/restless  0 0 3 0  Suicidal thoughts  0 0 0 0  PHQ-9 Score  1 0 6 6  Difficult doing work/chores  Somewhat difficult Not difficult at all Very difficult Not difficult at all     GEN: well developed, well nourished, no acute distress Eyes: conjunctiva and lids normal, PERRLA, EOMI ENT: TM clear, nares clear, oral exam WNL Neck: supple, no lymphadenopathy, no thyromegaly, no JVD Pulm: clear to auscultation and percussion, respiratory effort normal CV: regular rate and rhythm, S1-S2, no murmur, rub or gallop, no bruits Chest: no scars, masses, no lumps BREAST: breast exam declined GI: soft, non-tender; no hepatosplenomegaly, masses; active bowel sounds all quadrants GU: GU exam declined Lymph: no cervical, axillary or inguinal adenopathy MSK: gait normal, muscle tone and strength WNL, no joint swelling, effusions, discoloration, crepitus  SKIN: clear, good turgor, color WNL, no rashes, lesions, or ulcerations Neuro: normal mental status, normal strength, sensation, and motion Psych: alert; oriented to person, place and time, normally interactive and not anxious or depressed in appearance.   All labs reviewed with patient. Results for orders placed or performed in visit on 09/16/23  Microalbumin / creatinine urine ratio   Collection Time: 09/16/23  8:31 AM  Result Value Ref Range   Microalb, Ur 14.5 (H) 0.0 - 1.9 mg/dL   Creatinine,U 843.8 mg/dL   Microalb Creat Ratio 93.0 (H) 0.0 - 30.0 mg/g  Basic metabolic panel   Collection Time: 09/16/23  8:31 AM  Result Value Ref Range   Sodium 137 135 - 145 mEq/L   Potassium 4.3 3.5 - 5.1 mEq/L   Chloride 101 96 - 112 mEq/L   CO2 27 19 - 32 mEq/L   Glucose, Bld 155 (H) 70 - 99 mg/dL   BUN 12 6 - 23 mg/dL   Creatinine, Ser 9.17 0.40 - 1.20 mg/dL   GFR 16.07 >39.99 mL/min   Calcium  9.5  8.4 - 10.5 mg/dL  CBC with  Differential/Platelet   Collection Time: 09/16/23  8:31 AM  Result Value Ref Range   WBC 8.0 4.0 - 10.5 K/uL   RBC 4.64 3.87 - 5.11 Mil/uL   Hemoglobin 12.8 12.0 - 15.0 g/dL   HCT 61.8 63.9 - 53.9 %   MCV 82.1 78.0 - 100.0 fl   MCHC 33.5 30.0 - 36.0 g/dL   RDW 85.9 88.4 - 84.4 %   Platelets 363.0 150.0 - 400.0 K/uL   Neutrophils Relative % 58.2 43.0 - 77.0 %   Lymphocytes Relative 27.0 12.0 - 46.0 %   Monocytes Relative 7.5 3.0 - 12.0 %   Eosinophils Relative 5.9 (H) 0.0 - 5.0 %   Basophils Relative 1.4 0.0 - 3.0 %   Neutro Abs 4.7 1.4 - 7.7 K/uL   Lymphs Abs 2.2 0.7 - 4.0 K/uL   Monocytes Absolute 0.6 0.1 - 1.0 K/uL   Eosinophils Absolute 0.5 0.0 - 0.7 K/uL   Basophils Absolute 0.1 0.0 - 0.1 K/uL  Hepatic function panel   Collection Time: 09/16/23  8:31 AM  Result Value Ref Range   Total Bilirubin 0.4 0.2 - 1.2 mg/dL   Bilirubin, Direct 0.1 0.0 - 0.3 mg/dL   Alkaline Phosphatase 48 39 - 117 U/L   AST 25 0 - 37 U/L   ALT 23 0 - 35 U/L   Total Protein 7.2 6.0 - 8.3 g/dL   Albumin 4.0 3.5 - 5.2 g/dL  Hemoglobin J8r   Collection Time: 09/16/23  8:31 AM  Result Value Ref Range   Hgb A1c MFr Bld 8.3 (H) 4.6 - 6.5 %  Lipid panel   Collection Time: 09/16/23  8:31 AM  Result Value Ref Range   Cholesterol 86 0 - 200 mg/dL   Triglycerides 884.9 0.0 - 149.0 mg/dL   HDL 70.39 (L) >60.99 mg/dL   VLDL 76.9 0.0 - 59.9 mg/dL   LDL Cholesterol 33 0 - 99 mg/dL   Total CHOL/HDL Ratio 3    NonHDL 56.49   TSH   Collection Time: 09/16/23  8:31 AM  Result Value Ref Range   TSH 3.09 0.35 - 5.50 uIU/mL   No results found.  Assessment and Plan:     ICD-10-CM   1. Healthcare maintenance  Z00.00     2. Diabetes mellitus treated with oral medication (HCC)  E11.9    Z79.84     3. Need for influenza vaccination  Z23 Flu vaccine trivalent PF, 6mos and older(Flulaval,Afluria,Fluarix,Fluzone)    4. Need for vaccination against Streptococcus pneumoniae  Z23 Pneumococcal conjugate vaccine  20-valent (Prevnar 20)     Assessment & Plan Health maintenance and general physical exam. She will get a Prevnar 20 today as well as a flu vaccine She notes that she has not been doing a very good job with her eating habit, and she is going to try to make some changes to increase the stability of her diabetes  Type 2 diabetes mellitus Blood sugar at 8.3% due to stress eating and high-carb intake. On maximum Semaglutide  and Metformin . Occasional diarrhea from Semaglutide  improved. - Encourage dietary modifications to reduce high-carb intake. - Continue Semaglutide  2 mg subcutaneously weekly. - Continue Metformin  1000 mg oral bid. - Re-evaluate blood sugar levels in 3 months.  Generalized anxiety disorder and depression Increased anxiety from work stress. Depression managed with exercise and mood awareness. - Continue current management strategies, including regular exercise and awareness of mood changes.  Chronic pain of  both knees and primary osteoarthritis of hip with gait abnormality Physical therapy not initiated due to logistical issues. Water aerobics beneficial for joint health and pain management. - Explore physical therapy options in [place] or [place] and initiate therapy when feasible. - Continue water aerobics for joint health and pain management.  Health Maintenance Exam: The patient's preventative maintenance and recommended screening tests for an annual wellness exam were reviewed in full today. Brought up to date unless services declined.  Counselled on the importance of diet, exercise, and its role in overall health and mortality. The patient's FH and SH was reviewed, including their home life, tobacco status, and drug and alcohol status.  Follow-up in 1 year for physical exam or additional follow-up below.  Disposition: Return in about 3 months (around 12/23/2023) for diabetes.  Future Appointments  Date Time Provider Department Center  12/30/2023  2:00 PM  Mercury Rock, Jacques, MD [facility] [id number]     No orders of the defined types were placed in this encounter.  There are no discontinued medications. Orders Placed This Encounter  Procedures   Flu vaccine trivalent PF, 6mos and older(Flulaval,Afluria,Fluarix,Fluzone)   Pneumococcal conjugate vaccine 20-valent (Prevnar 20)    Signed,  Dinah Lupa T. Zeb Rawl, MD   Allergies as of 09/23/2023       Reactions   Morphine    REACTION: vomiting        Medication List        Accurate as of September 23, 2023 11:59 PM. If you have any questions, ask your nurse or doctor.          albuterol  108 (90 Base) MCG/ACT inhaler Commonly known as: VENTOLIN  HFA Inhale 2 puffs into the lungs every 6 (six) hours as needed for wheezing or shortness of breath.   buPROPion  150 MG 24 hr tablet Commonly known as: WELLBUTRIN  XL TAKE 1 TABLET BY MOUTH EVERY DAY   COLLAGEN PO Take by mouth.   diclofenac  75 MG EC tablet Commonly known as: VOLTAREN  TAKE 1 TABLET BY MOUTH 2 TIMES DAILY AS NEEDED FOR MODERATE PAIN. NEW PCP NEEDED FOR FURTHER REFILLS   folic acid 1 MG tablet Commonly known as: FOLVITE Take 1 mg by mouth daily.   lisinopril  5 MG tablet Commonly known as: ZESTRIL  TAKE 1 TABLET (5 MG TOTAL) BY MOUTH DAILY.   metFORMIN  1000 MG tablet Commonly known as: GLUCOPHAGE  TAKE 1 TABLET (1,000 MG TOTAL) BY MOUTH TWICE A DAY WITH FOOD   rosuvastatin  10 MG tablet Commonly known as: CRESTOR  TAKE 1 TABLET BY MOUTH EVERY DAY   Semaglutide  (2 MG/DOSE) 8 MG/3ML Sopn Inject 2 mg as directed once a week.   Turmeric Curcumin 500 MG Caps Take 3 capsules by mouth daily.   Womens 50+ Multi Vitamin Tabs Take 1 tablet by mouth daily.        "

## 2023-09-23 ENCOUNTER — Encounter: Payer: Self-pay | Admitting: Family Medicine

## 2023-09-23 ENCOUNTER — Encounter

## 2023-09-23 ENCOUNTER — Ambulatory Visit (INDEPENDENT_AMBULATORY_CARE_PROVIDER_SITE_OTHER): Admitting: Family Medicine

## 2023-09-23 VITALS — BP 140/86 | HR 92 | Temp 97.3°F | Ht 62.75 in | Wt 313.5 lb

## 2023-09-23 DIAGNOSIS — Z23 Encounter for immunization: Secondary | ICD-10-CM

## 2023-09-23 DIAGNOSIS — Z0000 Encounter for general adult medical examination without abnormal findings: Secondary | ICD-10-CM | POA: Diagnosis not present

## 2023-09-23 DIAGNOSIS — E119 Type 2 diabetes mellitus without complications: Secondary | ICD-10-CM

## 2023-09-23 DIAGNOSIS — Z7984 Long term (current) use of oral hypoglycemic drugs: Secondary | ICD-10-CM | POA: Diagnosis not present

## 2023-09-23 NOTE — Patient Instructions (Signed)
  You do not need a referral to make a mammogram appointment, and you may call to make her own mammogram appointment directly around your schedule.  MAMMOGRAPHY IN [place]:  [facility] [phone] [address] [place], [place] [address]  [facility] ([facility]) [address] [place], [place] [address] Phone: [phone] Toll Free: [phone]  MAMMOGRAPHY IN [place]:  [facility] ([place] or [place]) [phone] Located on the campus of [facility] ([place])  [facility] [facility]) [address]  [place], [place] [address]  

## 2023-09-24 MED ORDER — COVID-19 MRNA VAC-TRIS(PFIZER) 30 MCG/0.3ML IM SUSY: 0.3000 mL | PREFILLED_SYRINGE | Freq: Once | INTRAMUSCULAR | 0 refills | Status: AC

## 2023-09-24 NOTE — Addendum Note (Signed)
 Addended by: WENDELL ARLAND RAMAN on: 09/24/2023 08:44 AM   Modules accepted: Orders

## 2023-09-25 ENCOUNTER — Encounter

## 2023-09-30 ENCOUNTER — Encounter

## 2023-10-12 ENCOUNTER — Other Ambulatory Visit: Payer: Self-pay | Admitting: Family Medicine

## 2023-10-19 ENCOUNTER — Other Ambulatory Visit: Payer: Self-pay | Admitting: Family Medicine

## 2023-10-19 DIAGNOSIS — G8929 Other chronic pain: Secondary | ICD-10-CM

## 2023-10-21 NOTE — Telephone Encounter (Signed)
 Last office visit 09/23/23 for CPE.  Last refilled 06/18/23 for #60 with 3 refills.  Next Appt: 12/30/2023 for DM.

## 2023-10-29 ENCOUNTER — Encounter: Payer: Self-pay | Admitting: Family Medicine

## 2023-11-05 ENCOUNTER — Other Ambulatory Visit: Payer: Self-pay | Admitting: Family Medicine

## 2023-11-05 DIAGNOSIS — E0822 Diabetes mellitus due to underlying condition with diabetic chronic kidney disease: Secondary | ICD-10-CM

## 2023-11-12 ENCOUNTER — Other Ambulatory Visit: Payer: Self-pay | Admitting: Family Medicine

## 2023-12-29 NOTE — Progress Notes (Unsigned)
 "    Jerrelle Michelsen T. Sesilia Poucher, MD, CAQ Sports Medicine [facility] at [facility] [address] [place], [address]  Phone: [phone]  FAX: [phone]  Kristy Conrad - 48 y.o. female  MRN [id number]  Date of Birth: June 05, 1975  Date: 12/30/2023  PCP: Watt Mirza, MD  Referral: Watt Mirza, MD  No chief complaint on file.  Subjective:   Kristy Conrad is a 48 y.o. very pleasant female patient with There is no height or weight on file to calculate BMI. who presents with the following:  Discussed the use of AI scribe software for clinical note transcription with the patient, who gave verbal consent to proceed.  Patient presents for diabetes follow-up.  She is currently on Ozempic  2 mg as well as metformin  1000 mg twice daily.  Diabetes Mellitus: Tolerating Medications: yes Compliance with diet: fair, There is no height or weight on file to calculate BMI. Exercise: minimal / intermittent Avg blood sugars at home: not checking Foot problems: none Hypoglycemia: none No nausea, vomitting, blurred vision, polyuria.  Lab Results  Component Value Date   HGBA1C 8.3 (H) 09/16/2023   HGBA1C 7.4 (A) 04/04/2023   HGBA1C 7.0 (A) 11/28/2022   Lab Results  Component Value Date   MICROALBUR 14.5 (H) 09/16/2023   LDLCALC 33 09/16/2023   CREATININE 0.82 09/16/2023    Wt Readings from Last 3 Encounters:  09/23/23 (!) 313 lb 8 oz (142.2 kg)  07/22/23 (!) 319 lb 6 oz (144.9 kg)  04/04/23 (!) 332 lb 4 oz (150.7 kg)    History of Present Illness     Review of Systems is noted in the HPI, as appropriate  Objective:   There were no vitals taken for this visit.  GEN: No acute distress; alert,appropriate. PULM: Breathing comfortably in no respiratory distress PSYCH: Normally interactive.   Laboratory and Imaging Data:  Assessment and Plan:   No diagnosis found. Assessment & Plan   Medication Management during today's office visit: No  orders of the defined types were placed in this encounter.  There are no discontinued medications.  Orders placed today for conditions managed today: No orders of the defined types were placed in this encounter.   Disposition: No follow-ups on file.  [employer] speech-to-text software was used for transcription in this dictation.  Possible transcriptional errors can occur using Animal nutritionist.   Signed,  Mirza DASEN. Sonny Anthes, MD   Outpatient Encounter Medications as of 12/30/2023  Medication Sig   albuterol  (VENTOLIN  HFA) 108 (90 Base) MCG/ACT inhaler Inhale 2 puffs into the lungs every 6 (six) hours as needed for wheezing or shortness of breath.   buPROPion  (WELLBUTRIN  XL) 150 MG 24 hr tablet TAKE 1 TABLET BY MOUTH EVERY DAY   COLLAGEN PO Take by mouth.   diclofenac  (VOLTAREN ) 75 MG EC tablet TAKE 1 TABLET BY MOUTH 2 TIMES DAILY AS NEEDED FOR MODERATE PAIN. NEW PCP NEEDED FOR FURTHER REFILLS   folic acid (FOLVITE) 1 MG tablet Take 1 mg by mouth daily.   lisinopril  (ZESTRIL ) 5 MG tablet TAKE 1 TABLET (5 MG TOTAL) BY MOUTH DAILY.   metFORMIN  (GLUCOPHAGE ) 1000 MG tablet TAKE 1 TABLET (1,000 MG TOTAL) BY MOUTH TWICE A DAY WITH FOOD   Multiple Vitamins-Minerals (WOMENS 50+ MULTI VITAMIN) TABS Take 1 tablet by mouth daily.   rosuvastatin  (CRESTOR ) 10 MG tablet TAKE 1 TABLET BY MOUTH EVERY DAY   Semaglutide , 2 MG/DOSE, 8 MG/3ML SOPN Inject 2 mg as directed once  a week.   Turmeric Curcumin 500 MG CAPS Take 3 capsules by mouth daily.    No facility-administered encounter medications on file as of 12/30/2023.   "

## 2023-12-30 ENCOUNTER — Other Ambulatory Visit: Payer: Self-pay | Admitting: Cardiovascular Disease

## 2023-12-30 ENCOUNTER — Ambulatory Visit: Admitting: Family Medicine

## 2023-12-30 ENCOUNTER — Encounter: Payer: Self-pay | Admitting: Family Medicine

## 2023-12-30 VITALS — BP 128/80 | HR 99 | Temp 97.8°F | Ht 62.75 in | Wt 310.0 lb

## 2023-12-30 DIAGNOSIS — Z7985 Long-term (current) use of injectable non-insulin antidiabetic drugs: Secondary | ICD-10-CM

## 2023-12-30 DIAGNOSIS — Z7984 Long term (current) use of oral hypoglycemic drugs: Secondary | ICD-10-CM | POA: Diagnosis not present

## 2023-12-30 DIAGNOSIS — E782 Mixed hyperlipidemia: Secondary | ICD-10-CM

## 2023-12-30 DIAGNOSIS — E119 Type 2 diabetes mellitus without complications: Secondary | ICD-10-CM | POA: Diagnosis not present

## 2023-12-30 LAB — POCT GLYCOSYLATED HEMOGLOBIN (HGB A1C): Hemoglobin A1C: 7.1 % — AB (ref 4.0–5.6)

## 2023-12-30 MED ORDER — FLUOXETINE HCL 20 MG PO CAPS: 20.0000 mg | ORAL_CAPSULE | Freq: Every day | ORAL | 3 refills | Status: AC

## 2024-01-08 ENCOUNTER — Encounter: Payer: Self-pay | Admitting: Family Medicine

## 2024-01-08 DIAGNOSIS — E782 Mixed hyperlipidemia: Secondary | ICD-10-CM

## 2024-01-09 MED ORDER — ROSUVASTATIN CALCIUM 10 MG PO TABS: 10.0000 mg | ORAL_TABLET | Freq: Every day | ORAL | 2 refills | Status: AC
